# Patient Record
Sex: Male | Born: 1945
Health system: Southern US, Community
[De-identification: ages and names within clinical notes are randomized; demographics above are authoritative.]

## PROBLEM LIST (undated history)

## (undated) DIAGNOSIS — I10 Essential (primary) hypertension: Secondary | ICD-10-CM

## (undated) HISTORY — DX: Essential (primary) hypertension: I10

## (undated) HISTORY — PX: HERNIA REPAIR: SHX51

## (undated) HISTORY — PX: VASECTOMY: SHX75

---

## 2005-11-02 ENCOUNTER — Ambulatory Visit: Payer: Self-pay | Admitting: Ophthalmology

## 2006-07-04 ENCOUNTER — Emergency Department: Payer: Self-pay | Admitting: Emergency Medicine

## 2008-03-04 ENCOUNTER — Emergency Department: Payer: Self-pay | Admitting: Unknown Physician Specialty

## 2009-05-20 ENCOUNTER — Ambulatory Visit: Payer: Self-pay | Admitting: Ophthalmology

## 2009-06-01 ENCOUNTER — Ambulatory Visit: Payer: Self-pay | Admitting: Ophthalmology

## 2010-09-22 ENCOUNTER — Emergency Department: Payer: Self-pay | Admitting: Emergency Medicine

## 2013-07-12 DIAGNOSIS — I10 Essential (primary) hypertension: Secondary | ICD-10-CM | POA: Diagnosis not present

## 2013-08-30 DIAGNOSIS — N182 Chronic kidney disease, stage 2 (mild): Secondary | ICD-10-CM | POA: Diagnosis not present

## 2013-08-30 DIAGNOSIS — I129 Hypertensive chronic kidney disease with stage 1 through stage 4 chronic kidney disease, or unspecified chronic kidney disease: Secondary | ICD-10-CM | POA: Diagnosis not present

## 2013-11-13 DIAGNOSIS — I10 Essential (primary) hypertension: Secondary | ICD-10-CM | POA: Diagnosis not present

## 2014-04-28 DIAGNOSIS — K469 Unspecified abdominal hernia without obstruction or gangrene: Secondary | ICD-10-CM | POA: Diagnosis not present

## 2014-05-22 DIAGNOSIS — K4020 Bilateral inguinal hernia, without obstruction or gangrene, not specified as recurrent: Secondary | ICD-10-CM | POA: Diagnosis not present

## 2014-06-02 ENCOUNTER — Ambulatory Visit: Payer: Self-pay | Admitting: Unknown Physician Specialty

## 2014-06-02 DIAGNOSIS — K4090 Unilateral inguinal hernia, without obstruction or gangrene, not specified as recurrent: Secondary | ICD-10-CM | POA: Diagnosis not present

## 2014-06-02 DIAGNOSIS — Z01810 Encounter for preprocedural cardiovascular examination: Secondary | ICD-10-CM | POA: Diagnosis not present

## 2014-06-02 DIAGNOSIS — I10 Essential (primary) hypertension: Secondary | ICD-10-CM | POA: Diagnosis not present

## 2014-06-04 DIAGNOSIS — I517 Cardiomegaly: Secondary | ICD-10-CM | POA: Diagnosis not present

## 2014-06-04 DIAGNOSIS — M720 Palmar fascial fibromatosis [Dupuytren]: Secondary | ICD-10-CM | POA: Diagnosis not present

## 2014-06-04 DIAGNOSIS — R9431 Abnormal electrocardiogram [ECG] [EKG]: Secondary | ICD-10-CM | POA: Diagnosis not present

## 2014-06-04 DIAGNOSIS — R58 Hemorrhage, not elsewhere classified: Secondary | ICD-10-CM | POA: Diagnosis not present

## 2014-06-04 DIAGNOSIS — I10 Essential (primary) hypertension: Secondary | ICD-10-CM | POA: Diagnosis not present

## 2014-06-04 DIAGNOSIS — F17200 Nicotine dependence, unspecified, uncomplicated: Secondary | ICD-10-CM | POA: Diagnosis not present

## 2014-06-04 DIAGNOSIS — K4030 Unilateral inguinal hernia, with obstruction, without gangrene, not specified as recurrent: Secondary | ICD-10-CM | POA: Diagnosis not present

## 2014-06-04 DIAGNOSIS — L538 Other specified erythematous conditions: Secondary | ICD-10-CM | POA: Diagnosis not present

## 2014-06-05 DIAGNOSIS — I10 Essential (primary) hypertension: Secondary | ICD-10-CM | POA: Diagnosis not present

## 2014-06-05 DIAGNOSIS — R58 Hemorrhage, not elsewhere classified: Secondary | ICD-10-CM | POA: Diagnosis not present

## 2014-06-05 DIAGNOSIS — R0602 Shortness of breath: Secondary | ICD-10-CM | POA: Diagnosis not present

## 2014-06-05 DIAGNOSIS — M720 Palmar fascial fibromatosis [Dupuytren]: Secondary | ICD-10-CM | POA: Diagnosis not present

## 2014-06-05 DIAGNOSIS — I517 Cardiomegaly: Secondary | ICD-10-CM | POA: Diagnosis not present

## 2014-06-05 DIAGNOSIS — F17200 Nicotine dependence, unspecified, uncomplicated: Secondary | ICD-10-CM | POA: Diagnosis not present

## 2014-06-05 DIAGNOSIS — L538 Other specified erythematous conditions: Secondary | ICD-10-CM | POA: Diagnosis not present

## 2014-06-05 DIAGNOSIS — R9431 Abnormal electrocardiogram [ECG] [EKG]: Secondary | ICD-10-CM | POA: Diagnosis not present

## 2014-07-07 ENCOUNTER — Ambulatory Visit: Payer: Self-pay | Admitting: Surgery

## 2014-07-07 DIAGNOSIS — Z9852 Vasectomy status: Secondary | ICD-10-CM | POA: Diagnosis not present

## 2014-07-07 DIAGNOSIS — Z9841 Cataract extraction status, right eye: Secondary | ICD-10-CM | POA: Diagnosis not present

## 2014-07-07 DIAGNOSIS — F17200 Nicotine dependence, unspecified, uncomplicated: Secondary | ICD-10-CM | POA: Diagnosis not present

## 2014-07-07 DIAGNOSIS — I10 Essential (primary) hypertension: Secondary | ICD-10-CM | POA: Diagnosis not present

## 2014-07-07 DIAGNOSIS — Z9842 Cataract extraction status, left eye: Secondary | ICD-10-CM | POA: Diagnosis not present

## 2014-07-07 DIAGNOSIS — Z79899 Other long term (current) drug therapy: Secondary | ICD-10-CM | POA: Diagnosis not present

## 2014-07-07 DIAGNOSIS — Z7982 Long term (current) use of aspirin: Secondary | ICD-10-CM | POA: Diagnosis not present

## 2014-07-07 DIAGNOSIS — K4090 Unilateral inguinal hernia, without obstruction or gangrene, not specified as recurrent: Secondary | ICD-10-CM | POA: Diagnosis not present

## 2014-09-14 NOTE — Op Note (Signed)
PATIENT NAME:  Brandon Hart, Brandon Hart MR#:  [id number] DATE OF BIRTH:  14-Jun-1945  DATE OF PROCEDURE:  07/07/2014  PREOPERATIVE DIAGNOSIS: Left inguinal hernia.   POSTOPERATIVE DIAGNOSIS: Left inguinal hernia (indirect with very weak inguinal floor).   OPERATION PERFORMED: Left inguinal hernia repair Karl Pock) with mesh.   SURGEON: Consuela Mimes, M.D.   ANESTHESIA: General.   PROCEDURE IN DETAIL: The patient was placed supine on the Operating Room table and prepped and draped in the usual sterile fashion. An incision was made over the inguinal canal in the lines of the skin on the left side and carried down through Scarpa's fascia to the external oblique aponeurosis which was opened in the direction of its fibers to the external inguinal ring. The spermatic cord was elevated and incised proximally and a fairly long indirect hernia sac was located and dissected free of surrounding cord structures to a level very high in the inguinal canal. It was dunked back within the abdominal wall. A regular size Bard polypropylene precut keyhole mesh was then placed in the inguinal floor and secured there with a running suture line of 2-0 Prolene to the shelving edge of Poupart's ligament laterally and multiple interrupted sutures were placed medially to the edge of the conjoined tendon and an internal oblique musculature and fascia medially. This completely contained the very weak inguinal floor that was beginning to become a direct inguinal hernia. The tails of the mesh lay flat and crossed superolateral to the new deep ring (keyhole) underneath the external oblique aponeurosis and the toe of the mesh overlapped the pubic tubercle without sutures. The spermatic cord was replaced in its anatomic position and the external oblique aponeurosis was closed with a running 3-0 Monocryl suture. Interrupted 3-0 Monocryl was replaced in Scarpa's fascia and the skin was reapproximated with an Insorb stapler and 1 inch  suture strip. The patient tolerated the procedure well. There were no complications.    ____________________________ Consuela Mimes, MD wfm:mc D: 07/07/2014 08:48:48 ET T: 07/07/2014 09:40:47 ET JOB#: [id number]  cc: Consuela Mimes, MD, <Dictator> Consuela Mimes MD ELECTRONICALLY SIGNED 07/07/2014 16:38

## 2014-12-03 ENCOUNTER — Other Ambulatory Visit: Payer: Self-pay | Admitting: Unknown Physician Specialty

## 2014-12-03 NOTE — Telephone Encounter (Signed)
Needs seen further refills 

## 2015-02-02 ENCOUNTER — Other Ambulatory Visit: Payer: Self-pay

## 2015-02-02 MED ORDER — LISINOPRIL 20 MG PO TABS: 20.0000 mg | ORAL_TABLET | Freq: Every day | ORAL | Status: DC

## 2015-02-02 NOTE — Telephone Encounter (Signed)
Needs seen further refills 

## 2015-02-02 NOTE — Telephone Encounter (Signed)
Patient was last seen 06/04/14, practice partner number is [phone], and pharmacy is [place] [facility].

## 2015-04-01 ENCOUNTER — Other Ambulatory Visit: Payer: Self-pay | Admitting: Unknown Physician Specialty

## 2015-04-01 NOTE — Telephone Encounter (Signed)
Pt needs a medicare wellness exam

## 2015-04-27 ENCOUNTER — Other Ambulatory Visit: Payer: Self-pay

## 2015-04-27 DIAGNOSIS — I10 Essential (primary) hypertension: Secondary | ICD-10-CM | POA: Insufficient documentation

## 2015-04-27 DIAGNOSIS — E1159 Type 2 diabetes mellitus with other circulatory complications: Secondary | ICD-10-CM | POA: Insufficient documentation

## 2015-04-27 MED ORDER — LISINOPRIL 20 MG PO TABS: 20.0000 mg | ORAL_TABLET | Freq: Every day | ORAL | Status: DC

## 2015-04-27 NOTE — Telephone Encounter (Signed)
Pt needs check further refills 

## 2015-04-27 NOTE — Telephone Encounter (Signed)
Patient is due for Medicare Wellness visit per [place]. I called and left the patient a voicemail asking for him to please return my call to schedule a Medicare wellness check and refills. [name] please route this message back to me so I can call him again to try to schedule him an appointment.

## 2015-04-28 ENCOUNTER — Encounter: Payer: Self-pay | Admitting: Unknown Physician Specialty

## 2015-04-28 ENCOUNTER — Ambulatory Visit (INDEPENDENT_AMBULATORY_CARE_PROVIDER_SITE_OTHER): Payer: Medicare Other | Admitting: Unknown Physician Specialty

## 2015-04-28 VITALS — BP 172/108 | HR 74 | Temp 98.6°F | Ht 68.9 in | Wt 189.0 lb

## 2015-04-28 DIAGNOSIS — I10 Essential (primary) hypertension: Secondary | ICD-10-CM

## 2015-04-28 DIAGNOSIS — F17210 Nicotine dependence, cigarettes, uncomplicated: Secondary | ICD-10-CM | POA: Insufficient documentation

## 2015-04-28 DIAGNOSIS — Z0000 Encounter for general adult medical examination without abnormal findings: Secondary | ICD-10-CM

## 2015-04-28 DIAGNOSIS — F17200 Nicotine dependence, unspecified, uncomplicated: Secondary | ICD-10-CM

## 2015-04-28 LAB — MICROALBUMIN, URINE WAIVED
CREATININE, URINE WAIVED: 100 mg/dL (ref 10–300)
Microalb, Ur Waived: 30 mg/L — ABNORMAL HIGH (ref 0–19)

## 2015-04-28 MED ORDER — LISINOPRIL-HYDROCHLOROTHIAZIDE 20-25 MG PO TABS: 1.0000 | ORAL_TABLET | Freq: Every day | ORAL | Status: DC

## 2015-04-28 NOTE — Progress Notes (Signed)
BP 172/108 mmHg  Pulse 74  Temp(Src) 98.6 F (37 C)  Ht 5' 8.9" (1.75 m)  Wt 189 lb (85.73 kg)  BMI 27.99 kg/m2  SpO2 95%   Subjective:    Patient ID: Brandon Hart, male    DOB: [birth year], 69 y.o.   MRN: [id number]  HPI: Brandon Hart is a 69 y.o. male  Chief Complaint  Patient presents with  . Medicare Wellness    pt states he has been dreaming a lot lately, "stressful dreaming"   Hypertension Using medications without difficulty.  Not taking HCTZ and lost to f/u Average home BP not checking  No problems or lightheadedness No chest pain with exertion or shortness of breath No Edema  Functional Status Survey: Is the patient deaf or have difficulty hearing?: No Does the patient have difficulty seeing, even when wearing glasses/contacts?: No (states he wears reading glasses) Does the patient have difficulty concentrating, remembering, or making decisions?: No Does the patient have difficulty walking or climbing stairs?: No Does the patient have difficulty dressing or bathing?: No Does the patient have difficulty doing errands alone such as visiting a doctor's office or shopping?: No  Fall Risk  04/28/2015  Falls in the past year? No   Depression screen PHQ 2/9 04/28/2015  Decreased Interest 0  Down, Depressed, Hopeless 0  PHQ - 2 Score 0      Tobacco dependence Smokes 1 ppd.    Relevant past medical, surgical, family and social history reviewed and updated as indicated. Interim medical history since our last visit reviewed. Allergies and medications reviewed and updated.  Review of Systems  Constitutional: Negative.   HENT: Negative.   Eyes: Negative.   Respiratory: Negative.   Cardiovascular: Negative.   Gastrointestinal: Negative.   Endocrine: Negative.   Genitourinary: Negative.   Skin: Negative.   Allergic/Immunologic: Negative.   Neurological: Negative.   Hematological: Negative.   Psychiatric/Behavioral: Negative.     Per  HPI unless specifically indicated above     Objective:    BP 172/108 mmHg  Pulse 74  Temp(Src) 98.6 F (37 C)  Ht 5' 8.9" (1.75 m)  Wt 189 lb (85.73 kg)  BMI 27.99 kg/m2  SpO2 95%  Wt Readings from Last 3 Encounters:  04/28/15 189 lb (85.73 kg)  06/04/14 178 lb (80.74 kg)    Physical Exam  Constitutional: He is oriented to person, place, and time. He appears well-developed and well-nourished.  HENT:  Head: Normocephalic.  Right Ear: Tympanic membrane, external ear and ear canal normal.  Left Ear: Tympanic membrane, external ear and ear canal normal.  Mouth/Throat: Uvula is midline, oropharynx is clear and moist and mucous membranes are normal.  Eyes: Pupils are equal, round, and reactive to light.  Cardiovascular: Normal rate, regular rhythm and normal heart sounds.  Exam reveals no gallop and no friction rub.   No murmur heard. Pulmonary/Chest: Effort normal and breath sounds normal. No respiratory distress.  Abdominal: Soft. Bowel sounds are normal. He exhibits no distension. There is no tenderness.  Musculoskeletal: Normal range of motion.  Neurological: He is alert and oriented to person, place, and time. He has normal reflexes.  Skin: Skin is warm and dry.  Psychiatric: He has a normal mood and affect. His behavior is normal. Judgment and thought content normal.    No results found for this or any previous visit.    Assessment & Plan:   Problem List Items Addressed This Visit      Unprioritized  Hypertension    Poor control.  Add HCTZ back      Relevant Medications   lisinopril-hydrochlorothiazide (PRINZIDE,ZESTORETIC) 20-25 MG tablet   Tobacco dependence - Primary    Other Visit Diagnoses    Annual physical exam            Follow up plan: Return in about 4 weeks (around 05/26/2015) for BP and Spirometry.

## 2015-04-28 NOTE — Patient Instructions (Signed)
Tobacco Use Disorder Tobacco use disorder (TUD) is a mental disorder. It is the long-term use of tobacco in spite of related health problems or difficulty with normal life activities. Tobacco is most commonly smoked as cigarettes and less commonly as cigars or pipes. Smokeless chewing tobacco and snuff are also popular. People with TUD get a feeling of extreme pleasure (euphoria) from using tobacco and have a desire to use it again and again. Repeated use of tobacco can cause problems. The addictive effects of tobacco are due mainly tothe ingredient nicotine. Nicotine also causes a rush of adrenaline (epinephrine) in the body. This leads to increased blood pressure, heart rate, and breathing rate. These changes may cause problems for people with high blood pressure, weak hearts, or lung disease. High doses of nicotine in children and pets can lead to seizures and death.  Tobacco contains a number of other unsafe chemicals. These chemicals are especially harmful when inhaled as smoke and can damage almost every organ in the body. Smokers live shorter lives than nonsmokers and are at risk of dying from a number of diseases and cancers. Tobacco smoke can also cause health problems for nonsmokers (due to inhaling secondhand smoke). Smoking is also a fire hazard.  TUD usually starts in the late teenage years and is most common in young adults between the ages of 18 and 25 years. People who start smoking earlier in life are more likely to continue smoking as adults. TUD is somewhat more common in men than women. People with TUD are at higher risk for using alcohol and other drugs of abuse. RISK FACTORS Risk factors for TUD include:   Having family members with the disorder.  Being around people who use tobacco.  Having an existing mental health issue such as schizophrenia, depression, bipolar disorder, ADHD, or posttraumatic stress disorder (PTSD). SIGNS AND SYMPTOMS  People with tobacco use disorder have  two or more of the following signs and symptoms within 12 months:   Use of more tobacco over a longer period than intended.   Not able to cut down or control tobacco use.   A lot of time spent obtaining or using tobacco.   Strong desire or urge to use tobacco (craving). Cravings may last for 6 months or longer after quitting.  Use of tobacco even when use leads to major problems at work, school, or home.   Use of tobacco even when use leads to relationship problems.   Giving up or cutting down on important life activities because of tobacco use.   Repeatedly using tobacco in situations where it puts you or others in physical danger, like smoking in bed.   Use of tobacco even when it is known that a physical or mental problem is likely related to tobacco use.   Physical problems are numerous and may include chronic bronchitis, emphysema, lung and other cancers, gum disease, high blood pressure, heart disease, and stroke.   Mental problems caused by tobacco may include difficulty sleeping and anxiety.  Need to use greater amounts of tobacco to get the same effect. This means you have developed a tolerance.   Withdrawal symptoms as a result of stopping or rapidly cutting back use. These symptoms may last a month or more after quitting and include the following:   Depressed, anxious, or irritable mood.   Difficulty concentrating.   Increased appetite.  Restlessness or trouble sleeping.   Use of tobacco to avoid withdrawal symptoms. DIAGNOSIS  Tobacco use disorder is diagnosed by   your health care provider. A diagnosis may be made by:  Your health care provider asking questions about your tobacco use and any problems it may be causing.  A physical exam.  Lab tests.  You may be referred to a mental health professional or addiction specialist. The severity of tobacco use disorder depends on the number of signs and symptoms you have:   Mild--Two or three  symptoms.  Moderate--Four or five symptoms.   Severe--Six or more symptoms.  TREATMENT  Many people with tobacco use disorder are unable to quit on their own and need help. Treatment options include the following:  Nicotine replacement therapy (NRT). NRT provides nicotine without the other harmful chemicals in tobacco. NRT gradually lowers the dosage of nicotine in the body and reduces withdrawal symptoms. NRT is available in over-the-counter forms (gum, lozenges, and skin patches) as well as prescription forms (mouth inhaler and nasal spray).  Medicines.This may include:  Antidepressant medicine that may reduce nicotine cravings.  A medicine that acts on nicotine receptors in the brain to reduce cravings and withdrawal symptoms. It may also block the effects of tobacco in people with TUD who relapse.  Counseling or talk therapy. A form of talk therapy called behavioral therapy is commonly used to treat people with TUD. Behavioral therapy looks at triggers for tobacco use, how to avoid them, and how to cope with cravings. It is most effective in person or by phone but is also available in self-help forms (books and Internet websites).  Support groups. These provide emotional support, advice, and guidance for quitting tobacco. The most effective treatment for TUD is usually a combination of medicine, talk therapy, and support groups. HOME CARE INSTRUCTIONS  Keep all follow-up visits as directed by your health care provider. This is important.  Take medicines only as directed by your health care provider.  Check with your health care provider before starting new prescription or over-the-counter medicines. SEEK MEDICAL CARE IF:  You are not able to take your medicines as prescribed.  Treatment is not helping your TUD and your symptoms get worse. SEEK IMMEDIATE MEDICAL CARE IF:  You have serious thoughts about hurting yourself or others.  You have trouble breathing, chest pain,  sudden weakness, or sudden numbness in part of your body.   This information is not intended to replace advice given to you by your health care provider. Make sure you discuss any questions you have with your health care provider.   Document Released: 01/06/2004 Document Revised: 05/23/2014 Document Reviewed: 06/28/2013 Elsevier Interactive Patient Education 2016 Elsevier Inc.  

## 2015-04-28 NOTE — Assessment & Plan Note (Signed)
Poor control.  Add HCTZ back

## 2015-04-28 NOTE — Telephone Encounter (Signed)
Patient has appointment with Dr. Jeananne Hart today for med f/u. I asked for Brandon Hart to please schedule him a medicare wellness visit with Brandon Hart when he checks out today.

## 2015-04-29 ENCOUNTER — Telehealth: Payer: Self-pay | Admitting: Family Medicine

## 2015-04-29 ENCOUNTER — Other Ambulatory Visit: Payer: Self-pay | Admitting: Unknown Physician Specialty

## 2015-04-29 DIAGNOSIS — E785 Hyperlipidemia, unspecified: Secondary | ICD-10-CM

## 2015-04-29 LAB — COMPREHENSIVE METABOLIC PANEL
A/G RATIO: 1.8 (ref 1.1–2.5)
ALT: 17 IU/L (ref 0–44)
AST: 16 IU/L (ref 0–40)
Albumin: 4.2 g/dL (ref 3.6–4.8)
Alkaline Phosphatase: 99 IU/L (ref 39–117)
BUN/Creatinine Ratio: 18 (ref 10–22)
BUN: 15 mg/dL (ref 8–27)
CALCIUM: 9.3 mg/dL (ref 8.6–10.2)
CHLORIDE: 99 mmol/L (ref 96–106)
CO2: 27 mmol/L (ref 18–29)
Creatinine, Ser: 0.85 mg/dL (ref 0.76–1.27)
GFR, EST AFRICAN AMERICAN: 104 mL/min/{1.73_m2} (ref >59)
GFR, EST NON AFRICAN AMERICAN: 90 mL/min/{1.73_m2} (ref >59)
GLOBULIN, TOTAL: 2.4 g/dL (ref 1.5–4.5)
Glucose: 100 mg/dL — ABNORMAL HIGH (ref 65–99)
POTASSIUM: 4.3 mmol/L (ref 3.5–5.2)
SODIUM: 140 mmol/L (ref 134–144)
TOTAL PROTEIN: 6.6 g/dL (ref 6.0–8.5)

## 2015-04-29 LAB — LIPID PANEL W/O CHOL/HDL RATIO
Cholesterol, Total: 212 mg/dL — ABNORMAL HIGH (ref 100–199)
HDL: 27 mg/dL — ABNORMAL LOW (ref >39)
LDL CALC: 105 mg/dL — AB (ref 0–99)
Triglycerides: 400 mg/dL — ABNORMAL HIGH (ref 0–149)
VLDL Cholesterol Cal: 80 mg/dL — ABNORMAL HIGH (ref 5–40)

## 2015-04-29 NOTE — Telephone Encounter (Signed)
Pt was told to sched fasting labs and he will be coming in tomorrow morning at 9

## 2015-04-30 ENCOUNTER — Encounter: Payer: Self-pay | Admitting: Unknown Physician Specialty

## 2015-04-30 ENCOUNTER — Other Ambulatory Visit: Payer: Medicare Other

## 2015-04-30 ENCOUNTER — Other Ambulatory Visit: Payer: Self-pay | Admitting: Unknown Physician Specialty

## 2015-04-30 DIAGNOSIS — E785 Hyperlipidemia, unspecified: Secondary | ICD-10-CM | POA: Diagnosis not present

## 2015-04-30 LAB — LIPID PANEL PICCOLO, WAIVED
Chol/HDL Ratio Piccolo,Waive: 5 mg/dL — ABNORMAL HIGH
Cholesterol Piccolo, Waived: 194 mg/dL (ref <200)
HDL Chol Piccolo, Waived: 38 mg/dL — ABNORMAL LOW (ref >59)
LDL Chol Calc Piccolo Waived: 119 mg/dL — ABNORMAL HIGH (ref <100)
Triglycerides Piccolo,Waived: 182 mg/dL — ABNORMAL HIGH (ref <150)
VLDL Chol Calc Piccolo,Waive: 36 mg/dL — ABNORMAL HIGH (ref <30)

## 2015-04-30 NOTE — Progress Notes (Signed)
Face to face discussion with patient about cholesterol  ASCVD calculator reviewed in which a moderate to high intensity statin is recommended.  Pt refusing for now but will think about it.

## 2015-05-27 ENCOUNTER — Ambulatory Visit (INDEPENDENT_AMBULATORY_CARE_PROVIDER_SITE_OTHER): Payer: Medicare Other | Admitting: Unknown Physician Specialty

## 2015-05-27 ENCOUNTER — Encounter: Payer: Self-pay | Admitting: Unknown Physician Specialty

## 2015-05-27 VITALS — BP 151/87 | HR 98 | Temp 98.0°F | Ht 68.0 in | Wt 188.8 lb

## 2015-05-27 DIAGNOSIS — R059 Cough, unspecified: Secondary | ICD-10-CM

## 2015-05-27 DIAGNOSIS — J449 Chronic obstructive pulmonary disease, unspecified: Secondary | ICD-10-CM | POA: Diagnosis not present

## 2015-05-27 DIAGNOSIS — F17200 Nicotine dependence, unspecified, uncomplicated: Secondary | ICD-10-CM

## 2015-05-27 DIAGNOSIS — I10 Essential (primary) hypertension: Secondary | ICD-10-CM | POA: Diagnosis not present

## 2015-05-27 DIAGNOSIS — R05 Cough: Secondary | ICD-10-CM | POA: Diagnosis not present

## 2015-05-27 DIAGNOSIS — J432 Centrilobular emphysema: Secondary | ICD-10-CM | POA: Insufficient documentation

## 2015-05-27 NOTE — Assessment & Plan Note (Signed)
Refused low dose CT scan.  Information given.  Encouraged to quit smoking

## 2015-05-27 NOTE — Progress Notes (Signed)
   BP 151/87 mmHg  Pulse 98  Temp(Src) 98 F (36.7 C)  Ht 5\' 8"  (1.727 m)  Wt 188 lb 12.8 oz (85.639 kg)  BMI 28.71 kg/m2  SpO2 97%   Subjective:    Patient ID: Brandon Hart, male    DOB: 1945-09-27, 70 y.o.   MRN: [id number]  HPI: Brandon Hart is a 71 y.o. male  Chief Complaint  Patient presents with  . Hypertension    pt is here for 4 week f/u on BP   Hypertension Using medications without difficulty Average home BPs   No problems or lightheadedness No chest pain with exertion or shortness of breath No Edema  Cough 4 week history of cough.  Pt a smoker for many years.  Denies SOB   Relevant past medical, surgical, family and social history reviewed and updated as indicated. Interim medical history since our last visit reviewed. Allergies and medications reviewed and updated.  Review of Systems  Per HPI unless specifically indicated above     Objective:    BP 151/87 mmHg  Pulse 98  Temp(Src) 98 F (36.7 C)  Ht 5\' 8"  (1.727 m)  Wt 188 lb 12.8 oz (85.639 kg)  BMI 28.71 kg/m2  SpO2 97%  Wt Readings from Last 3 Encounters:  05/27/15 188 lb 12.8 oz (85.639 kg)  04/28/15 189 lb (85.73 kg)  06/04/14 178 lb (80.74 kg)    Physical Exam  Constitutional: He is oriented to person, place, and time. He appears well-developed and well-nourished. No distress.  HENT:  Head: Normocephalic and atraumatic.  Eyes: Conjunctivae and lids are normal. Right eye exhibits no discharge. Left eye exhibits no discharge. No scleral icterus.  Neck: Normal range of motion. Neck supple. No JVD present. Carotid bruit is not present.  Cardiovascular: Normal rate, regular rhythm and normal heart sounds.   Pulmonary/Chest: Effort normal and breath sounds normal. No respiratory distress.  Abdominal: Normal appearance. There is no splenomegaly or hepatomegaly.  Musculoskeletal: Normal range of motion.  Neurological: He is alert and oriented to person, place, and time.   Skin: Skin is warm, dry and intact. No rash noted. No pallor.  Psychiatric: He has a normal mood and affect. His behavior is normal. Judgment and thought content normal.   S[irometry shows non-reversible constriction  Results for orders placed or performed in visit on 04/30/15  Lipid Panel Piccolo, [employer]  Result Value Ref Range   Cholesterol Piccolo, Waived 194 <200 mg/dL   HDL Chol Piccolo, Waived 38 (L) >59 mg/dL   Triglycerides Piccolo,Waived 182 (H) <150 mg/dL   Chol/HDL Ratio Piccolo,Waive 5.0 (H) mg/dL   LDL Chol Calc Piccolo Waived 119 (H) <100 mg/dL   VLDL Chol Calc Piccolo,Waive 36 (H) <30 mg/dL      Assessment & Plan:   Problem List Items Addressed This Visit      Unprioritized   Hypertension    Much improved.  Not yet to goal.        Tobacco dependence - Primary    Refused low dose CT scan.  Information given.  Encouraged to quit smoking      COPD, mild ([place])    Encouraged to quit smoking       Other Visit Diagnoses    Cough           Offered low dose CT screening and refused.    Follow up plan: Return in about 6 months (around 11/24/2015).

## 2015-05-27 NOTE — Assessment & Plan Note (Signed)
Encouraged to quit smoking.  

## 2015-05-27 NOTE — Patient Instructions (Addendum)
Advance Directive Advance directives are the legal documents that allow you to make choices about your health care and medical treatment if you cannot speak for yourself. Advance directives are a way for you to communicate your wishes to family, friends, and health care providers. The specified people can then convey your decisions about end-of-life care to avoid confusion if you should become unable to communicate. Ideally, the process of discussing and writing advance directives should happen over time rather than making decisions all at once. Advance directives can be modified as your situation changes, and you can change your mind at any time, even after you have signed the advance directives. Each state has its own laws regarding advance directives. You may want to check with your health care provider, attorney, or state representative about the law in your state. Below are some examples of advance directives. LIVING WILL A living will is a set of instructions documenting your wishes about medical care when you cannot care for yourself. It is used if you become:  Terminally ill.  Incapacitated.  Unable to communicate.  Unable to make decisions. Items to consider in your living will include:  The use or non-use of life-sustaining equipment, such as dialysis machines and breathing machines (ventilators).  A do not resuscitate (DNR) order, which is the instruction not to use cardiopulmonary resuscitation (CPR) if breathing or heartbeat stops.  Tube feeding.  Withholding of food and fluids.  Comfort (palliative) care when the goal becomes comfort rather than a cure.  Organ and tissue donation. A living will does not give instructions about distribution of your money and property if you should pass away. It is advisable to seek the expert advice of a lawyer in drawing up a will regarding your possessions. Decisions about taxes, beneficiaries, and asset distribution will be legally binding.  This process can relieve your family and friends of any burdens surrounding disputes or questions that may come up about the allocation of your assets. DO NOT RESUSCITATE (DNR) A do not resuscitate (DNR) order is a request to not have CPR in the event that your heart stops beating or you stop breathing. Unless given other instructions, a health care provider will try to help any patient whose heart has stopped or who has stopped breathing.  HEALTH CARE PROXY AND DURABLE POWER OF ATTORNEY FOR HEALTH CARE A health care proxy is a person (agent) appointed to make medical decisions for you if you cannot. Generally, people choose someone they know well and trust to represent their preferences when they can no longer do so. You should be sure to ask this person for agreement to act as your agent. An agent may have to exercise judgment in the event of a medical decision for which your wishes are not known. The durable power of attorney for health care is the legal document that names your health care proxy. Once written, it should be:  Signed.  Notarized.  Dated.  Copied.  Witnessed.  Incorporated into your medical record. You may also want to appoint someone to manage your financial affairs if you cannot. This is called a durable power of attorney for finances. It is a separate legal document from the durable power of attorney for health care. You may choose the same person or someone different from your health care proxy to act as your agent in financial matters.   This information is not intended to replace advice given to you by your health care provider. Make sure you discuss any   questions you have with your health care provider.   Document Released: 08/09/2007 Document Revised: 05/07/2013 Document Reviewed: 09/19/2012 Elsevier Interactive Patient Education 2016 Elsevier Inc.  

## 2015-05-27 NOTE — Assessment & Plan Note (Signed)
Much improved.  Not yet to goal.

## 2015-07-23 ENCOUNTER — Encounter: Payer: Self-pay | Admitting: *Deleted

## 2015-07-24 ENCOUNTER — Other Ambulatory Visit: Payer: Self-pay | Admitting: Unknown Physician Specialty

## 2015-08-10 ENCOUNTER — Ambulatory Visit
Admission: RE | Admit: 2015-08-10 | Discharge: 2015-08-10 | Disposition: A | Discharge status: Home / Self Care | Payer: Medicare Other | Source: Ambulatory Visit | Attending: Unknown Physician Specialty | Admitting: Unknown Physician Specialty

## 2015-08-10 ENCOUNTER — Ambulatory Visit (INDEPENDENT_AMBULATORY_CARE_PROVIDER_SITE_OTHER): Payer: Medicare Other | Admitting: Unknown Physician Specialty

## 2015-08-10 ENCOUNTER — Other Ambulatory Visit: Payer: Self-pay | Admitting: Unknown Physician Specialty

## 2015-08-10 ENCOUNTER — Encounter: Payer: Self-pay | Admitting: Unknown Physician Specialty

## 2015-08-10 VITALS — BP 119/78 | HR 106 | Temp 98.2°F | Ht 68.3 in | Wt 184.4 lb

## 2015-08-10 DIAGNOSIS — R634 Abnormal weight loss: Secondary | ICD-10-CM

## 2015-08-10 DIAGNOSIS — R5382 Chronic fatigue, unspecified: Secondary | ICD-10-CM

## 2015-08-10 DIAGNOSIS — R5383 Other fatigue: Secondary | ICD-10-CM

## 2015-08-10 DIAGNOSIS — R059 Cough, unspecified: Secondary | ICD-10-CM

## 2015-08-10 DIAGNOSIS — G4700 Insomnia, unspecified: Secondary | ICD-10-CM | POA: Diagnosis not present

## 2015-08-10 DIAGNOSIS — Z0000 Encounter for general adult medical examination without abnormal findings: Secondary | ICD-10-CM | POA: Diagnosis not present

## 2015-08-10 DIAGNOSIS — R918 Other nonspecific abnormal finding of lung field: Secondary | ICD-10-CM | POA: Diagnosis not present

## 2015-08-10 DIAGNOSIS — F17200 Nicotine dependence, unspecified, uncomplicated: Secondary | ICD-10-CM

## 2015-08-10 DIAGNOSIS — R9389 Abnormal findings on diagnostic imaging of other specified body structures: Secondary | ICD-10-CM

## 2015-08-10 DIAGNOSIS — R05 Cough: Secondary | ICD-10-CM

## 2015-08-10 DIAGNOSIS — J449 Chronic obstructive pulmonary disease, unspecified: Secondary | ICD-10-CM | POA: Insufficient documentation

## 2015-08-10 MED ORDER — DOXYCYCLINE HYCLATE 100 MG PO TABS: 100.0000 mg | ORAL_TABLET | Freq: Two times a day (BID) | ORAL | Status: DC

## 2015-08-10 NOTE — Progress Notes (Signed)
Reviewed chest x-ray.  Called in Doxycycline for possible pneumonia.  However, pt is not ill.  I will order a CT scan of his chest

## 2015-08-10 NOTE — Progress Notes (Signed)
+++++++++++++++++++++++++++++++++++++++++++++++++++++++++++++++++++++++++++++++++++++++++++++++++++++++++++++++++++++++++++++++++++++++++++++++++++++++++++++++++++++++++++++++++++++++++++++++++++++++++++++++++++++++++++++++++++++++++++++++++++++++++++++++++++++++++++++++++++++++++++++++++++++++++++++++++++++++++++++++++++++++++++++++++++++  BP 119/78 mmHg  Pulse 106  Temp(Src) 98.2 F (36.8 C)  Ht 5' 8.3" (1.735 m)  Wt 184 lb 6.4 oz (83.643 kg)  BMI 27.79 kg/m2  SpO2 97%   Subjective:    Patient ID: Brandon Hart, male    DOB: 1945/08/06, 70 y.o.   MRN: [id number]  HPI: Brandon Hart is a 70 y.o. male  Chief Complaint  Patient presents with  . Fatigue    pt states he feels tired all the time  . Insomnia    pt states he dreams almost every night and wakes up feeling tired  . no appetite   Insomnia: States he dreams all nigh, wakes up 2-3 times/night but goes back to sleep.  Often wakes up tired.    Loss of appetite: States this comes and goes.  4 pound weight loss since last visit  Fatigue: feels lights headed.  Feels tired but "comes in spurts."  Feels better if he walks. Smokes 1 ppd since the age of 45.  Last spirometry showing no COPD.    Dry cough at night  Relevant past medical, surgical, family and social history reviewed and updated as indicated. Interim medical history since our last visit reviewed. Allergies and medications reviewed and updated.  Review of Systems  Per HPI unless specifically indicated above     Objective:    BP 119/78 mmHg  Pulse 106  Temp(Src) 98.2 F (36.8 C)  Ht 5' 8.3" (1.735 m)  Wt 184 lb 6.4 oz (83.643 kg)  BMI 27.79 kg/m2  SpO2 97%  Wt Readings from Last 3 Encounters:  08/10/15 184 lb 6.4 oz (83.643 kg)  05/27/15 188 lb 12.8 oz (85.639 kg)  04/28/15 189 lb (85.73 kg)    Physical Exam  Constitutional: He is oriented to person, place, and time. He appears well-developed and well-nourished. No distress.  HENT:   Head: Normocephalic and atraumatic.  Eyes: Conjunctivae and lids are normal. Right eye exhibits no discharge. Left eye exhibits no discharge. No scleral icterus.  Neck: Normal range of motion. Neck supple. No JVD present. Carotid bruit is not present.  Cardiovascular: Normal rate, regular rhythm and normal heart sounds.   Pulmonary/Chest: Effort normal and breath sounds normal. No respiratory distress.  Abdominal: Normal appearance. There is no splenomegaly or hepatomegaly.  Musculoskeletal: Normal range of motion.  Neurological: He is alert and oriented to person, place, and time.  Skin: Skin is warm, dry and intact. No rash noted. No pallor.  Psychiatric: He has a normal mood and affect. His behavior is normal. Judgment and thought content normal.    Results for orders placed or performed in visit on 04/30/15  Lipid Panel Piccolo, [employer]  Result Value Ref Range   Cholesterol Piccolo, Waived 194 <200 mg/dL   HDL Chol Piccolo, Waived 38 (L) >59 mg/dL   Triglycerides Piccolo,Waived 182 (H) <150 mg/dL   Chol/HDL Ratio Piccolo,Waive 5.0 (H) mg/dL   LDL Chol Calc Piccolo Waived 119 (H) <100 mg/dL   VLDL Chol Calc Piccolo,Waive 36 (H) <30 mg/dL      Assessment & Plan:   Problem List Items Addressed This Visit    None    Visit Diagnoses    Other fatigue    -  Primary    Relevant Orders    CBC with Differential/Platelet    DG Chest 2 View    TSH    Testosterone    Weight loss        Insomnia  Cough        Relevant Orders    DG Chest 2 View    Routine general medical examination at a health care facility        Relevant Orders    Hepatitis C antibody        Follow up plan: Return if symptoms worsen or fail to improve.

## 2015-08-11 LAB — CBC WITH DIFFERENTIAL/PLATELET
BASOS ABS: 0 10*3/uL (ref 0.0–0.2)
Basos: 0 %
EOS (ABSOLUTE): 0.2 10*3/uL (ref 0.0–0.4)
Eos: 2 %
Hematocrit: 40.6 % (ref 37.5–51.0)
Hemoglobin: 13.5 g/dL (ref 12.6–17.7)
IMMATURE GRANS (ABS): 0.1 10*3/uL (ref 0.0–0.1)
Immature Granulocytes: 1 %
LYMPHS ABS: 2.4 10*3/uL (ref 0.7–3.1)
LYMPHS: 26 %
MCH: 29.2 pg (ref 26.6–33.0)
MCHC: 33.3 g/dL (ref 31.5–35.7)
MCV: 88 fL (ref 79–97)
Monocytes Absolute: 0.7 10*3/uL (ref 0.1–0.9)
Monocytes: 8 %
NEUTROS ABS: 5.8 10*3/uL (ref 1.4–7.0)
Neutrophils: 63 %
PLATELETS: 259 10*3/uL (ref 150–379)
RBC: 4.62 x10E6/uL (ref 4.14–5.80)
RDW: 13.7 % (ref 12.3–15.4)
WBC: 9.1 10*3/uL (ref 3.4–10.8)

## 2015-08-11 LAB — TESTOSTERONE: Testosterone: 312 ng/dL — ABNORMAL LOW (ref 348–1197)

## 2015-08-11 LAB — TSH: TSH: 2.33 u[IU]/mL (ref 0.450–4.500)

## 2015-08-11 LAB — HCV AB W REFLEX TO QUANT PCR: HCV Ab: <0.1 s/co ratio (ref 0.0–0.9)

## 2015-08-12 ENCOUNTER — Encounter: Payer: Self-pay | Admitting: Unknown Physician Specialty

## 2015-08-12 LAB — COMPREHENSIVE METABOLIC PANEL
ALBUMIN: 4.3 g/dL (ref 3.6–4.8)
ALT: 18 IU/L (ref 0–44)
AST: 17 IU/L (ref 0–40)
Albumin/Globulin Ratio: 1.8 (ref 1.2–2.2)
Alkaline Phosphatase: 105 IU/L (ref 39–117)
BILIRUBIN TOTAL: 0.2 mg/dL (ref 0.0–1.2)
BUN/Creatinine Ratio: 16 (ref 10–22)
BUN: 15 mg/dL (ref 8–27)
CALCIUM: 9.3 mg/dL (ref 8.6–10.2)
CHLORIDE: 96 mmol/L (ref 96–106)
CO2: 24 mmol/L (ref 18–29)
CREATININE: 0.93 mg/dL (ref 0.76–1.27)
GFR, EST AFRICAN AMERICAN: 98 mL/min/{1.73_m2} (ref >59)
GFR, EST NON AFRICAN AMERICAN: 85 mL/min/{1.73_m2} (ref >59)
Globulin, Total: 2.4 g/dL (ref 1.5–4.5)
Glucose: 115 mg/dL — ABNORMAL HIGH (ref 65–99)
Potassium: 4.1 mmol/L (ref 3.5–5.2)
Sodium: 138 mmol/L (ref 134–144)
Total Protein: 6.7 g/dL (ref 6.0–8.5)

## 2015-08-12 LAB — SPECIMEN STATUS REPORT

## 2015-08-12 NOTE — Progress Notes (Signed)
Quick Note:  Normal labs. Patient notified by letter. ______ 

## 2015-08-17 ENCOUNTER — Ambulatory Visit
Admission: RE | Admit: 2015-08-17 | Discharge: 2015-08-17 | Disposition: A | Discharge status: Home / Self Care | Payer: Medicare Other | Source: Ambulatory Visit | Attending: Unknown Physician Specialty | Admitting: Unknown Physician Specialty

## 2015-08-17 ENCOUNTER — Telehealth: Payer: Self-pay | Admitting: Unknown Physician Specialty

## 2015-08-17 DIAGNOSIS — R938 Abnormal findings on diagnostic imaging of other specified body structures: Secondary | ICD-10-CM | POA: Insufficient documentation

## 2015-08-17 DIAGNOSIS — R059 Cough, unspecified: Secondary | ICD-10-CM

## 2015-08-17 DIAGNOSIS — R05 Cough: Secondary | ICD-10-CM

## 2015-08-17 DIAGNOSIS — R5383 Other fatigue: Secondary | ICD-10-CM | POA: Insufficient documentation

## 2015-08-17 DIAGNOSIS — F17200 Nicotine dependence, unspecified, uncomplicated: Secondary | ICD-10-CM

## 2015-08-17 DIAGNOSIS — R5382 Chronic fatigue, unspecified: Secondary | ICD-10-CM

## 2015-08-17 DIAGNOSIS — R9389 Abnormal findings on diagnostic imaging of other specified body structures: Secondary | ICD-10-CM

## 2015-08-17 DIAGNOSIS — R634 Abnormal weight loss: Secondary | ICD-10-CM | POA: Diagnosis not present

## 2015-08-17 MED ORDER — IOPAMIDOL (ISOVUE-300) INJECTION 61%: 75.0000 mL | Freq: Once | INTRAVENOUS | Status: DC | PRN

## 2015-08-17 NOTE — Telephone Encounter (Signed)
Unable to reach pt to discuss CT

## 2015-08-21 ENCOUNTER — Telehealth: Payer: Self-pay | Admitting: Unknown Physician Specialty

## 2015-08-21 NOTE — Telephone Encounter (Signed)
Called pt, no answer, left message on voicemail to call and schedule a follow up appointment. Thanks.

## 2015-08-21 NOTE — Telephone Encounter (Signed)
-----   Message from [name], NP sent at 08/21/2015 11:47 AM EDT ----- Regarding: appt Brandon Hart, I would like to see this pt and see how he is doing.  Can we make an appot for him?  Thanks.  Maybe next week?

## 2015-08-24 NOTE — Telephone Encounter (Signed)
Patient has appointment scheduled 09/01/15.

## 2015-09-01 ENCOUNTER — Ambulatory Visit (INDEPENDENT_AMBULATORY_CARE_PROVIDER_SITE_OTHER): Payer: Medicare Other | Admitting: Unknown Physician Specialty

## 2015-09-01 ENCOUNTER — Encounter: Payer: Self-pay | Admitting: Unknown Physician Specialty

## 2015-09-01 VITALS — BP 121/78 | HR 89 | Temp 97.9°F | Ht 67.7 in | Wt 183.6 lb

## 2015-09-01 DIAGNOSIS — J449 Chronic obstructive pulmonary disease, unspecified: Secondary | ICD-10-CM | POA: Diagnosis not present

## 2015-09-01 DIAGNOSIS — G4700 Insomnia, unspecified: Secondary | ICD-10-CM

## 2015-09-01 DIAGNOSIS — R5383 Other fatigue: Secondary | ICD-10-CM | POA: Diagnosis not present

## 2015-09-01 NOTE — Assessment & Plan Note (Signed)
Pt reports improvement in energy level and fatigue. Follow-up in 6 months.

## 2015-09-01 NOTE — Assessment & Plan Note (Signed)
Pt reported improvement in cough and denies SOB. Discussed CT chest results from prior visitation. Will follow-up in 6 months.

## 2015-09-01 NOTE — Assessment & Plan Note (Signed)
Pt able to sleep through the night. Denies complication in maintaining sleep regimen. Follow-up in 6 months.

## 2015-09-01 NOTE — Progress Notes (Signed)
BP 121/78 mmHg  Pulse 89  Temp(Src) 97.9 F (36.6 C)  Ht 5' 7.7" (1.72 m)  Wt 183 lb 9.6 oz (83.28 kg)  BMI 28.15 kg/m2  SpO2 97%   Subjective:    Patient ID: Brandon Hart, male    DOB: Dec 18, 1945, 70 y.o.   MRN: [id number]  HPI: Brandon Hart is a 70 y.o. male  Chief Complaint  Patient presents with  . other    pt states [name] wanted him to come in so she could just check in with him to see how he is doing   Fatigue: Pt reports he has been doing well. Pt states his fatigue "has been getting better". Pt denies generalized weakness,CP, or SOB.  Pt's appetite has increased.   Insomnia: Pt reports improvement in sleep. Pt states that he has been getting 10-12 hours of sleep per night. Pt reports he goes to bed at 8 pm and sleeps to about 6-7 am. Pt states that he wakes up a few times, but says he is able to fall right back to sleep. Pt reports that he has had improvement with night time coughing. Pt states he used to wake up in the middle of night coughing, but this has resolved.   Cough: Pt reports improvement in daily cough. Denies SOB or coughing with exertion. Denies productive sputum with cough.   Relevant past medical, surgical, family and social history reviewed and updated as indicated. Interim medical history since our last visit reviewed. Allergies and medications reviewed and updated.  Review of Systems  Constitutional: Negative for activity change, appetite change, fatigue and unexpected weight change.  HENT: Negative.   Respiratory: Negative for cough, chest tightness and shortness of breath.   Cardiovascular: Negative.  Negative for chest pain and palpitations.  Gastrointestinal: Negative.   Endocrine: Negative.   Genitourinary: Negative.   Musculoskeletal: Negative.   Skin: Negative.   Neurological: Negative for dizziness, weakness and light-headedness.  Psychiatric/Behavioral: Negative.     Per HPI unless specifically indicated  above     Objective:    BP 121/78 mmHg  Pulse 89  Temp(Src) 97.9 F (36.6 C)  Ht 5' 7.7" (1.72 m)  Wt 183 lb 9.6 oz (83.28 kg)  BMI 28.15 kg/m2  SpO2 97%  Wt Readings from Last 3 Encounters:  09/01/15 183 lb 9.6 oz (83.28 kg)  08/10/15 184 lb 6.4 oz (83.643 kg)  05/27/15 188 lb 12.8 oz (85.639 kg)    Physical Exam  Constitutional: He is oriented to person, place, and time. He appears well-developed and well-nourished. No distress.  HENT:  Head: Normocephalic and atraumatic.  Eyes: Conjunctivae and EOM are normal. Pupils are equal, round, and reactive to light.  Neck: Normal range of motion.  Cardiovascular: Normal rate, regular rhythm, normal heart sounds and intact distal pulses.   Pulmonary/Chest: Effort normal and breath sounds normal. No respiratory distress. He exhibits no tenderness.  Abdominal: Soft. Bowel sounds are normal. There is no tenderness.  Musculoskeletal: Normal range of motion.  Neurological: He is alert and oriented to person, place, and time.  Skin: Skin is warm and dry.  Psychiatric: He has a normal mood and affect. His behavior is normal. Judgment and thought content normal.    Results for orders placed or performed in visit on 08/10/15  CBC with Differential/Platelet  Result Value Ref Range   WBC 9.1 3.4 - 10.8 x10E3/uL   RBC 4.62 4.14 - 5.80 x10E6/uL   Hemoglobin 13.5 12.6 - 17.7 g/dL  Hematocrit 40.6 37.5 - 51.0 %   MCV 88 79 - 97 fL   MCH 29.2 26.6 - 33.0 pg   MCHC 33.3 31.5 - 35.7 g/dL   RDW 13.7 12.3 - 15.4 %   Platelets 259 150 - 379 x10E3/uL   Neutrophils 63 %   Lymphs 26 %   Monocytes 8 %   Eos 2 %   Basos 0 %   Neutrophils Absolute 5.8 1.4 - 7.0 x10E3/uL   Lymphocytes Absolute 2.4 0.7 - 3.1 x10E3/uL   Monocytes Absolute 0.7 0.1 - 0.9 x10E3/uL   EOS (ABSOLUTE) 0.2 0.0 - 0.4 x10E3/uL   Basophils Absolute 0.0 0.0 - 0.2 x10E3/uL   Immature Granulocytes 1 %   Immature Grans (Abs) 0.1 0.0 - 0.1 x10E3/uL  TSH  Result Value Ref Range    TSH 2.330 0.450 - 4.500 uIU/mL  Testosterone  Result Value Ref Range   Testosterone 312 (L) 348 - 1197 ng/dL   Comment, Testosterone Comment   HCV Antibody RFX to Quant PCR  Result Value Ref Range   HCV Ab <0.1 0.0 - 0.9 s/co ratio  Comprehensive metabolic panel  Result Value Ref Range   Glucose 115 (H) 65 - 99 mg/dL   BUN 15 8 - 27 mg/dL   Creatinine, Ser 0.93 0.76 - 1.27 mg/dL   GFR calc non Af Amer 85 >59 mL/min/1.73   GFR calc Af Amer 98 >59 mL/min/1.73   BUN/Creatinine Ratio 16 10 - 22   Sodium 138 134 - 144 mmol/L   Potassium 4.1 3.5 - 5.2 mmol/L   Chloride 96 96 - 106 mmol/L   CO2 24 18 - 29 mmol/L   Calcium 9.3 8.6 - 10.2 mg/dL   Total Protein 6.7 6.0 - 8.5 g/dL   Albumin 4.3 3.6 - 4.8 g/dL   Globulin, Total 2.4 1.5 - 4.5 g/dL   Albumin/Globulin Ratio 1.8 1.2 - 2.2   Bilirubin Total 0.2 0.0 - 1.2 mg/dL   Alkaline Phosphatase 105 39 - 117 IU/L   AST 17 0 - 40 IU/L   ALT 18 0 - 44 IU/L  Specimen status report  Result Value Ref Range   specimen status report Comment       Assessment & Plan:   Problem List Items Addressed This Visit      Unprioritized   COPD, mild (HCC)    Pt reported improvement in cough and denies SOB. Discussed CT chest results from prior visitation. Will follow-up in 6 months.      Insomnia    Pt able to sleep through the night. Denies complication in maintaining sleep regimen. Follow-up in 6 months.      Other fatigue - Primary    Pt reports improvement in energy level and fatigue. Follow-up in 6 months.          Follow up plan: Return in about 6 months (around 03/02/2016) for for cpe .

## 2015-10-20 ENCOUNTER — Other Ambulatory Visit: Payer: Self-pay | Admitting: Unknown Physician Specialty

## 2016-01-22 ENCOUNTER — Other Ambulatory Visit: Payer: Self-pay | Admitting: Unknown Physician Specialty

## 2016-03-23 ENCOUNTER — Telehealth: Payer: Self-pay | Admitting: Unknown Physician Specialty

## 2016-03-23 DIAGNOSIS — Z0000 Encounter for general adult medical examination without abnormal findings: Secondary | ICD-10-CM

## 2016-03-23 NOTE — Telephone Encounter (Signed)
Routing to provider  

## 2016-03-23 NOTE — Telephone Encounter (Signed)
Pt would like to get an order to have a colonoscopy.

## 2016-03-25 ENCOUNTER — Telehealth: Payer: Self-pay

## 2016-03-25 ENCOUNTER — Other Ambulatory Visit: Payer: Self-pay

## 2016-03-25 NOTE — Telephone Encounter (Signed)
Gastroenterology Pre-Procedure Review  Request Date: 04/11/2016 Requesting Physician: Dr. Julian Hy  PATIENT REVIEW QUESTIONS: The patient responded to the following health history questions as indicated:    1. Are you having any GI issues? no 2. Do you have a personal history of Polyps? no 3. Do you have a family history of Colon Cancer or Polyps? yes (father colon cancer) 4. Diabetes Mellitus? no 5. Joint replacements in the past 12 months?no 6. Major health problems in the past 3 months?no 7. Any artificial heart valves, MVP, or defibrillator?no    MEDICATIONS & ALLERGIES:    Patient reports the following regarding taking any anticoagulation/antiplatelet therapy:   Plavix, Coumadin, Eliquis, Xarelto, Lovenox, Pradaxa, Brilinta, or Effient? no Aspirin? no  Patient confirms/reports the following medications:  Current Outpatient Prescriptions  Medication Sig Dispense Refill  . lisinopril-hydrochlorothiazide (PRINZIDE,ZESTORETIC) 20-25 MG tablet Take 1 tablet by mouth daily. DC Lisinopril 90 tablet 0   No current facility-administered medications for this visit.     Patient confirms/reports the following allergies:  No Known Allergies  No orders of the defined types were placed in this encounter.   AUTHORIZATION INFORMATION Primary Insurance: 1D#: Group #:  Secondary Insurance: 1D#: Group #:  SCHEDULE INFORMATION: Date: 04/11/2016 Time: Location: [facility]

## 2016-03-25 NOTE — Telephone Encounter (Signed)
Screening Colonoscopy Z12.11 [facility] 04/11/2016 Dr. Vicente Males Medicare  Pre cert is not required

## 2016-04-11 ENCOUNTER — Ambulatory Visit
Admission: RE | Admit: 2016-04-11 | Discharge: 2016-04-11 | Disposition: A | Discharge status: Home / Self Care | Payer: Medicare Other | Source: Ambulatory Visit | Attending: Gastroenterology | Admitting: Gastroenterology

## 2016-04-11 ENCOUNTER — Encounter
Admission: RE | Disposition: A | Discharge status: Home / Self Care | Payer: Self-pay | Source: Ambulatory Visit | Attending: Gastroenterology

## 2016-04-11 ENCOUNTER — Ambulatory Visit: Payer: Medicare Other | Admitting: Anesthesiology

## 2016-04-11 DIAGNOSIS — I10 Essential (primary) hypertension: Secondary | ICD-10-CM | POA: Insufficient documentation

## 2016-04-11 DIAGNOSIS — Z8371 Family history of colonic polyps: Secondary | ICD-10-CM | POA: Diagnosis not present

## 2016-04-11 DIAGNOSIS — K5730 Diverticulosis of large intestine without perforation or abscess without bleeding: Secondary | ICD-10-CM

## 2016-04-11 DIAGNOSIS — J449 Chronic obstructive pulmonary disease, unspecified: Secondary | ICD-10-CM | POA: Diagnosis not present

## 2016-04-11 DIAGNOSIS — D120 Benign neoplasm of cecum: Secondary | ICD-10-CM | POA: Diagnosis not present

## 2016-04-11 DIAGNOSIS — K635 Polyp of colon: Secondary | ICD-10-CM | POA: Diagnosis not present

## 2016-04-11 DIAGNOSIS — D124 Benign neoplasm of descending colon: Secondary | ICD-10-CM

## 2016-04-11 DIAGNOSIS — D122 Benign neoplasm of ascending colon: Secondary | ICD-10-CM

## 2016-04-11 DIAGNOSIS — F17210 Nicotine dependence, cigarettes, uncomplicated: Secondary | ICD-10-CM | POA: Diagnosis not present

## 2016-04-11 DIAGNOSIS — D123 Benign neoplasm of transverse colon: Secondary | ICD-10-CM

## 2016-04-11 DIAGNOSIS — Z1211 Encounter for screening for malignant neoplasm of colon: Secondary | ICD-10-CM

## 2016-04-11 DIAGNOSIS — K649 Unspecified hemorrhoids: Secondary | ICD-10-CM | POA: Diagnosis not present

## 2016-04-11 DIAGNOSIS — K640 First degree hemorrhoids: Secondary | ICD-10-CM | POA: Diagnosis not present

## 2016-04-11 DIAGNOSIS — K5790 Diverticulosis of intestine, part unspecified, without perforation or abscess without bleeding: Secondary | ICD-10-CM | POA: Diagnosis not present

## 2016-04-11 HISTORY — PX: COLONOSCOPY WITH PROPOFOL: SHX5780

## 2016-04-11 SURGERY — COLONOSCOPY WITH PROPOFOL
Anesthesia: General

## 2016-04-11 MED ORDER — PROPOFOL 10 MG/ML IV BOLUS
INTRAVENOUS | Status: DC | PRN
  Administered 2016-04-11: 60 mg via INTRAVENOUS

## 2016-04-11 MED ORDER — PROPOFOL 500 MG/50ML IV EMUL
INTRAVENOUS | Status: DC | PRN
  Administered 2016-04-11: 150 ug/kg/min via INTRAVENOUS

## 2016-04-11 MED ORDER — SODIUM CHLORIDE 0.9 % IV SOLN
INTRAVENOUS | Status: DC
  Administered 2016-04-11 (×3): via INTRAVENOUS

## 2016-04-11 MED ORDER — GLYCOPYRROLATE 0.2 MG/ML IJ SOLN
INTRAMUSCULAR | Status: DC | PRN
  Administered 2016-04-11: 0.2 mg via INTRAVENOUS

## 2016-04-11 MED ORDER — PHENYLEPHRINE HCL 10 MG/ML IJ SOLN
INTRAMUSCULAR | Status: DC | PRN
  Administered 2016-04-11: 200 ug via INTRAVENOUS
  Administered 2016-04-11 (×4): 100 ug via INTRAVENOUS
  Administered 2016-04-11: 200 ug via INTRAVENOUS

## 2016-04-11 MED ORDER — LIDOCAINE HCL (CARDIAC) 20 MG/ML IV SOLN
INTRAVENOUS | Status: DC | PRN
  Administered 2016-04-11: 60 mg via INTRAVENOUS

## 2016-04-11 NOTE — Anesthesia Postprocedure Evaluation (Signed)
Anesthesia Post Note  Patient: Brandon Hart  Procedure(s) Performed: Procedure(s) (LRB): COLONOSCOPY WITH PROPOFOL (N/A)  Patient location during evaluation: Endoscopy Anesthesia Type: General Level of consciousness: awake and alert Pain management: pain level controlled Vital Signs Assessment: post-procedure vital signs reviewed and stable Respiratory status: spontaneous breathing and respiratory function stable Cardiovascular status: stable Anesthetic complications: no    Last Vitals:  Vitals:   04/11/16 0956  BP: 113/78  Pulse: 96  Resp: 16  Temp: 36.3 C    Last Pain:  Vitals:   04/11/16 0956  TempSrc: Tympanic                 KEPHART,WILLIAM K

## 2016-04-11 NOTE — Op Note (Signed)
[facility] Gastroenterology Patient Name: Brandon Hart Procedure Date: 04/11/2016 10:27 AM MRN: [id number] Account #: [id number] Date of Birth: 11-29-1945 Admit Type: Ambulatory Age: 70 Room: [facility] ENDO ROOM 1 Gender: Male Note Status: Finalized Procedure:            Colonoscopy Indications:          Screening for colon cancer: Family history of colon                        polyps in distant relative(s) Providers:            Jonathon Bellows MD, MD Medicines:            Monitored Anesthesia Care Complications:        No immediate complications. Procedure:            Pre-Anesthesia Assessment:                       - ASA Grade Assessment: II - A patient with mild                        systemic disease.                       After obtaining informed consent, the colonoscope was                        passed under direct vision. Throughout the procedure,                        the patient's blood pressure, pulse, and oxygen                        saturations were monitored continuously. The                        Colonoscope was introduced through the anus and                        advanced to the the cecum, identified by the                        appendiceal orifice, IC valve and transillumination.                        The colonoscopy was technically difficult and complex                        due to multiple diverticula in the colon, poor bowel                        prep, poor endoscopic visualization, significant                        looping and a tortuous colon. Successful completion of                        the procedure was aided by using manual pressure,                        withdrawing and reinserting the scope, withdrawing the  scope and replacing with the pediatric colonoscope,                        straightening and shortening the scope to obtain bowel                        loop reduction and applying abdominal pressure.  The                        patient tolerated the procedure. The quality of the                        bowel preparation was poor. Findings:      The perianal and digital rectal examinations were normal.      Multiple medium-mouthed diverticula were found in the sigmoid colon.       There was no evidence of diverticular bleeding.      Non-bleeding internal hemorrhoids were found during retroflexion. The       hemorrhoids were medium-sized and Grade I (internal hemorrhoids that do       not prolapse).      A 5 mm polyp was found in the cecum. The polyp was sessile. The polyp       was removed with a cold biopsy forceps. Resection and retrieval were       complete.      A 5 mm polyp was found in the ascending colon. The polyp was sessile.       The polyp was removed with a cold biopsy forceps. Resection and       retrieval were complete.      Three sessile, non-bleeding polyps were found in the transverse colon.       The polyps were 10 mm in size. These polyps were removed with a hot       snare. Resection and retrieval were complete.      An 8 mm polyp was found in the descending colon. The polyp was sessile.       The polyp was removed with a hot snare. Resection and retrieval were       complete. Impression:           - Preparation of the colon was poor.                       - Moderate diverticulosis in the sigmoid colon. There                        was no evidence of diverticular bleeding.                       - Non-bleeding internal hemorrhoids.                       - One 5 mm polyp in the cecum, removed with a cold                        biopsy forceps. Resected and retrieved.                       - One 5 mm polyp in the ascending colon, removed with a  cold biopsy forceps. Resected and retrieved.                       - Three 10 mm, non-bleeding polyps in the transverse                        colon, removed with a hot snare. Resected and retrieved.                        - One 8 mm polyp in the descending colon, removed with                        a hot snare. Resected and retrieved. Recommendation:       - Patient has a contact number available for                        emergencies. The signs and symptoms of potential                        delayed complications were discussed with the patient.                        Return to normal activities tomorrow. Written discharge                        instructions were provided to the patient.                       - Resume previous diet.                       - Continue present medications.                       - Await pathology results.                       - Repeat colonoscopy in 6 months for surveillance.                       - For future colonoscopy the patient will require an                        extended preparation. If there are any questions,                        please contact the gastroenterologist. Procedure Code(s):    --- Professional ---                       [phone], Colonoscopy, flexible; with removal of tumor(s),                        polyp(s), or other lesion(s) by snare technique                       [id number], 59, Colonoscopy, flexible; with biopsy, single                        or multiple Diagnosis Code(s):    --- Professional ---  Z12.11, Encounter for screening for malignant neoplasm                        of colon                       Z83.71, Family history of colonic polyps                       K64.0, First degree hemorrhoids                       D12.0, Benign neoplasm of cecum                       D12.2, Benign neoplasm of ascending colon                       D12.4, Benign neoplasm of descending colon                       D12.3, Benign neoplasm of transverse colon (hepatic                        flexure or splenic flexure)                       K57.30, Diverticulosis of large intestine without                        perforation or  abscess without bleeding CPT copyright 2016 American Medical Association. All rights reserved. The codes documented in this report are preliminary and upon coder review may  be revised to meet current compliance requirements. Jonathon Bellows, MD Jonathon Bellows MD, MD 04/11/2016 11:36:13 AM This report has been signed electronically. Number of Addenda: 0 Note Initiated On: 04/11/2016 10:27 AM Scope Withdrawal Time: 0 hours 19 minutes 0 seconds  Total Procedure Duration: 0 hours 54 minutes 34 seconds       [facility]

## 2016-04-11 NOTE — Transfer of Care (Signed)
Immediate Anesthesia Transfer of Care Note  Patient: Garrik Franzese  Procedure(s) Performed: Procedure(s): COLONOSCOPY WITH PROPOFOL (N/A)  Patient Location: Endoscopy Unit  Anesthesia Type:General  Level of Consciousness: awake, alert , oriented and patient cooperative  Airway & Oxygen Therapy: Patient Spontanous Breathing and Patient connected to nasal cannula oxygen  Post-op Assessment: Report given to RN, Post -op Vital signs reviewed and stable and Patient moving all extremities X 4  Post vital signs: Reviewed and stable  Last Vitals:  Vitals:   04/11/16 0956  BP: 113/78  Pulse: 96  Resp: 16  Temp: 36.3 C    Last Pain:  Vitals:   04/11/16 0956  TempSrc: Tympanic         Complications: No apparent anesthesia complications

## 2016-04-11 NOTE — Anesthesia Preprocedure Evaluation (Signed)
Anesthesia Evaluation  Patient identified by MRN, date of birth, ID band Patient awake    Reviewed: Allergy & Precautions, NPO status , Patient's Chart, lab work & pertinent test results  Airway Mallampati: II       Dental   Pulmonary COPD, Current Smoker,           Cardiovascular hypertension, Pt. on medications      Neuro/Psych    GI/Hepatic negative GI ROS, Neg liver ROS,   Endo/Other  negative endocrine ROS  Renal/GU negative Renal ROS     Musculoskeletal   Abdominal   Peds  Hematology negative hematology ROS (+)   Anesthesia Other Findings   Reproductive/Obstetrics                            Anesthesia Physical Anesthesia Plan  ASA: II  Anesthesia Plan: General   Post-op Pain Management:    Induction: Intravenous  Airway Management Planned: Nasal Cannula  Additional Equipment:   Intra-op Plan:   Post-operative Plan:   Informed Consent: I have reviewed the patients History and Physical, chart, labs and discussed the procedure including the risks, benefits and alternatives for the proposed anesthesia with the patient or authorized representative who has indicated his/her understanding and acceptance.     Plan Discussed with:   Anesthesia Plan Comments:         Anesthesia Quick Evaluation

## 2016-04-11 NOTE — H&P (Signed)
  Jonathon Bellows MD [address], [place], [place] [address] Phone: [phone] Fax : [phone]  Primary Care Physician:  [name], NP Primary Gastroenterologist:  Dr. Jonathon Bellows   Pre-Procedure History & Physical: HPI:  Brandon Hart is a 70 y.o. male is here for an colonoscopy.   Past Medical History:  Diagnosis Date  . Hypertension     Past Surgical History:  Procedure Laterality Date  . HERNIA REPAIR    . VASECTOMY      Prior to Admission medications   Medication Sig Start Date End Date Taking? Authorizing Provider  lisinopril-hydrochlorothiazide (PRINZIDE,ZESTORETIC) 20-25 MG tablet Take 1 tablet by mouth daily. DC Lisinopril 01/22/16  Yes Megan P Johnson, DO    Allergies as of 03/25/2016  . (No Known Allergies)    Family History  Problem Relation Age of Onset  . Cancer Mother   . Hypertension Mother   . Cancer Father   . Hypertension Father   . Hypertension Brother   . Hypertension Brother     Social History   Social History  . Marital status: Single    Spouse name: N/A  . Number of children: N/A  . Years of education: N/A   Occupational History  . Not on file.   Social History Main Topics  . Smoking status: Current Every Day Smoker    Packs/day: 1.00    Types: Cigarettes  . Smokeless tobacco: Never Used  . Alcohol use 3.0 - 3.6 oz/week    5 - 6 Cans of beer per week     Comment:  8 months  . Drug use: No  . Sexual activity: Yes   Other Topics Concern  . Not on file   Social History Narrative  . No narrative on file    Review of Systems: See HPI, otherwise negative ROS  Physical Exam: BP 113/78   Pulse 96   Temp 97.4 F (36.3 C) (Tympanic)   Resp 16   Ht 5\' 10"  (1.778 m)   Wt 180 lb (81.6 kg)   SpO2 98%   BMI 25.83 kg/m  General:   Alert,  pleasant and cooperative in NAD Head:  Normocephalic and atraumatic. Neck:  Supple; no masses or thyromegaly. Lungs:  Clear throughout to auscultation.    Heart:   Regular rate and rhythm. Abdomen:  Soft, nontender and nondistended. Normal bowel sounds, without guarding, and without rebound.   Neurologic:  Alert and  oriented x4;  grossly normal neurologically.  Impression/Plan: Brandon Hart is here for an colonoscopy to be performed for high risk colorectal cancer screening   Risks, benefits, limitations, and alternatives regarding  colonoscopy have been reviewed with the patient.  Questions have been answered.  All parties agreeable.   Jonathon Bellows, MD  04/11/2016, 10:19 AM

## 2016-04-12 ENCOUNTER — Encounter: Payer: Self-pay | Admitting: Gastroenterology

## 2016-04-12 LAB — SURGICAL PATHOLOGY

## 2016-04-20 ENCOUNTER — Telehealth: Payer: Self-pay

## 2016-04-20 NOTE — Telephone Encounter (Signed)
-----   Message from Jonathon Bellows, MD sent at 04/13/2016 10:06 AM EST ----- Brandon Hart,   Multiple adenomas, lets repeat colonoscopy in 6 months since his prep was inadequate . He will need a 2 day prep   FYI [name]  Dr Jonathon Bellows  Gastroenterology/Hepatology Pager: [phone]

## 2016-04-20 NOTE — Telephone Encounter (Signed)
-----   Message from Jonathon Bellows, MD sent at 04/13/2016 10:06 AM EST ----- Leelah Hanna,   Multiple adenomas, lets repeat colonoscopy in 6 months since his prep was inadequate . He will need a 2 day prep   FYI [name]  Dr Jonathon Bellows  Gastroenterology/Hepatology Pager: [phone]

## 2016-04-20 NOTE — Telephone Encounter (Signed)
Pt notified of colonoscopy results. Postdated message has been done to contact pt in June to schedule repeat colonoscopy.

## 2016-04-22 ENCOUNTER — Other Ambulatory Visit: Payer: Self-pay

## 2016-04-22 MED ORDER — LISINOPRIL-HYDROCHLOROTHIAZIDE 20-25 MG PO TABS: 1.0000 | ORAL_TABLET | Freq: Every day | ORAL | 0 refills | Status: DC

## 2016-04-25 ENCOUNTER — Encounter: Payer: Self-pay | Admitting: Family Medicine

## 2016-04-25 ENCOUNTER — Ambulatory Visit (INDEPENDENT_AMBULATORY_CARE_PROVIDER_SITE_OTHER): Payer: Medicare Other | Admitting: Family Medicine

## 2016-04-25 VITALS — BP 150/92 | HR 67 | Temp 97.4°F | Ht 69.5 in | Wt 192.6 lb

## 2016-04-25 DIAGNOSIS — I10 Essential (primary) hypertension: Secondary | ICD-10-CM | POA: Diagnosis not present

## 2016-04-25 DIAGNOSIS — B356 Tinea cruris: Secondary | ICD-10-CM

## 2016-04-25 MED ORDER — AMLODIPINE BESYLATE 5 MG PO TABS: 5.0000 mg | ORAL_TABLET | Freq: Every day | ORAL | 3 refills | Status: DC

## 2016-04-25 NOTE — Progress Notes (Signed)
   BP (!) 150/92 (BP Location: Left Arm, Patient Position: Sitting, Cuff Size: Normal)   Pulse 67   Temp 97.4 F (36.3 C)   Ht 5' 9.5" (1.765 m)   Wt 192 lb 9.6 oz (87.4 kg)   SpO2 99%   BMI 28.03 kg/m    Subjective:    Patient ID: Brandon Hart, male    DOB: 1946-02-05, 70 y.o.   MRN: [id number]  HPI: Brandon Hart is a 70 y.o. male  Chief Complaint  Patient presents with  . Rash    On penis. Patient states it itches.   Patient with [place] slight rash and foreskin area no ulcerations or unusual exposures. Has tried some over-the-counter medication but without success. Patient's been taking his blood pressure medicines faithfully one a day without problems. On review last glucose slightly elevated now patient with fungal infection history.  Relevant past medical, surgical, family and social history reviewed and updated as indicated. Interim medical history since our last visit reviewed. Allergies and medications reviewed and updated.  Review of Systems  Constitutional: Negative.   Respiratory: Negative.   Cardiovascular: Negative.     Per HPI unless specifically indicated above     Objective:    BP (!) 150/92 (BP Location: Left Arm, Patient Position: Sitting, Cuff Size: Normal)   Pulse 67   Temp 97.4 F (36.3 C)   Ht 5' 9.5" (1.765 m)   Wt 192 lb 9.6 oz (87.4 kg)   SpO2 99%   BMI 28.03 kg/m   Wt Readings from Last 3 Encounters:  04/25/16 192 lb 9.6 oz (87.4 kg)  04/11/16 180 lb (81.6 kg)  09/01/15 183 lb 9.6 oz (83.3 kg)    Physical Exam  Constitutional: He is oriented to person, place, and time. He appears well-developed and well-nourished. No distress.  HENT:  Head: Normocephalic and atraumatic.  Right Ear: Hearing normal.  Left Ear: Hearing normal.  Nose: Nose normal.  Eyes: Conjunctivae and lids are normal. Right eye exhibits no discharge. Left eye exhibits no discharge. No scleral icterus.  Cardiovascular: Normal rate, regular rhythm  and normal heart sounds.   Pulmonary/Chest: Effort normal. No respiratory distress.  Musculoskeletal: Normal range of motion.  Neurological: He is alert and oriented to person, place, and time.  Skin: Skin is intact. No rash noted.  Changes of yeast infection and foreskin area  Psychiatric: He has a normal mood and affect. His speech is normal and behavior is normal. Judgment and thought content normal. Cognition and memory are normal.        Assessment & Plan:   Problem List Items Addressed This Visit      Cardiovascular and Mediastinum   Hypertension    With poor control of hypertension we'll add amlodipine 5 mg to patient's current medications will recheck in a month or so for physical exam and blood pressure recheck.      Relevant Medications   amLODipine (NORVASC) 5 MG tablet   Other Relevant Orders   Basic metabolic panel    Other Visit Diagnoses    Tinea cruris    -  Primary   Because of slight elevation of nonfasting glucose Will check BMP again with attention to glucose.       Follow up plan: Return in about 4 weeks (around 05/23/2016) for Physical Exam.

## 2016-04-25 NOTE — Assessment & Plan Note (Signed)
With poor control of hypertension we'll add amlodipine 5 mg to patient's current medications will recheck in a month or so for physical exam and blood pressure recheck.

## 2016-04-26 ENCOUNTER — Encounter: Payer: Self-pay | Admitting: Family Medicine

## 2016-04-26 LAB — BASIC METABOLIC PANEL
BUN / CREAT RATIO: 15 (ref 10–24)
BUN: 14 mg/dL (ref 8–27)
CO2: 25 mmol/L (ref 18–29)
CREATININE: 0.93 mg/dL (ref 0.76–1.27)
Calcium: 9.3 mg/dL (ref 8.6–10.2)
Chloride: 99 mmol/L (ref 96–106)
GFR calc Af Amer: 97 mL/min/{1.73_m2} (ref >59)
GFR calc non Af Amer: 84 mL/min/{1.73_m2} (ref >59)
GLUCOSE: 81 mg/dL (ref 65–99)
Potassium: 4.2 mmol/L (ref 3.5–5.2)
SODIUM: 140 mmol/L (ref 134–144)

## 2016-05-24 ENCOUNTER — Ambulatory Visit: Payer: Medicare Other | Admitting: Family Medicine

## 2016-06-28 ENCOUNTER — Encounter: Payer: Medicare Other | Admitting: Unknown Physician Specialty

## 2016-07-13 ENCOUNTER — Encounter: Payer: Self-pay | Admitting: Unknown Physician Specialty

## 2016-07-13 ENCOUNTER — Ambulatory Visit (INDEPENDENT_AMBULATORY_CARE_PROVIDER_SITE_OTHER): Payer: Medicare Other | Admitting: Unknown Physician Specialty

## 2016-07-13 VITALS — BP 108/71 | HR 90 | Temp 97.9°F | Ht 67.6 in | Wt 189.6 lb

## 2016-07-13 DIAGNOSIS — Z7189 Other specified counseling: Secondary | ICD-10-CM | POA: Diagnosis not present

## 2016-07-13 DIAGNOSIS — I10 Essential (primary) hypertension: Secondary | ICD-10-CM

## 2016-07-13 DIAGNOSIS — F17200 Nicotine dependence, unspecified, uncomplicated: Secondary | ICD-10-CM

## 2016-07-13 DIAGNOSIS — Z0000 Encounter for general adult medical examination without abnormal findings: Secondary | ICD-10-CM

## 2016-07-13 NOTE — Assessment & Plan Note (Signed)
Stable, continue present medications.   

## 2016-07-13 NOTE — Assessment & Plan Note (Addendum)
Discussed quitting.  Refusing low dose CT

## 2016-07-13 NOTE — Progress Notes (Signed)
BP 108/71 (BP Location: Left Arm, Patient Position: Sitting, Cuff Size: Large)   Pulse 90   Temp 97.9 F (36.6 C)   Ht 5' 7.6" (1.717 m)   Wt 189 lb 9.6 oz (86 kg)   SpO2 97%   BMI 29.17 kg/m    Subjective:    Patient ID: Brandon Hart, male    DOB: [birth year], 71 y.o.   MRN: [id number]  HPI: Brandon Hart is a 71 y.o. male  Chief Complaint  Patient presents with  . Medicare Wellness   Hypertension Using medications without difficulty Average home BPs Not checking  No problems or lightheadedness No chest pain with exertion or shortness of breath No Edema  Functional Status Survey: Is the patient deaf or have difficulty hearing?: No Does the patient have difficulty seeing, even when wearing glasses/contacts?: No Does the patient have difficulty concentrating, remembering, or making decisions?: No Does the patient have difficulty walking or climbing stairs?: No Does the patient have difficulty dressing or bathing?: No Does the patient have difficulty doing errands alone such as visiting a doctor's office or shopping?: No Fall Risk  07/13/2016 05/08/2015 04/28/2015  Falls in the past year? No No No   Depression screen [facility] 2/9 07/13/2016 04/28/2015  Decreased Interest 0 0  Down, Depressed, Hopeless 0 0  PHQ - 2 Score 0 0  Altered sleeping 0 -  Tired, decreased energy 1 -  Change in appetite 0 -  Feeling bad or failure about yourself  0 -  Trouble concentrating 0 -  Moving slowly or fidgety/restless 0 -  Suicidal thoughts 0 -  PHQ-9 Score 1 -   Social History   Social History  . Marital status: Single    Spouse name: N/A  . Number of children: N/A  . Years of education: N/A   Occupational History  . Not on file.   Social History Main Topics  . Smoking status: Current Every Day Smoker    Packs/day: 1.00    Types: Cigarettes  . Smokeless tobacco: Never Used  . Alcohol use No     Comment: pt states he has not drink anything in about a  year  . Drug use: No  . Sexual activity: Yes   Other Topics Concern  . Not on file   Social History Narrative  . No narrative on file   Past Medical History:  Diagnosis Date  . Hypertension    Past Surgical History:  Procedure Laterality Date  . COLONOSCOPY WITH PROPOFOL N/A 04/11/2016   Procedure: COLONOSCOPY WITH PROPOFOL;  Surgeon: Jonathon Bellows, MD;  Location: [facility] ENDOSCOPY;  Service: Endoscopy;  Laterality: N/A;  . HERNIA REPAIR    . VASECTOMY      Relevant past medical, surgical, family and social history reviewed and updated as indicated. Interim medical history since our last visit reviewed. Allergies and medications reviewed and updated.  Review of Systems  Per HPI unless specifically indicated above     Objective:    BP 108/71 (BP Location: Left Arm, Patient Position: Sitting, Cuff Size: Large)   Pulse 90   Temp 97.9 F (36.6 C)   Ht 5' 7.6" (1.717 m)   Wt 189 lb 9.6 oz (86 kg)   SpO2 97%   BMI 29.17 kg/m   Wt Readings from Last 3 Encounters:  07/13/16 189 lb 9.6 oz (86 kg)  04/25/16 192 lb 9.6 oz (87.4 kg)  04/11/16 180 lb (81.6 kg)    Physical Exam  Constitutional:  He is oriented to person, place, and time. He appears well-developed and well-nourished.  HENT:  Head: Normocephalic.  Right Ear: Tympanic membrane, external ear and ear canal normal.  Left Ear: Tympanic membrane, external ear and ear canal normal.  Mouth/Throat: Uvula is midline, oropharynx is clear and moist and mucous membranes are normal.  Eyes: Pupils are equal, round, and reactive to light.  Cardiovascular: Normal rate, regular rhythm and normal heart sounds.  Exam reveals no gallop and no friction rub.   No murmur heard. Pulmonary/Chest: Effort normal and breath sounds normal. No respiratory distress.  Abdominal: Soft. Bowel sounds are normal. He exhibits no distension. There is no tenderness.  Musculoskeletal: Normal range of motion.  Neurological: He is alert and oriented to  person, place, and time. He has normal reflexes.  Skin: Skin is warm and dry.  Psychiatric: He has a normal mood and affect. His behavior is normal. Judgment and thought content normal.       Assessment & Plan:   Problem List Items Addressed This Visit      Unprioritized   Advanced care planning/counseling discussion    A voluntary discussion about advance care planning including the explanation and discussion of advance directives was extensively discussed  with the patient.  Explanation about the health care proxy and Living will was reviewed and packet with forms with explanation of how to fill them out was given.  During this discussion, the patient was able to identify a health care proxy as his daughters and plans to fill out the paperwork required.  Patient was offered a separate [place] visit for further assistance with forms.         Hypertension    Stable, continue present medications.        Relevant Orders   Comprehensive metabolic panel   Lipid Panel w/o Chol/HDL Ratio   Tobacco dependence    Discussed quitting.  Refusing low dose CT       Other Visit Diagnoses    Annual physical exam    -  Primary       Follow up plan: Return in about 6 months (around 01/10/2017).

## 2016-07-13 NOTE — Assessment & Plan Note (Signed)
A voluntary discussion about advance care planning including the explanation and discussion of advance directives was extensively discussed  with the patient.  Explanation about the health care proxy and Living will was reviewed and packet with forms with explanation of how to fill them out was given.  During this discussion, the patient was able to identify a health care proxy as his daughters and plans to fill out the paperwork required.  Patient was offered a separate [place] visit for further assistance with forms.

## 2016-07-14 ENCOUNTER — Other Ambulatory Visit: Payer: Self-pay | Admitting: Unknown Physician Specialty

## 2016-07-14 DIAGNOSIS — E1169 Type 2 diabetes mellitus with other specified complication: Secondary | ICD-10-CM | POA: Insufficient documentation

## 2016-07-14 DIAGNOSIS — E785 Hyperlipidemia, unspecified: Secondary | ICD-10-CM | POA: Insufficient documentation

## 2016-07-14 DIAGNOSIS — E781 Pure hyperglyceridemia: Secondary | ICD-10-CM

## 2016-07-14 LAB — COMPREHENSIVE METABOLIC PANEL
A/G RATIO: 1.5 (ref 1.2–2.2)
ALBUMIN: 4.1 g/dL (ref 3.6–4.8)
ALK PHOS: 100 IU/L (ref 39–117)
ALT: 14 IU/L (ref 0–44)
AST: 15 IU/L (ref 0–40)
BUN / CREAT RATIO: 23 (ref 10–24)
BUN: 18 mg/dL (ref 8–27)
CHLORIDE: 95 mmol/L — AB (ref 96–106)
CO2: 26 mmol/L (ref 18–29)
Calcium: 9.3 mg/dL (ref 8.6–10.2)
Creatinine, Ser: 0.79 mg/dL (ref 0.76–1.27)
GFR calc Af Amer: 107 mL/min/{1.73_m2} (ref >59)
GFR calc non Af Amer: 92 mL/min/{1.73_m2} (ref >59)
GLUCOSE: 92 mg/dL (ref 65–99)
Globulin, Total: 2.8 g/dL (ref 1.5–4.5)
POTASSIUM: 4.3 mmol/L (ref 3.5–5.2)
Sodium: 140 mmol/L (ref 134–144)
Total Protein: 6.9 g/dL (ref 6.0–8.5)

## 2016-07-14 LAB — LIPID PANEL W/O CHOL/HDL RATIO
CHOLESTEROL TOTAL: 201 mg/dL — AB (ref 100–199)
HDL: 28 mg/dL — ABNORMAL LOW (ref >39)
Triglycerides: 638 mg/dL (ref 0–149)

## 2016-07-18 ENCOUNTER — Other Ambulatory Visit: Payer: Medicare Other

## 2016-07-18 DIAGNOSIS — E781 Pure hyperglyceridemia: Secondary | ICD-10-CM

## 2016-07-19 ENCOUNTER — Encounter: Payer: Self-pay | Admitting: Unknown Physician Specialty

## 2016-07-19 LAB — LIPID PANEL W/O CHOL/HDL RATIO
Cholesterol, Total: 214 mg/dL — ABNORMAL HIGH (ref 100–199)
HDL: 33 mg/dL — AB (ref >39)
LDL Calculated: 128 mg/dL — ABNORMAL HIGH (ref 0–99)
TRIGLYCERIDES: 264 mg/dL — AB (ref 0–149)
VLDL CHOLESTEROL CAL: 53 mg/dL — AB (ref 5–40)

## 2016-07-20 ENCOUNTER — Other Ambulatory Visit: Payer: Self-pay | Admitting: Unknown Physician Specialty

## 2016-11-02 ENCOUNTER — Other Ambulatory Visit: Payer: Self-pay | Admitting: Family Medicine

## 2016-11-02 NOTE — Telephone Encounter (Signed)
Last OV: 07/13/16 Next OV: None of file.   BMP Latest Ref Rng & Units 07/13/2016 04/25/2016 08/10/2015  Glucose 65 - 99 mg/dL 92 81 115(H)  BUN 8 - 27 mg/dL 18 14 15   Creatinine 0.76 - 1.27 mg/dL 0.79 0.93 0.93  BUN/Creat Ratio 10 - 24 23 15 16   Sodium 134 - 144 mmol/L 140 140 138  Potassium 3.5 - 5.2 mmol/L 4.3 4.2 4.1  Chloride 96 - 106 mmol/L 95(L) 99 96  CO2 18 - 29 mmol/L 26 25 24   Calcium 8.6 - 10.2 mg/dL 9.3 9.3 9.3

## 2016-12-05 ENCOUNTER — Other Ambulatory Visit: Payer: Self-pay

## 2016-12-05 MED ORDER — AMLODIPINE BESYLATE 5 MG PO TABS: 5.0000 mg | ORAL_TABLET | Freq: Every day | ORAL | 1 refills | Status: DC

## 2016-12-05 NOTE — Telephone Encounter (Signed)
Patient last seen 07/13/16, 6 month f/up requested, nothing scheduled yet. Pharmacy is [facility].

## 2017-01-20 ENCOUNTER — Other Ambulatory Visit: Payer: Self-pay | Admitting: Unknown Physician Specialty

## 2017-04-03 ENCOUNTER — Other Ambulatory Visit: Payer: Self-pay | Admitting: Unknown Physician Specialty

## 2017-04-21 ENCOUNTER — Other Ambulatory Visit: Payer: Self-pay | Admitting: Unknown Physician Specialty

## 2017-04-21 NOTE — Telephone Encounter (Signed)
[place] sent refill request on patients lisinopril hctz 20-25 mg 90 tab  thanks

## 2017-07-05 ENCOUNTER — Other Ambulatory Visit: Payer: Self-pay

## 2017-07-05 MED ORDER — AMLODIPINE BESYLATE 5 MG PO TABS: 5.0000 mg | ORAL_TABLET | Freq: Every day | ORAL | 0 refills | Status: DC

## 2017-07-05 NOTE — Telephone Encounter (Signed)
Patient last seen 07/13/16 and has appt with [facility] 07/14/17.

## 2017-07-14 ENCOUNTER — Ambulatory Visit (INDEPENDENT_AMBULATORY_CARE_PROVIDER_SITE_OTHER): Payer: Medicare Other

## 2017-07-14 VITALS — BP 131/80 | HR 92 | Temp 98.5°F | Resp 17 | Ht 68.0 in | Wt 192.5 lb

## 2017-07-14 DIAGNOSIS — Z0000 Encounter for general adult medical examination without abnormal findings: Secondary | ICD-10-CM

## 2017-07-14 NOTE — Progress Notes (Signed)
Subjective:   Brandon Hart is a 72 y.o. male who presents for Medicare Annual/Subsequent preventive examination.  Review of Systems:   Cardiac Risk Factors include: smoking/ tobacco exposure;hypertension;advanced age (>60men, >5 women);male gender     Objective:    Vitals: BP 131/80 (BP Location: Left Arm)   Pulse 92   Temp 98.5 F (36.9 C) (Temporal)   Resp 17   Ht 5\' 8"  (1.727 m)   Wt 192 lb 8 oz (87.3 kg)   BMI 29.27 kg/m   Body mass index is 29.27 kg/m.  Advanced Directives 07/14/2017 07/13/2016 04/11/2016 04/28/2015  Does Patient Have a Medical Advance Directive? No No No No  Would patient like information on creating a medical advance directive? No - Patient declined - No - Patient declined -    Tobacco Social History   Tobacco Use  Smoking Status Current Every Day Smoker  . Packs/day: 1.00  . Types: Cigarettes  Smokeless Tobacco Never Used     Ready to quit: Yes Counseling given: Yes   Clinical Intake:  Pre-visit preparation completed: Yes  Pain : No/denies pain     Nutritional Risks: None Diabetes: No  How often do you need to have someone help you when you read instructions, pamphlets, or other written materials from your doctor or pharmacy?: 1 - Never What is the last grade level you completed in school?: 10th grade, GED  Interpreter Needed?: No  Information entered by :: Tiffany Hill,LPN   Past Medical History:  Diagnosis Date  . Hypertension    Past Surgical History:  Procedure Laterality Date  . COLONOSCOPY WITH PROPOFOL N/A 04/11/2016   Procedure: COLONOSCOPY WITH PROPOFOL;  Surgeon: Jonathon Bellows, MD;  Location: [facility] ENDOSCOPY;  Service: Endoscopy;  Laterality: N/A;  . HERNIA REPAIR    . VASECTOMY     Family History  Problem Relation Age of Onset  . Hypertension Mother   . Breast cancer Mother   . Hypertension Father   . Colon cancer Father   . Hypertension Brother   . Hypertension Brother    Social History    Socioeconomic History  . Marital status: Single    Spouse name: None  . Number of children: None  . Years of education: None  . Highest education level: None  Social Needs  . Financial resource strain: Not hard at all  . Food insecurity - worry: Never true  . Food insecurity - inability: Never true  . Transportation needs - medical: No  . Transportation needs - non-medical: No  Occupational History  . None  Tobacco Use  . Smoking status: Current Every Day Smoker    Packs/day: 1.00    Types: Cigarettes  . Smokeless tobacco: Never Used  Substance and Sexual Activity  . Alcohol use: No    Comment: pt states he has not drink anything in about a year  . Drug use: No  . Sexual activity: Yes  Other Topics Concern  . None  Social History Narrative  . None    Outpatient Encounter Medications as of 07/14/2017  Medication Sig  . amLODipine (NORVASC) 5 MG tablet Take 1 tablet (5 mg total) by mouth daily.  Mar[facility] Kitchen lisinopril-hydrochlorothiazide (PRINZIDE,ZESTORETIC) 20-25 MG tablet Take 1 tablet by mouth daily.   No facility-administered encounter medications on file as of 07/14/2017.     Activities of Daily Living In your present state of health, do you have any difficulty performing the following activities: 07/14/2017  Hearing? N  Vision? N  Difficulty concentrating or making decisions? N  Walking or climbing stairs? N  Dressing or bathing? N  Doing errands, shopping? N  Preparing Food and eating ? N  Using the Toilet? N  In the past six months, have you accidently leaked urine? N  Do you have problems with loss of bowel control? N  Managing your Medications? N  Managing your Finances? N  Housekeeping or managing your Housekeeping? N  Some recent data might be hidden    Patient Care Team: [name], NP as PCP - General (Nurse Practitioner)   Assessment:   This is a routine wellness examination for [name].  Exercise Activities and Dietary recommendations Current  Exercise Habits: The patient does not participate in regular exercise at present, Exercise limited by: None identified  Goals    . Quit Smoking     Smoking cessation discussed       Fall Risk Fall Risk  07/14/2017 07/13/2016 05/08/2015 04/28/2015  Falls in the past year? No No No No   Is the patient's home free of loose throw rugs in walkways, pet beds, electrical cords, etc?   no      Grab bars in the bathroom? no      Handrails on the stairs?   yes      Adequate lighting?   yes  Timed Get Up and Go Performed: Completed in 8 seconds with no use of assistive devices, steady gait. No intervention needed at this time.   Depression Screen PHQ 2/9 Scores 07/14/2017 07/13/2016 04/28/2015  PHQ - 2 Score 2 0 0  PHQ- 9 Score 4 1 -    Cognitive Function     6CIT Screen 07/14/2017  What Year? 0 points  What month? 0 points  What time? 0 points  Count back from 20 0 points  Months in reverse 0 points  Repeat phrase 2 points  Total Score 2     There is no immunization history on file for this patient.  Qualifies for Shingles Vaccine? Yes, discussed shingrix vaccine    Screening Tests Health Maintenance  Topic Date Due  . INFLUENZA VACCINE  03/16/2018 (Originally 12/14/2016)  . TETANUS/TDAP  07/15/2018 (Originally 01/03/1967)  . PNA vac Low Risk Adult (1 of 2 - PCV13) 07/15/2018 (Originally 01/02/2013)  . COLONOSCOPY  04/11/2026  . Hepatitis C Screening  Completed   Declined al vaccines today.   Cancer Screenings: Lung: Low Dose CT Chest recommended if Age 3-80 years, 30 pack-year currently smoking OR have quit w/in 15years. Patient does qualify.- declined Colorectal: completed 04/11/2016  Additional Screenings: Hepatitis B/HIV/Syphillis: not indicated  Hepatitis C Screening: completed 08/10/2015    Plan:    I have personally reviewed and addressed the Medicare Annual Wellness questionnaire and have noted the following in the patient's chart:  A. Medical and social  history B. Use of alcohol, tobacco or illicit drugs  C. Current medications and supplements D. Functional ability and status E.  Nutritional status F.  Physical activity G. Advance directives H. List of other physicians I.  Hospitalizations, surgeries, and ER visits in previous 12 months J.  [place] such as hearing and vision if needed, cognitive and depression L. Referrals and appointments   In addition, I have reviewed and discussed with patient certain preventive protocols, quality metrics, and best practice recommendations. A written personalized care plan for preventive services as well as general preventive health recommendations were provided to patient.   Signed,  Tyler Aas, LPN Nurse Health Advisor  Nurse Notes: none

## 2017-07-14 NOTE — Patient Instructions (Addendum)
Brandon Hart , Thank you for taking time to come for your Medicare Wellness Visit. I appreciate your ongoing commitment to your health goals. Please review the following plan we discussed and let me know if I can assist you in the future.   Screening recommendations/referrals: Colonoscopy: completed 04/11/2016 Recommended yearly ophthalmology/optometry visit for glaucoma screening and checkup Recommended yearly dental visit for hygiene and checkup  Vaccinations: Influenza vaccine: due now- declined Pneumococcal vaccine: due now- declined  Tdap vaccine: due now, check with your insurance company for coverage  Shingles vaccine: due now, check with your insurance company for coverage     Advanced directives: Advance directive discussed with you today. I have provided a copy for you to complete at home and have notarized. Once this is complete please bring a copy in to our office so we can scan it into your chart.  Conditions/risks identified: If you wish to quit smoking, help is available. For free tobacco cessation program offerings call the [facility] at [phone] or Live Well Line at (207) [phone]. You may also visit [email or web address] or email [email or web address] for more information on other programs.   Discussed lung cancer screening - you declined at this time. Please let us know if you change your mind.   Next appointment: Follow up in one year for your annual wellness exam.   Preventive Care 65 Years and Older, Male Preventive care refers to lifestyle choices and visits with your health care provider that can promote health and wellness. What does preventive care include?  A yearly physical exam. This is also called an annual well check.  Dental exams once or twice a year.  Routine eye exams. Ask your health care provider how often you should have your eyes checked.  Personal lifestyle choices, including:  Daily care of your teeth and  gums.  Regular physical activity.  Eating a healthy diet.  Avoiding tobacco and drug use.  Limiting alcohol use.  Practicing safe sex.  Taking low doses of aspirin every day.  Taking vitamin and mineral supplements as recommended by your health care provider. What happens during an annual well check? The services and screenings done by your health care provider during your annual well check will depend on your age, overall health, lifestyle risk factors, and family history of disease. Counseling  Your health care provider may ask you questions about your:  Alcohol use.  Tobacco use.  Drug use.  Emotional well-being.  Home and relationship well-being.  Sexual activity.  Eating habits.  History of falls.  Memory and ability to understand (cognition).  Work and work Statistician. Screening  You may have the following tests or measurements:  Height, weight, and BMI.  Blood pressure.  Lipid and cholesterol levels. These may be checked every 5 years, or more frequently if you are over 70 years old.  Skin check.  Lung cancer screening. You may have this screening every year starting at age 70 if you have a 30-pack-year history of smoking and currently smoke or have quit within the past 15 years.  Fecal occult blood test (FOBT) of the stool. You may have this test every year starting at age 70.  Flexible sigmoidoscopy or colonoscopy. You may have a sigmoidoscopy every 5 years or a colonoscopy every 10 years starting at age 70.  Prostate cancer screening. Recommendations will vary depending on your family history and other risks.  Hepatitis C blood test.  Hepatitis B blood test.  Sexually transmitted disease (STD) testing.  Diabetes screening. This is done by checking your blood sugar (glucose) after you have not eaten for a while (fasting). You may have this done every 1-3 years.  Abdominal aortic aneurysm (AAA) screening. You may need this if you are a  current or former smoker.  Osteoporosis. You may be screened starting at age 70 if you are at high risk. Talk with your health care provider about your test results, treatment options, and if necessary, the need for more tests. Vaccines  Your health care provider may recommend certain vaccines, such as:  Influenza vaccine. This is recommended every year.  Tetanus, diphtheria, and acellular pertussis (Tdap, Td) vaccine. You may need a Td booster every 10 years.  Zoster vaccine. You may need this after age 70.  Pneumococcal 13-valent conjugate (PCV13) vaccine. One dose is recommended after age 65.  Pneumococcal polysaccharide (PPSV23) vaccine. One dose is recommended after age 67. Talk to your health care provider about which screenings and vaccines you need and how often you need them. This information is not intended to replace advice given to you by your health care provider. Make sure you discuss any questions you have with your health care provider. Document Released: 05/29/2015 Document Revised: 01/20/2016 Document Reviewed: 03/03/2015 Elsevier Interactive Patient Education  2017 [place] Prevention in the Home Falls can cause injuries. They can happen to people of all ages. There are many things you can do to make your home safe and to help prevent falls. What can I do on the outside of my home?  Regularly fix the edges of walkways and driveways and fix any cracks.  Remove anything that might make you trip as you walk through a door, such as a raised step or threshold.  Trim any bushes or trees on the path to your home.  Use bright outdoor lighting.  Clear any walking paths of anything that might make someone trip, such as rocks or tools.  Regularly check to see if handrails are loose or broken. Make sure that both sides of any steps have handrails.  Any raised decks and porches should have guardrails on the edges.  Have any leaves, snow, or ice cleared  regularly.  Use sand or salt on walking paths during winter.  Clean up any spills in your garage right away. This includes oil or grease spills. What can I do in the bathroom?  Use night lights.  Install grab bars by the toilet and in the tub and shower. Do not use towel bars as grab bars.  Use non-skid mats or decals in the tub or shower.  If you need to sit down in the shower, use a plastic, non-slip stool.  Keep the floor dry. Clean up any water that spills on the floor as soon as it happens.  Remove soap buildup in the tub or shower regularly.  Attach bath mats securely with double-sided non-slip rug tape.  Do not have throw rugs and other things on the floor that can make you trip. What can I do in the bedroom?  Use night lights.  Make sure that you have a light by your bed that is easy to reach.  Do not use any sheets or blankets that are too big for your bed. They should not hang down onto the floor.  Have a firm chair that has side arms. You can use this for support while you get dressed.  Do not have throw rugs and other things on the floor that can  make you trip. What can I do in the kitchen?  Clean up any spills right away.  Avoid walking on wet floors.  Keep items that you use a lot in easy-to-reach places.  If you need to reach something above you, use a strong step stool that has a grab bar.  Keep electrical cords out of the way.  Do not use floor polish or wax that makes floors slippery. If you must use wax, use non-skid floor wax.  Do not have throw rugs and other things on the floor that can make you trip. What can I do with my stairs?  Do not leave any items on the stairs.  Make sure that there are handrails on both sides of the stairs and use them. Fix handrails that are broken or loose. Make sure that handrails are as long as the stairways.  Check any carpeting to make sure that it is firmly attached to the stairs. Fix any carpet that is loose  or worn.  Avoid having throw rugs at the top or bottom of the stairs. If you do have throw rugs, attach them to the floor with carpet tape.  Make sure that you have a light switch at the top of the stairs and the bottom of the stairs. If you do not have them, ask someone to add them for you. What else can I do to help prevent falls?  Wear shoes that:  Do not have high heels.  Have rubber bottoms.  Are comfortable and fit you well.  Are closed at the toe. Do not wear sandals.  If you use a stepladder:  Make sure that it is fully opened. Do not climb a closed stepladder.  Make sure that both sides of the stepladder are locked into place.  Ask someone to hold it for you, if possible.  Clearly mark and make sure that you can see:  Any grab bars or handrails.  First and last steps.  Where the edge of each step is.  Use tools that help you move around (mobility aids) if they are needed. These include:  Canes.  Walkers.  Scooters.  Crutches.  Turn on the lights when you go into a dark area. Replace any light bulbs as soon as they burn out.  Set up your furniture so you have a clear path. Avoid moving your furniture around.  If any of your floors are uneven, fix them.  If there are any pets around you, be aware of where they are.  Review your medicines with your doctor. Some medicines can make you feel dizzy. This can increase your chance of falling. Ask your doctor what other things that you can do to help prevent falls. This information is not intended to replace advice given to you by your health care provider. Make sure you discuss any questions you have with your health care provider. Document Released: 02/26/2009 Document Revised: 10/08/2015 Document Reviewed: 06/06/2014 Elsevier Interactive Patient Education  2017 [employer].  Steps to Quit Smoking Smoking tobacco can be bad for your health. It can also affect almost every organ in your body. Smoking puts  you and people around you at risk for many serious long-lasting (chronic) diseases. Quitting smoking is hard, but it is one of the best things that you can do for your health. It is never too late to quit. What are the benefits of quitting smoking? When you quit smoking, you lower your risk for getting serious diseases and conditions. They can  include:  Lung cancer or lung disease.  Heart disease.  Stroke.  Heart attack.  Not being able to have children (infertility).  Weak bones (osteoporosis) and broken bones (fractures).  If you have coughing, wheezing, and shortness of breath, those symptoms may get better when you quit. You may also get sick less often. If you are pregnant, quitting smoking can help to lower your chances of having a baby of low birth weight. What can I do to help me quit smoking? Talk with your doctor about what can help you quit smoking. Some things you can do (strategies) include:  Quitting smoking totally, instead of slowly cutting back how much you smoke over a period of time.  Going to in-person counseling. You are more likely to quit if you go to many counseling sessions.  Using resources and support systems, such as: ? Database administrator with a Social worker. ? Phone quitlines. ? Careers information officer. ? Support groups or group counseling. ? Text messaging programs. ? Mobile phone apps or applications.  Taking medicines. Some of these medicines may have nicotine in them. If you are pregnant or breastfeeding, do not take any medicines to quit smoking unless your doctor says it is okay. Talk with your doctor about counseling or other things that can help you.  Talk with your doctor about using more than one strategy at the same time, such as taking medicines while you are also going to in-person counseling. This can help make quitting easier. What things can I do to make it easier to quit? Quitting smoking might feel very hard at first, but there is a lot  that you can do to make it easier. Take these steps:  Talk to your family and friends. Ask them to support and encourage you.  Call phone quitlines, reach out to support groups, or work with a Social worker.  Ask people who smoke to not smoke around you.  Avoid places that make you want (trigger) to smoke, such as: ? Bars. ? Parties. ? Smoke-break areas at work.  Spend time with people who do not smoke.  Lower the stress in your life. Stress can make you want to smoke. Try these things to help your stress: ? Getting regular exercise. ? Deep-breathing exercises. ? Yoga. ? Meditating. ? Doing a body scan. To do this, close your eyes, focus on one area of your body at a time from head to toe, and notice which parts of your body are tense. Try to relax the muscles in those areas.  Download or buy apps on your mobile phone or tablet that can help you stick to your quit plan. There are many free apps, such as QuitGuide from the [employer] Office manager for Disease Control and Prevention). You can find more support from smokefree.gov and other websites.  This information is not intended to replace advice given to you by your health care provider. Make sure you discuss any questions you have with your health care provider. Document Released: 02/26/2009 Document Revised: 12/29/2015 Document Reviewed: 09/16/2014 Elsevier Interactive Patient Education  2018 [employer].

## 2017-07-31 ENCOUNTER — Ambulatory Visit: Payer: Medicare Other | Admitting: Unknown Physician Specialty

## 2017-09-05 ENCOUNTER — Encounter: Payer: Self-pay | Admitting: Unknown Physician Specialty

## 2017-09-05 ENCOUNTER — Ambulatory Visit (INDEPENDENT_AMBULATORY_CARE_PROVIDER_SITE_OTHER): Payer: Medicare Other | Admitting: Unknown Physician Specialty

## 2017-09-05 VITALS — BP 126/82 | HR 79 | Temp 98.4°F | Ht 68.0 in | Wt 191.6 lb

## 2017-09-05 DIAGNOSIS — Z7189 Other specified counseling: Secondary | ICD-10-CM | POA: Diagnosis not present

## 2017-09-05 DIAGNOSIS — M65331 Trigger finger, right middle finger: Secondary | ICD-10-CM | POA: Diagnosis not present

## 2017-09-05 DIAGNOSIS — E781 Pure hyperglyceridemia: Secondary | ICD-10-CM

## 2017-09-05 DIAGNOSIS — F17200 Nicotine dependence, unspecified, uncomplicated: Secondary | ICD-10-CM | POA: Diagnosis not present

## 2017-09-05 DIAGNOSIS — I10 Essential (primary) hypertension: Secondary | ICD-10-CM

## 2017-09-05 MED ORDER — ATORVASTATIN CALCIUM 20 MG PO TABS: 20.0000 mg | ORAL_TABLET | Freq: Every day | ORAL | 3 refills | Status: DC

## 2017-09-05 NOTE — Assessment & Plan Note (Signed)
A voluntary discussion about advance care planning including the explanation and discussion of advance directives was extensively discussed  with the patient.  Explanation about the health care proxy and Living will was reviewed and packet with forms with explanation of how to fill them out was given.  During this discussion, the patient was able to identify a health care proxy as his daughters and plans to fill out the paperwork required.  Patient was offered a separate [place] visit for further assistance with forms.  Time spent:  20      Individuals present: patient

## 2017-09-05 NOTE — Progress Notes (Addendum)
BP 126/82   Pulse 79   Temp 98.4 F (36.9 C) (Oral)   Ht 5\' 8"  (1.727 m)   Wt 191 lb 9.6 oz (86.9 kg)   SpO2 97%   BMI 29.13 kg/m    Subjective:    Patient ID: Brandon Hart, male    DOB: 1946/04/28, 72 y.o.   MRN: [id number]  HPI: Mourad Cwikla is a 72 y.o. male  Chief Complaint  Patient presents with  . Annual Exam    pt had wellness exam with [facility] 07/14/17   Hypertension Using medications without difficulty Average home BPs   No problems or lightheadedness No chest pain with exertion or shortness of breath No Edema  The 10-year ASCVD risk score Mikey Bussing DC Jr., et al., 2013) is: 29%   Values used to calculate the score:     Age: 57 years     Sex: Male     Is Non-Hispanic African American: No     Diabetic: No     Tobacco smoker: Yes     Systolic Blood Pressure: 527 mmHg     Is BP treated: Yes     HDL Cholesterol: 33 mg/dL     Total Cholesterol: 214 mg/dL  Depression screen [facility] 2/9 07/14/2017 07/13/2016 04/28/2015  Decreased Interest 1 0 0  Down, Depressed, Hopeless 1 0 0  PHQ - 2 Score 2 0 0  Altered sleeping 1 0 -  Tired, decreased energy 1 1 -  Change in appetite 0 0 -  Feeling bad or failure about yourself  0 0 -  Trouble concentrating 0 0 -  Moving slowly or fidgety/restless 0 0 -  Suicidal thoughts 0 0 -  PHQ-9 Score 4 1 -  Difficult doing work/chores Not difficult at all - -   COPD Pt states breathing is "about the same."  Has used his inhaler "a few times."  Smoking 1 ppd. Needs low dose CT   Relevant past medical, surgical, family and social history reviewed and updated as indicated. Interim medical history since our last visit reviewed. Allergies and medications reviewed and updated.  Review of Systems  Constitutional: Negative.   HENT: Negative.   Respiratory: Negative.   Gastrointestinal: Negative.   Musculoskeletal:       Right middle trigger finger with contracture    Per HPI unless specifically indicated above       Objective:    BP 126/82   Pulse 79   Temp 98.4 F (36.9 C) (Oral)   Ht 5\' 8"  (1.727 m)   Wt 191 lb 9.6 oz (86.9 kg)   SpO2 97%   BMI 29.13 kg/m   Wt Readings from Last 3 Encounters:  09/05/17 191 lb 9.6 oz (86.9 kg)  07/14/17 192 lb 8 oz (87.3 kg)  07/13/16 189 lb 9.6 oz (86 kg)    Physical Exam  Constitutional: He is oriented to person, place, and time. He appears well-developed and well-nourished.  HENT:  Head: Normocephalic.  Right Ear: Tympanic membrane, external ear and ear canal normal.  Left Ear: Tympanic membrane, external ear and ear canal normal.  Mouth/Throat: Uvula is midline, oropharynx is clear and moist and mucous membranes are normal.  Eyes: Pupils are equal, round, and reactive to light.  Cardiovascular: Normal rate, regular rhythm and normal heart sounds. Exam reveals no gallop and no friction rub.  No murmur heard. Pulmonary/Chest: Effort normal and breath sounds normal. No respiratory distress.  Abdominal: Soft. Bowel sounds are normal. He exhibits  no distension. There is no tenderness.  Genitourinary:  Genitourinary Comments: Refused prostate exam  Musculoskeletal: Normal range of motion.  Neurological: He is alert and oriented to person, place, and time. He has normal reflexes.  Skin: Skin is warm and dry.  Psychiatric: He has a normal mood and affect. His behavior is normal. Judgment and thought content normal.    Results for orders placed or performed in visit on 07/18/16  Lipid Panel w/o Chol/HDL Ratio  Result Value Ref Range   Cholesterol, Total 214 (H) 100 - 199 mg/dL   Triglycerides 264 (H) 0 - 149 mg/dL   HDL 33 (L) >39 mg/dL   VLDL Cholesterol Cal 53 (H) 5 - 40 mg/dL   LDL Calculated 128 (H) 0 - 99 mg/dL      Assessment & Plan:   Problem List Items Addressed This Visit      Unprioritized   Advanced care planning/counseling discussion - Primary    A voluntary discussion about advance care planning including the explanation and  discussion of advance directives was extensively discussed  with the patient.  Explanation about the health care proxy and Living will was reviewed and packet with forms with explanation of how to fill them out was given.  During this discussion, the patient was able to identify a health care proxy as his daughters and plans to fill out the paperwork required.  Patient was offered a separate [place] visit for further assistance with forms.  Time spent:  20      Individuals present: patient       Hyperlipidemia    Reviewed ASCVD risk.  Pt agrees to start Atorvastatin.  Recheck in 3 months      Relevant Medications   atorvastatin (LIPITOR) 20 MG tablet   Other Relevant Orders   Lipid Panel w/o Chol/HDL Ratio   Hypertension    Stable, continue present medications.        Relevant Medications   atorvastatin (LIPITOR) 20 MG tablet   Other Relevant Orders   Comprehensive metabolic panel   Tobacco dependence     I have recommended absolute tobacco cessation. I have discussed various options available for assistance with tobacco cessation including over the counter methods (Nicotine gum, patch and lozenges). We also discussed prescription options (Chantix, Nicotine Inhaler / Nasal Spray). The patient is not interested in pursuing any prescription tobacco cessation options at this time.  Low dose CT       Trigger finger, right middle finger    Refer to Orthopedics for further management      Relevant Orders   Ambulatory referral to Orthopedic Surgery      HM Low dose CT Refused PNA Ordered Shinrx Colonoscopy due 2027  Follow up plan: Return in about 3 months (around 12/05/2017).

## 2017-09-05 NOTE — Assessment & Plan Note (Signed)
Refer to Orthopedics for further management

## 2017-09-05 NOTE — Assessment & Plan Note (Addendum)
I have recommended absolute tobacco cessation. I have discussed various options available for assistance with tobacco cessation including over the counter methods (Nicotine gum, patch and lozenges). We also discussed prescription options (Chantix, Nicotine Inhaler / Nasal Spray). The patient is not interested in pursuing any prescription tobacco cessation options at this time.  Low dose CT

## 2017-09-05 NOTE — Addendum Note (Signed)
Addended by: [name] on: 09/05/2017 11:09 AM   Modules accepted: Orders

## 2017-09-05 NOTE — Assessment & Plan Note (Signed)
Stable, continue present medications.   

## 2017-09-05 NOTE — Assessment & Plan Note (Signed)
Reviewed ASCVD risk.  Pt agrees to start Atorvastatin.  Recheck in 3 months

## 2017-09-06 ENCOUNTER — Telehealth: Payer: Self-pay | Admitting: *Deleted

## 2017-09-06 DIAGNOSIS — Z122 Encounter for screening for malignant neoplasm of respiratory organs: Secondary | ICD-10-CM

## 2017-09-06 DIAGNOSIS — Z87891 Personal history of nicotine dependence: Secondary | ICD-10-CM

## 2017-09-06 LAB — COMPREHENSIVE METABOLIC PANEL
A/G RATIO: 1.8 (ref 1.2–2.2)
ALT: 18 IU/L (ref 0–44)
AST: 19 IU/L (ref 0–40)
Albumin: 4.6 g/dL (ref 3.6–4.8)
Alkaline Phosphatase: 87 IU/L (ref 39–117)
BILIRUBIN TOTAL: 0.4 mg/dL (ref 0.0–1.2)
BUN/Creatinine Ratio: 17 (ref 10–24)
BUN: 16 mg/dL (ref 8–27)
CHLORIDE: 100 mmol/L (ref 96–106)
CO2: 27 mmol/L (ref 20–29)
Calcium: 9.4 mg/dL (ref 8.6–10.2)
Creatinine, Ser: 0.94 mg/dL (ref 0.76–1.27)
GFR calc Af Amer: 95 mL/min/{1.73_m2} (ref >59)
GFR calc non Af Amer: 82 mL/min/{1.73_m2} (ref >59)
GLUCOSE: 86 mg/dL (ref 65–99)
Globulin, Total: 2.5 g/dL (ref 1.5–4.5)
POTASSIUM: 4.3 mmol/L (ref 3.5–5.2)
Sodium: 141 mmol/L (ref 134–144)
Total Protein: 7.1 g/dL (ref 6.0–8.5)

## 2017-09-06 LAB — LIPID PANEL W/O CHOL/HDL RATIO
Cholesterol, Total: 199 mg/dL (ref 100–199)
HDL: 33 mg/dL — AB (ref >39)
LDL Calculated: 116 mg/dL — ABNORMAL HIGH (ref 0–99)
TRIGLYCERIDES: 252 mg/dL — AB (ref 0–149)
VLDL CHOLESTEROL CAL: 50 mg/dL — AB (ref 5–40)

## 2017-09-06 NOTE — Telephone Encounter (Signed)
Received referral for initial lung cancer screening scan. Contacted patient and obtained smoking history,(current, 50 pack year) as well as answering questions related to screening process. Patient denies signs of lung cancer such as weight loss or hemoptysis. Patient denies comorbidity that would prevent curative treatment if lung cancer were found. Patient is scheduled for shared decision making visit and CT scan on 09/19/17.

## 2017-09-08 ENCOUNTER — Encounter: Payer: Self-pay | Admitting: Unknown Physician Specialty

## 2017-09-19 ENCOUNTER — Ambulatory Visit
Admission: RE | Admit: 2017-09-19 | Discharge: 2017-09-19 | Disposition: A | Discharge status: Home / Self Care | Payer: Medicare Other | Source: Ambulatory Visit | Attending: Oncology | Admitting: Oncology

## 2017-09-19 ENCOUNTER — Inpatient Hospital Stay: Payer: Medicare Other | Attending: Oncology | Admitting: Oncology

## 2017-09-19 ENCOUNTER — Encounter: Payer: Self-pay | Admitting: Oncology

## 2017-09-19 DIAGNOSIS — Z87891 Personal history of nicotine dependence: Secondary | ICD-10-CM

## 2017-09-19 DIAGNOSIS — F17210 Nicotine dependence, cigarettes, uncomplicated: Secondary | ICD-10-CM | POA: Diagnosis not present

## 2017-09-19 DIAGNOSIS — J439 Emphysema, unspecified: Secondary | ICD-10-CM | POA: Insufficient documentation

## 2017-09-19 DIAGNOSIS — I700 Atherosclerosis of aorta: Secondary | ICD-10-CM | POA: Insufficient documentation

## 2017-09-19 DIAGNOSIS — N62 Hypertrophy of breast: Secondary | ICD-10-CM | POA: Insufficient documentation

## 2017-09-19 DIAGNOSIS — Z122 Encounter for screening for malignant neoplasm of respiratory organs: Secondary | ICD-10-CM

## 2017-09-19 NOTE — Progress Notes (Signed)
In accordance with CMS guidelines, patient has met eligibility criteria including age, absence of signs or symptoms of lung cancer.  Social History   Tobacco Use  . Smoking status: Current Every Day Smoker    Packs/day: 1.00    Years: 50.00    Pack years: 50.00    Types: Cigarettes  . Smokeless tobacco: Never Used  Substance Use Topics  . Alcohol use: No    Comment: pt states he has not drink anything in about a year  . Drug use: No     A shared decision-making session was conducted prior to the performance of CT scan. This includes one or more decision aids, includes benefits and harms of screening, follow-up diagnostic testing, over-diagnosis, false positive rate, and total radiation exposure.  Counseling on the importance of adherence to annual lung cancer LDCT screening, impact of co-morbidities, and ability or willingness to undergo diagnosis and treatment is imperative for compliance of the program.  Counseling on the importance of continued smoking cessation for former smokers; the importance of smoking cessation for current smokers, and information about tobacco cessation interventions have been given to patient including [place] and 1800 quit [place] programs.  Written order for lung cancer screening with LDCT has been given to the patient and any and all questions have been answered to the best of my abilities.   Yearly follow up will be coordinated by Burgess Estelle, Thoracic Navigator.  Faythe Casa, NP 09/19/2017 3:08 PM

## 2017-09-22 ENCOUNTER — Encounter: Payer: Self-pay | Admitting: *Deleted

## 2017-09-30 ENCOUNTER — Other Ambulatory Visit: Payer: Self-pay | Admitting: Unknown Physician Specialty

## 2017-10-05 DIAGNOSIS — M79644 Pain in right finger(s): Secondary | ICD-10-CM | POA: Diagnosis not present

## 2017-10-24 DIAGNOSIS — M720 Palmar fascial fibromatosis [Dupuytren]: Secondary | ICD-10-CM | POA: Diagnosis not present

## 2017-12-05 ENCOUNTER — Ambulatory Visit: Payer: Medicare Other | Admitting: Unknown Physician Specialty

## 2017-12-12 ENCOUNTER — Ambulatory Visit (INDEPENDENT_AMBULATORY_CARE_PROVIDER_SITE_OTHER): Payer: Medicare Other | Admitting: Unknown Physician Specialty

## 2017-12-12 ENCOUNTER — Encounter: Payer: Self-pay | Admitting: Unknown Physician Specialty

## 2017-12-12 DIAGNOSIS — I10 Essential (primary) hypertension: Secondary | ICD-10-CM

## 2017-12-12 DIAGNOSIS — E781 Pure hyperglyceridemia: Secondary | ICD-10-CM | POA: Diagnosis not present

## 2017-12-12 MED ORDER — LISINOPRIL-HYDROCHLOROTHIAZIDE 20-25 MG PO TABS: 1.0000 | ORAL_TABLET | Freq: Every day | ORAL | 1 refills | Status: DC

## 2017-12-12 MED ORDER — AMLODIPINE BESYLATE 5 MG PO TABS: 5.0000 mg | ORAL_TABLET | Freq: Every day | ORAL | 1 refills | Status: DC

## 2017-12-12 NOTE — Assessment & Plan Note (Addendum)
Not fasting today.  Will check lipids.  Adjust statin as needed

## 2017-12-12 NOTE — Assessment & Plan Note (Signed)
Stable, continue present medications.   

## 2017-12-12 NOTE — Progress Notes (Signed)
BP 109/74   Pulse 99   Temp 98.2 F (36.8 C) (Oral)   Ht 5\' 8"  (1.727 m)   Wt 190 lb 9.6 oz (86.5 kg)   SpO2 97%   BMI 28.98 kg/m    Subjective:    Patient ID: Brandon Hart, male    DOB: Mar 07, 1946, 72 y.o.   MRN: [id number]  HPI: Brandon Hart is a 72 y.o. male  Chief Complaint  Patient presents with  . Hyperlipidemia  . Hypertension   Hypertension Using medications without difficulty Average home BPs Not checking   No problems or lightheadedness No chest pain with exertion or shortness of breath No Edema  Hyperlipidemia High last visit so started taking cholesterol meds.   Using medications without problems: No Muscle aches  Diet compliance:Exercise:eats well.  Does some walking Not fasting today   Relevant past medical, surgical, family and social history reviewed and updated as indicated. Interim medical history since our last visit reviewed. Allergies and medications reviewed and updated.  Review of Systems  Constitutional: Negative.   HENT: Negative.   Eyes: Negative.   Respiratory: Negative.   Cardiovascular: Negative.   Gastrointestinal: Negative.   Endocrine: Negative.   Genitourinary: Negative.   Skin: Negative.   Allergic/Immunologic: Negative.   Neurological: Negative.   Hematological: Negative.   Psychiatric/Behavioral: Negative.     Per HPI unless specifically indicated above     Objective:    BP 109/74   Pulse 99   Temp 98.2 F (36.8 C) (Oral)   Ht 5\' 8"  (1.727 m)   Wt 190 lb 9.6 oz (86.5 kg)   SpO2 97%   BMI 28.98 kg/m   Wt Readings from Last 3 Encounters:  12/12/17 190 lb 9.6 oz (86.5 kg)  09/19/17 191 lb (86.6 kg)  09/05/17 191 lb 9.6 oz (86.9 kg)    Physical Exam  Constitutional: He is oriented to person, place, and time. He appears well-developed and well-nourished. No distress.  HENT:  Head: Normocephalic and atraumatic.  Eyes: Conjunctivae and lids are normal. Right eye exhibits no discharge.  Left eye exhibits no discharge. No scleral icterus.  Neck: Normal range of motion. Neck supple. No JVD present. Carotid bruit is not present.  Cardiovascular: Normal rate, regular rhythm and normal heart sounds.  Pulmonary/Chest: Effort normal and breath sounds normal. No respiratory distress.  Abdominal: Normal appearance. There is no splenomegaly or hepatomegaly.  Musculoskeletal: Normal range of motion.  Neurological: He is alert and oriented to person, place, and time.  Skin: Skin is warm, dry and intact. No rash noted. No pallor.  Psychiatric: He has a normal mood and affect. His behavior is normal. Judgment and thought content normal.    Results for orders placed or performed in visit on 09/05/17  Comprehensive metabolic panel  Result Value Ref Range   Glucose 86 65 - 99 mg/dL   BUN 16 8 - 27 mg/dL   Creatinine, Ser 0.94 0.76 - 1.27 mg/dL   GFR calc non Af Amer 82 >59 mL/min/1.73   GFR calc Af Amer 95 >59 mL/min/1.73   BUN/Creatinine Ratio 17 10 - 24   Sodium 141 134 - 144 mmol/L   Potassium 4.3 3.5 - 5.2 mmol/L   Chloride 100 96 - 106 mmol/L   CO2 27 20 - 29 mmol/L   Calcium 9.4 8.6 - 10.2 mg/dL   Total Protein 7.1 6.0 - 8.5 g/dL   Albumin 4.6 3.6 - 4.8 g/dL   Globulin, Total 2.5 1.5 -  4.5 g/dL   Albumin/Globulin Ratio 1.8 1.2 - 2.2   Bilirubin Total 0.4 0.0 - 1.2 mg/dL   Alkaline Phosphatase 87 39 - 117 IU/L   AST 19 0 - 40 IU/L   ALT 18 0 - 44 IU/L  Lipid Panel w/o Chol/HDL Ratio  Result Value Ref Range   Cholesterol, Total 199 100 - 199 mg/dL   Triglycerides 252 (H) 0 - 149 mg/dL   HDL 33 (L) >39 mg/dL   VLDL Cholesterol Cal 50 (H) 5 - 40 mg/dL   LDL Calculated 116 (H) 0 - 99 mg/dL      Assessment & Plan:   Problem List Items Addressed This Visit      Unprioritized   Hyperlipidemia    Not fasting today.  Will check lipids.  Adjust statin as needed      Relevant Medications   lisinopril-hydrochlorothiazide (PRINZIDE,ZESTORETIC) 20-25 MG tablet    amLODipine (NORVASC) 5 MG tablet   Other Relevant Orders   Lipid Panel w/o Chol/HDL Ratio   Hypertension    Stable, continue present medications.        Relevant Medications   lisinopril-hydrochlorothiazide (PRINZIDE,ZESTORETIC) 20-25 MG tablet   amLODipine (NORVASC) 5 MG tablet   Other Relevant Orders   Comprehensive metabolic panel       Follow up plan: Return in about 6 months (around 06/14/2018) for physical.

## 2017-12-13 ENCOUNTER — Encounter: Payer: Self-pay | Admitting: Unknown Physician Specialty

## 2017-12-13 LAB — COMPREHENSIVE METABOLIC PANEL
A/G RATIO: 1.9 (ref 1.2–2.2)
ALT: 17 IU/L (ref 0–44)
AST: 15 IU/L (ref 0–40)
Albumin: 4.3 g/dL (ref 3.6–4.8)
Alkaline Phosphatase: 93 IU/L (ref 39–117)
BILIRUBIN TOTAL: 0.3 mg/dL (ref 0.0–1.2)
BUN/Creatinine Ratio: 13 (ref 10–24)
BUN: 15 mg/dL (ref 8–27)
CALCIUM: 9.4 mg/dL (ref 8.6–10.2)
CHLORIDE: 97 mmol/L (ref 96–106)
CO2: 25 mmol/L (ref 20–29)
Creatinine, Ser: 1.12 mg/dL (ref 0.76–1.27)
GFR calc non Af Amer: 67 mL/min/{1.73_m2} (ref >59)
GFR, EST AFRICAN AMERICAN: 77 mL/min/{1.73_m2} (ref >59)
GLUCOSE: 128 mg/dL — AB (ref 65–99)
Globulin, Total: 2.3 g/dL (ref 1.5–4.5)
POTASSIUM: 3.7 mmol/L (ref 3.5–5.2)
Sodium: 139 mmol/L (ref 134–144)
TOTAL PROTEIN: 6.6 g/dL (ref 6.0–8.5)

## 2017-12-13 LAB — LIPID PANEL W/O CHOL/HDL RATIO
Cholesterol, Total: 132 mg/dL (ref 100–199)
HDL: 30 mg/dL — ABNORMAL LOW (ref >39)
LDL CALC: 50 mg/dL (ref 0–99)
TRIGLYCERIDES: 261 mg/dL — AB (ref 0–149)
VLDL Cholesterol Cal: 52 mg/dL — ABNORMAL HIGH (ref 5–40)

## 2018-06-15 ENCOUNTER — Encounter: Payer: Self-pay | Admitting: Nurse Practitioner

## 2018-06-15 ENCOUNTER — Ambulatory Visit (INDEPENDENT_AMBULATORY_CARE_PROVIDER_SITE_OTHER): Payer: Medicare Other | Admitting: Nurse Practitioner

## 2018-06-15 VITALS — BP 113/76 | HR 82 | Temp 97.9°F | Ht 68.5 in | Wt 194.4 lb

## 2018-06-15 DIAGNOSIS — J449 Chronic obstructive pulmonary disease, unspecified: Secondary | ICD-10-CM

## 2018-06-15 DIAGNOSIS — I10 Essential (primary) hypertension: Secondary | ICD-10-CM

## 2018-06-15 DIAGNOSIS — E781 Pure hyperglyceridemia: Secondary | ICD-10-CM

## 2018-06-15 DIAGNOSIS — F17210 Nicotine dependence, cigarettes, uncomplicated: Secondary | ICD-10-CM | POA: Diagnosis not present

## 2018-06-15 NOTE — Patient Instructions (Signed)
Coping with Quitting Smoking  Quitting smoking is a physical and mental challenge. You will face cravings, withdrawal symptoms, and temptation. Before quitting, work with your health care provider to make a plan that can help you cope. Preparation can help you quit and keep you from giving in. How can I cope with cravings? Cravings usually last for 5-10 minutes. If you get through it, the craving will pass. Consider taking the following actions to help you cope with cravings:  Keep your mouth busy: ? Chew sugar-free gum. ? Suck on hard candies or a straw. ? Brush your teeth.  Keep your hands and body busy: ? Immediately change to a different activity when you feel a craving. ? Squeeze or play with a ball. ? Do an activity or a hobby, like making bead jewelry, practicing needlepoint, or working with wood. ? Mix up your normal routine. ? Take a short exercise break. Go for a quick walk or run up and down stairs. ? Spend time in public places where smoking is not allowed.  Focus on doing something kind or helpful for someone else.  Call a friend or family member to talk during a craving.  Join a support group.  Call a quit line, such as 1-800-QUIT-NOW.  Talk with your health care provider about medicines that might help you cope with cravings and make quitting easier for you. How can I deal with withdrawal symptoms? Your body may experience negative effects as it tries to get used to not having nicotine in the system. These effects are called withdrawal symptoms. They may include:  Feeling hungrier than normal.  Trouble concentrating.  Irritability.  Trouble sleeping.  Feeling depressed.  Restlessness and agitation.  Craving a cigarette. To manage withdrawal symptoms:  Avoid places, people, and activities that trigger your cravings.  Remember why you want to quit.  Get plenty of sleep.  Avoid coffee and other caffeinated drinks. These may worsen some of your symptoms.  How can I handle social situations? Social situations can be difficult when you are quitting smoking, especially in the first few weeks. To manage this, you can:  Avoid parties, bars, and other social situations where people might be smoking.  Avoid alcohol.  Leave right away if you have the urge to smoke.  Explain to your family and friends that you are quitting smoking. Ask for understanding and support.  Plan activities with friends or family where smoking is not an option. What are some ways I can cope with stress? Wanting to smoke may cause stress, and stress can make you want to smoke. Find ways to manage your stress. Relaxation techniques can help. For example:  Breathe slowly and deeply, in through your nose and out through your mouth.  Listen to soothing, relaxing music.  Talk with a family member or friend about your stress.  Light a candle.  Soak in a bath or take a shower.  Think about a peaceful place. What are some ways I can prevent weight gain? Be aware that many people gain weight after they quit smoking. However, not everyone does. To keep from gaining weight, have a plan in place before you quit and stick to the plan after you quit. Your plan should include:  Having healthy snacks. When you have a craving, it may help to: ? Eat plain popcorn, crunchy carrots, celery, or other cut vegetables. ? Chew sugar-free gum.  Changing how you eat: ? Eat small portion sizes at meals. ? Eat 4-6 small meals   throughout the day instead of 1-2 large meals a day. ? Be mindful when you eat. Do not watch television or do other things that might distract you as you eat.  Exercising regularly: ? Make time to exercise each day. If you do not have time for a long workout, do short bouts of exercise for 5-10 minutes several times a day. ? Do some form of strengthening exercise, like weight lifting, and some form of aerobic exercise, like running or swimming.  Drinking plenty of  water or other low-calorie or no-calorie drinks. Drink 6-8 glasses of water daily, or as much as instructed by your health care provider. Summary  Quitting smoking is a physical and mental challenge. You will face cravings, withdrawal symptoms, and temptation to smoke again. Preparation can help you as you go through these challenges.  You can cope with cravings by keeping your mouth busy (such as by chewing gum), keeping your body and hands busy, and making calls to family, friends, or a helpline for people who want to quit smoking.  You can cope with withdrawal symptoms by avoiding places where people smoke, avoiding drinks with caffeine, and getting plenty of rest.  Ask your health care provider about the different ways to prevent weight gain, avoid stress, and handle social situations. This information is not intended to replace advice given to you by your health care provider. Make sure you discuss any questions you have with your health care provider. Document Released: 04/29/2016 Document Revised: 04/29/2016 Document Reviewed: 04/29/2016 Elsevier Interactive Patient Education  2019 [employer]  

## 2018-06-15 NOTE — Progress Notes (Signed)
BP 113/76 (BP Location: Left Arm, Patient Position: Sitting, Cuff Size: Normal)   Pulse 82   Temp 97.9 F (36.6 C) (Oral)   Ht 5' 8.5" (1.74 m)   Wt 194 lb 6.4 oz (88.2 kg)   SpO2 98%   BMI 29.13 kg/m    Subjective:    Patient ID: Brandon Hart, male    DOB: 05-18-1945, 73 y.o.   MRN: [id number]  HPI: Brandon Hart is a 73 y.o. male  Chief Complaint  Patient presents with  . Follow-up    Insurance will not cover Annual Exam   HYPERTENSION / HYPERLIPIDEMIA Continues on Lipitor and Norvasc + Lisinopril-HCTZ. Satisfied with current treatment? yes Duration of hypertension: chronic BP monitoring frequency: not checking BP range:  BP medication side effects: no Duration of hyperlipidemia: chronic Cholesterol medication side effects: no Cholesterol supplements: none Medication compliance: good compliance Aspirin: no Recent stressors: no Recurrent headaches: no Visual changes: no Palpitations: no Dyspnea: no Chest pain: no Lower extremity edema: no Dizzy/lightheaded: no   COPD Continues to smoke 1 pack per day.  Reports "I know I need to" about quitting, but reports he has "never really tried".  Had lung CT screening in May 2019, due May 2020. COPD status: controlled Satisfied with current treatment?: no current medications, denies need Oxygen use: no Dyspnea frequency: none Cough frequency: "once and awhile", mainly in the morning Rescue inhaler frequency:  none Limitation of activity: no Productive cough:  Last Spirometry:  Pneumovax: unknown Influenza: unknown   CYST RIGHT JAW LINE: Reports history for having lipoma to top of scalp that was removed.  States this area to jaw line has been present for 2 years and has not changed in size.  Reports no pain or color changes.  States sometime is "opens and drains a little".  Recommended referral to general surgery for further evaluation and removal.  Discussed d/t smoking history this would be  beneficial.  He refuses referral at this time and was able to verbalize understanding of recommendations.  Depression screen [facility] 2/9 06/15/2018 07/14/2017 07/13/2016 04/28/2015  Decreased Interest 0 1 0 0  Down, Depressed, Hopeless 0 1 0 0  PHQ - 2 Score 0 2 0 0  Altered sleeping 0 1 0 -  Tired, decreased energy 0 1 1 -  Change in appetite 0 0 0 -  Feeling bad or failure about yourself  0 0 0 -  Trouble concentrating 0 0 0 -  Moving slowly or fidgety/restless 0 0 0 -  Suicidal thoughts 0 0 0 -  PHQ-9 Score 0 4 1 -  Difficult doing work/chores Not difficult at all Not difficult at all - -    Relevant past medical, surgical, family and social history reviewed and updated as indicated. Interim medical history since our last visit reviewed. Allergies and medications reviewed and updated.  Review of Systems  Constitutional: Negative for activity change, diaphoresis, fatigue and fever.  Respiratory: Negative for cough, chest tightness, shortness of breath and wheezing.   Cardiovascular: Negative for chest pain, palpitations and leg swelling.  Gastrointestinal: Negative for abdominal distention, abdominal pain, constipation, diarrhea, nausea and vomiting.  Endocrine: Negative for cold intolerance, heat intolerance, polydipsia, polyphagia and polyuria.  Musculoskeletal: Negative.   Skin: Negative.   Neurological: Negative for dizziness, syncope, weakness, light-headedness, numbness and headaches.  Psychiatric/Behavioral: Negative.     Per HPI unless specifically indicated above     Objective:    BP 113/76 (BP Location: Left Arm, Patient  Position: Sitting, Cuff Size: Normal)   Pulse 82   Temp 97.9 F (36.6 C) (Oral)   Ht 5' 8.5" (1.74 m)   Wt 194 lb 6.4 oz (88.2 kg)   SpO2 98%   BMI 29.13 kg/m   Wt Readings from Last 3 Encounters:  06/15/18 194 lb 6.4 oz (88.2 kg)  12/12/17 190 lb 9.6 oz (86.5 kg)  09/19/17 191 lb (86.6 kg)    Physical Exam Vitals signs and nursing note  reviewed.  Constitutional:      General: He is awake.     Appearance: He is well-developed.  HENT:     Head: Normocephalic and atraumatic.     Right Ear: Hearing, tympanic membrane, ear canal and external ear normal. No drainage.     Left Ear: Hearing, tympanic membrane, ear canal and external ear normal. No drainage.     Nose: Nose normal.     Right Sinus: No maxillary sinus tenderness or frontal sinus tenderness.     Left Sinus: No maxillary sinus tenderness or frontal sinus tenderness.     Mouth/Throat:     Lips: Pink.     Mouth: Mucous membranes are moist.     Pharynx: Oropharynx is clear. Uvula midline. No posterior oropharyngeal erythema.  Eyes:     General: Lids are normal.        Right eye: No discharge.        Left eye: No discharge.     Extraocular Movements: Extraocular movements intact.     Conjunctiva/sclera: Conjunctivae normal.     Pupils: Pupils are equal, round, and reactive to light.  Neck:     Musculoskeletal: Normal range of motion and neck supple.     Thyroid: No thyromegaly.     Vascular: No carotid bruit or JVD.     Trachea: Trachea normal.  Cardiovascular:     Rate and Rhythm: Normal rate and regular rhythm.     Heart sounds: Normal heart sounds, S1 normal and S2 normal. No murmur. No gallop.   Pulmonary:     Effort: Pulmonary effort is normal.     Breath sounds: Normal breath sounds.  Abdominal:     General: Bowel sounds are normal.     Palpations: Abdomen is soft. There is no hepatomegaly or splenomegaly.  Musculoskeletal: Normal range of motion.     Right lower leg: No edema.     Left lower leg: No edema.  Lymphadenopathy:     Head:     Right side of head: No submental, submandibular, tonsillar, preauricular or posterior auricular adenopathy.     Left side of head: No submental, submandibular, tonsillar, preauricular or posterior auricular adenopathy.     Cervical: No cervical adenopathy.  Skin:    General: Skin is warm and dry.     Capillary  Refill: Capillary refill takes less than 2 seconds.     Findings: No rash.       Neurological:     Mental Status: He is alert and oriented to person, place, and time.     Cranial Nerves: Cranial nerves are intact.     Motor: Motor function is intact.     Coordination: Coordination is intact.     Gait: Gait is intact.     Deep Tendon Reflexes: Reflexes are normal and symmetric.  Psychiatric:        Attention and Perception: Attention normal.        Mood and Affect: Mood normal.  Speech: Speech normal.        Behavior: Behavior normal. Behavior is cooperative.        Thought Content: Thought content normal.        Judgment: Judgment normal.     Results for orders placed or performed in visit on 12/12/17  Comprehensive metabolic panel  Result Value Ref Range   Glucose 128 (H) 65 - 99 mg/dL   BUN 15 8 - 27 mg/dL   Creatinine, Ser 1.12 0.76 - 1.27 mg/dL   GFR calc non Af Amer 67 >59 mL/min/1.73   GFR calc Af Amer 77 >59 mL/min/1.73   BUN/Creatinine Ratio 13 10 - 24   Sodium 139 134 - 144 mmol/L   Potassium 3.7 3.5 - 5.2 mmol/L   Chloride 97 96 - 106 mmol/L   CO2 25 20 - 29 mmol/L   Calcium 9.4 8.6 - 10.2 mg/dL   Total Protein 6.6 6.0 - 8.5 g/dL   Albumin 4.3 3.6 - 4.8 g/dL   Globulin, Total 2.3 1.5 - 4.5 g/dL   Albumin/Globulin Ratio 1.9 1.2 - 2.2   Bilirubin Total 0.3 0.0 - 1.2 mg/dL   Alkaline Phosphatase 93 39 - 117 IU/L   AST 15 0 - 40 IU/L   ALT 17 0 - 44 IU/L  Lipid Panel w/o Chol/HDL Ratio  Result Value Ref Range   Cholesterol, Total 132 100 - 199 mg/dL   Triglycerides 261 (H) 0 - 149 mg/dL   HDL 30 (L) >39 mg/dL   VLDL Cholesterol Cal 52 (H) 5 - 40 mg/dL   LDL Calculated 50 0 - 99 mg/dL      Assessment & Plan:   Problem List Items Addressed This Visit      Cardiovascular and Mediastinum   Hypertension - Primary    Chronic, ongoing.  Continue current medication regimen.        Relevant Orders   CBC w/Diff   Comprehensive metabolic panel      Respiratory   COPD, mild (HCC)    Chronic, ongoing.   No current nebulizer.  CT scan due in May 2020.  Continue to encourage smoking cessation.        Other   Nicotine dependence, cigarettes, uncomplicated    I have recommended complete cessation of tobacco use. I have discussed various options available for assistance with tobacco cessation including over the counter methods (Nicotine gum, patch and lozenges). We also discussed prescription options (Chantix, Nicotine Inhaler / Nasal Spray). The patient is not interested in pursuing any prescription tobacco cessation options at this time.      Hyperlipidemia    Not fasting today.  Lipid panel obtained.  Chronic, ongoing.  Continue current medication regimen.        Relevant Orders   Lipid Panel w/o Chol/HDL Ratio      Time: 3 minutes spent discussing smoking cessation  Follow up plan: Return in about 6 months (around 12/14/2018) for HTN, HLD, COPD.

## 2018-06-15 NOTE — Assessment & Plan Note (Signed)
Chronic, ongoing.  Continue current medication regimen.   

## 2018-06-15 NOTE — Assessment & Plan Note (Signed)
I have recommended complete cessation of tobacco use. I have discussed various options available for assistance with tobacco cessation including over the counter methods (Nicotine gum, patch and lozenges). We also discussed prescription options (Chantix, Nicotine Inhaler / Nasal Spray). The patient is not interested in pursuing any prescription tobacco cessation options at this time.  

## 2018-06-15 NOTE — Assessment & Plan Note (Signed)
Chronic, ongoing.   No current nebulizer.  CT scan due in May 2020.  Continue to encourage smoking cessation.

## 2018-06-15 NOTE — Assessment & Plan Note (Signed)
Not fasting today.  Lipid panel obtained.  Chronic, ongoing.  Continue current medication regimen.

## 2018-06-16 LAB — COMPREHENSIVE METABOLIC PANEL
ALK PHOS: 93 IU/L (ref 39–117)
ALT: 13 IU/L (ref 0–44)
AST: 11 IU/L (ref 0–40)
Albumin/Globulin Ratio: 1.9 (ref 1.2–2.2)
Albumin: 4.4 g/dL (ref 3.8–4.8)
BILIRUBIN TOTAL: 0.3 mg/dL (ref 0.0–1.2)
BUN / CREAT RATIO: 17 (ref 10–24)
BUN: 17 mg/dL (ref 8–27)
CHLORIDE: 99 mmol/L (ref 96–106)
CO2: 26 mmol/L (ref 20–29)
CREATININE: 1 mg/dL (ref 0.76–1.27)
Calcium: 9.2 mg/dL (ref 8.6–10.2)
GFR calc Af Amer: 88 mL/min/{1.73_m2} (ref >59)
GFR calc non Af Amer: 76 mL/min/{1.73_m2} (ref >59)
GLUCOSE: 83 mg/dL (ref 65–99)
Globulin, Total: 2.3 g/dL (ref 1.5–4.5)
Potassium: 4.2 mmol/L (ref 3.5–5.2)
Sodium: 140 mmol/L (ref 134–144)
Total Protein: 6.7 g/dL (ref 6.0–8.5)

## 2018-06-16 LAB — LIPID PANEL W/O CHOL/HDL RATIO
CHOLESTEROL TOTAL: 182 mg/dL (ref 100–199)
HDL: 27 mg/dL — ABNORMAL LOW (ref >39)
TRIGLYCERIDES: 441 mg/dL — AB (ref 0–149)

## 2018-06-16 LAB — CBC WITH DIFFERENTIAL/PLATELET
Basophils Absolute: 0.1 10*3/uL (ref 0.0–0.2)
Basos: 1 %
EOS (ABSOLUTE): 0.1 10*3/uL (ref 0.0–0.4)
Eos: 2 %
Hematocrit: 38.3 % (ref 37.5–51.0)
Hemoglobin: 13.4 g/dL (ref 13.0–17.7)
Immature Grans (Abs): 0.1 10*3/uL (ref 0.0–0.1)
Immature Granulocytes: 1 %
LYMPHS ABS: 2.9 10*3/uL (ref 0.7–3.1)
Lymphs: 35 %
MCH: 30.5 pg (ref 26.6–33.0)
MCHC: 35 g/dL (ref 31.5–35.7)
MCV: 87 fL (ref 79–97)
MONOCYTES: 8 %
Monocytes Absolute: 0.7 10*3/uL (ref 0.1–0.9)
NEUTROS ABS: 4.5 10*3/uL (ref 1.4–7.0)
Neutrophils: 53 %
Platelets: 216 10*3/uL (ref 150–450)
RBC: 4.39 x10E6/uL (ref 4.14–5.80)
RDW: 12.9 % (ref 11.6–15.4)
WBC: 8.3 10*3/uL (ref 3.4–10.8)

## 2018-07-16 ENCOUNTER — Ambulatory Visit: Payer: Self-pay

## 2018-07-17 ENCOUNTER — Other Ambulatory Visit: Payer: Self-pay | Admitting: Unknown Physician Specialty

## 2018-07-17 NOTE — Telephone Encounter (Signed)
Requested Prescriptions  Pending Prescriptions Disp Refills  . lisinopril-hydrochlorothiazide (PRINZIDE,ZESTORETIC) 20-25 MG tablet [Pharmacy Med Name: LISINOPRIL-HCTZ 20-25 MG TAB] 90 tablet 1    Sig: Take 1 tablet by mouth daily.     Cardiovascular:  ACEI + Diuretic Combos Passed - 07/17/2018  9:16 AM      Passed - Na in normal range and within 180 days    Sodium  Date Value Ref Range Status  06/15/2018 140 134 - 144 mmol/L Final         Passed - K in normal range and within 180 days    Potassium  Date Value Ref Range Status  06/15/2018 4.2 3.5 - 5.2 mmol/L Final         Passed - Cr in normal range and within 180 days    Creatinine, Ser  Date Value Ref Range Status  06/15/2018 1.00 0.76 - 1.27 mg/dL Final         Passed - Ca in normal range and within 180 days    Calcium  Date Value Ref Range Status  06/15/2018 9.2 8.6 - 10.2 mg/dL Final         Passed - Patient is not pregnant      Passed - Last BP in normal range    BP Readings from Last 1 Encounters:  06/15/18 113/76         Passed - Valid encounter within last 6 months    Recent Outpatient Visits          1 month ago Essential hypertension   [place], Henrine Screws T, NP   7 months ago Essential hypertension   [place], Cheryl, NP   10 months ago Advanced care planning/counseling discussion   [facility] [name], NP   2 years ago Annual physical exam   [facility] [name], NP   2 years ago Tinea cruris   [place] Crissman, Jeannette How, MD      Future Appointments            In 5 months [name], [name], NP [employer], [facility]

## 2018-08-17 ENCOUNTER — Encounter: Payer: Self-pay | Admitting: *Deleted

## 2018-09-06 ENCOUNTER — Encounter: Payer: Self-pay | Admitting: *Deleted

## 2018-10-05 ENCOUNTER — Other Ambulatory Visit: Payer: Self-pay | Admitting: Unknown Physician Specialty

## 2018-10-05 NOTE — Telephone Encounter (Signed)
Requested medication (s) are due for refill today -yes   Requested medication (s) are on the active medication list -yes  Future visit scheduled -yes  Last refill: 3 months ago  Notes to clinic: Patient meets criteria for RF- can we update PCP on Rx so it is correct for future RF.  Requested Prescriptions  Pending Prescriptions Disp Refills   amLODipine (NORVASC) 5 MG tablet [Pharmacy Med Name: AMLODIPINE BESYLATE 5 MG TAB] 90 tablet 0    Sig: Take 1 tablet (5 mg total) by mouth daily.     Cardiovascular:  Calcium Channel Blockers Passed - 10/05/2018  9:31 AM      Passed - Last BP in normal range    BP Readings from Last 1 Encounters:  06/15/18 113/76         Passed - Valid encounter within last 6 months    Recent Outpatient Visits          3 months ago Essential hypertension   [place], [place] T, NP   9 months ago Essential hypertension   [place] [name], NP   1 year ago Advanced care planning/counseling discussion   [facility] [name], NP   2 years ago Annual physical exam   [facility] [name], NP   2 years ago Tinea cruris   [place], MD      Future Appointments            In 2 months [name], [name], NP [place]            Requested Prescriptions  Pending Prescriptions Disp Refills   amLODipine (NORVASC) 5 MG tablet [Pharmacy Med Name: AMLODIPINE BESYLATE 5 MG TAB] 90 tablet 0    Sig: Take 1 tablet (5 mg total) by mouth daily.     Cardiovascular:  Calcium Channel Blockers Passed - 10/05/2018  9:31 AM      Passed - Last BP in normal range    BP Readings from Last 1 Encounters:  06/15/18 113/76         Passed - Valid encounter within last 6 months    Recent Outpatient Visits          3 months ago Essential hypertension   [place], [name], NP   9 months ago Essential hypertension   [place] [name], NP   1 year ago Advanced care planning/counseling discussion   [facility] [name], NP   2 years ago Annual physical exam   [facility] [name], NP   2 years ago Tinea cruris   [place] Crissman, Jeannette How, MD      Future Appointments            In 2 months [name], [name], NP [employer], [facility]

## 2018-10-26 ENCOUNTER — Telehealth: Payer: Self-pay

## 2018-10-26 ENCOUNTER — Telehealth: Payer: Self-pay | Admitting: *Deleted

## 2018-10-26 DIAGNOSIS — Z87891 Personal history of nicotine dependence: Secondary | ICD-10-CM

## 2018-10-26 DIAGNOSIS — Z122 Encounter for screening for malignant neoplasm of respiratory organs: Secondary | ICD-10-CM

## 2018-10-26 NOTE — Telephone Encounter (Signed)
Patient has been notified that annual lung cancer screening low dose CT scan is due currently or will be in near future. Confirmed that patient is within the age range of 55-77, and asymptomatic, (no signs or symptoms of lung cancer). Patient denies illness that would prevent curative treatment for lung cancer if found. Verified smoking history, (current, 51 pack year). The shared decision making visit was done 09/19/17. Patient is agreeable for CT scan being scheduled.

## 2018-10-26 NOTE — Telephone Encounter (Signed)
Call pt  Regarding lung screening. Pt is a current smoker , smoking about 1 pack per day. Pt denies any new health issues. Pt would like scan in two weeks in the afternoon.

## 2018-11-08 ENCOUNTER — Ambulatory Visit: Admission: RE | Admit: 2018-11-08 | Payer: Medicare Other | Source: Ambulatory Visit

## 2018-12-14 ENCOUNTER — Other Ambulatory Visit: Payer: Self-pay

## 2018-12-14 ENCOUNTER — Ambulatory Visit (INDEPENDENT_AMBULATORY_CARE_PROVIDER_SITE_OTHER): Payer: Medicare Other | Admitting: Nurse Practitioner

## 2018-12-14 ENCOUNTER — Encounter: Payer: Self-pay | Admitting: Nurse Practitioner

## 2018-12-14 DIAGNOSIS — E781 Pure hyperglyceridemia: Secondary | ICD-10-CM

## 2018-12-14 DIAGNOSIS — F17210 Nicotine dependence, cigarettes, uncomplicated: Secondary | ICD-10-CM

## 2018-12-14 DIAGNOSIS — J449 Chronic obstructive pulmonary disease, unspecified: Secondary | ICD-10-CM

## 2018-12-14 DIAGNOSIS — I10 Essential (primary) hypertension: Secondary | ICD-10-CM

## 2018-12-14 NOTE — Assessment & Plan Note (Signed)
Chronic, stable without current maintenance medications.  Continue to recommend complete smoking cessation.  He does not wish to start inhaler regimen at this time.  Plan on spirometry next visit.

## 2018-12-14 NOTE — Progress Notes (Signed)
There were no vitals taken for this visit.   Subjective:    Patient ID: Brandon Hart, male    DOB: 18-Oct-1945, 73 y.o.   MRN: [id number]  HPI: Brandon Hart is a 73 y.o. male  Chief Complaint  Patient presents with  . Hyperlipidemia  . Hypertension    . This visit was completed via telephone due to the restrictions of the COVID-19 pandemic. All issues as above were discussed and addressed but no physical exam was performed. If it was felt that the patient should be evaluated in the office, they were directed there. The patient verbally consented to this visit. Patient was unable to complete an audio/visual visit due to Lack of equipment. Due to the catastrophic nature of the COVID-19 pandemic, this visit was done through audio contact only. . Location of the patient: home . Location of the provider: home . Those involved with this call:  . Provider: Marnee Guarneri, DNP . CMA: Yvonna Alanis, CMA . Front Desk/Registration: Jill Side  . Time spent on call: 15 minutes on the phone discussing health concerns. 10 minutes total spent in review of patient's record and preparation of their chart.  . I verified patient identity using two factors (patient name and date of birth). Patient consents verbally to being seen via telemedicine visit today.    HYPERTENSION / HYPERLIPIDEMIA Continues on Lipitor and Norvasc + Lisinopril-HCTZ.  Refuses labs at this time due to Covid 19 and wishing to minimize time outside of home, agrees to have performed at next visit. Satisfied with current treatment? yes Duration of hypertension: chronic BP monitoring frequency: not checking BP range: not checking BP medication side effects: no Duration of hyperlipidemia: chronic Cholesterol medication side effects: no Cholesterol supplements: none Medication compliance: good compliance Aspirin: no Recent stressors: no Recurrent headaches: no Visual changes: no Palpitations: no  Dyspnea: no Chest pain: no Lower extremity edema: no Dizzy/lightheaded: no   COPD Continues to smoke 1/2 pack per day, this is down from previous 1 PPD and encouraged him to continue reduction.  He is not interested in medication to assist with quitting.  Had lung CT screening in May 2019, due May 2020.  Currently no maintenance medication and reports he does not want any. COPD status: stable Satisfied with current treatment?: yes Oxygen use: no Dyspnea frequency: none Cough frequency: occasional per patient Rescue inhaler frequency:  none Limitation of activity: no Productive cough: none Last Spirometry: 2017 Pneumovax: Not up to Date Influenza: Up to Date  Relevant past medical, surgical, family and social history reviewed and updated as indicated. Interim medical history since our last visit reviewed. Allergies and medications reviewed and updated.  Review of Systems  Constitutional: Negative for activity change, diaphoresis, fatigue and fever.  Respiratory: Negative for cough, chest tightness, shortness of breath and wheezing.   Cardiovascular: Negative for chest pain, palpitations and leg swelling.  Gastrointestinal: Negative for abdominal distention, abdominal pain, constipation, diarrhea, nausea and vomiting.  Skin: Negative.   Neurological: Negative for dizziness, syncope, weakness, light-headedness, numbness and headaches.  Psychiatric/Behavioral: Negative.     Per HPI unless specifically indicated above     Objective:    There were no vitals taken for this visit.  Wt Readings from Last 3 Encounters:  06/15/18 194 lb 6.4 oz (88.2 kg)  12/12/17 190 lb 9.6 oz (86.5 kg)  09/19/17 191 lb (86.6 kg)    Physical Exam   Unable to perform due to telephone visit only.  Results for orders  placed or performed in visit on 06/15/18  CBC w/Diff  Result Value Ref Range   WBC 8.3 3.4 - 10.8 x10E3/uL   RBC 4.39 4.14 - 5.80 x10E6/uL   Hemoglobin 13.4 13.0 - 17.7 g/dL    Hematocrit 38.3 37.5 - 51.0 %   MCV 87 79 - 97 fL   MCH 30.5 26.6 - 33.0 pg   MCHC 35.0 31.5 - 35.7 g/dL   RDW 12.9 11.6 - 15.4 %   Platelets 216 150 - 450 x10E3/uL   Neutrophils 53 Not Estab. %   Lymphs 35 Not Estab. %   Monocytes 8 Not Estab. %   Eos 2 Not Estab. %   Basos 1 Not Estab. %   Neutrophils Absolute 4.5 1.4 - 7.0 x10E3/uL   Lymphocytes Absolute 2.9 0.7 - 3.1 x10E3/uL   Monocytes Absolute 0.7 0.1 - 0.9 x10E3/uL   EOS (ABSOLUTE) 0.1 0.0 - 0.4 x10E3/uL   Basophils Absolute 0.1 0.0 - 0.2 x10E3/uL   Immature Granulocytes 1 Not Estab. %   Immature Grans (Abs) 0.1 0.0 - 0.1 x10E3/uL  Comprehensive metabolic panel  Result Value Ref Range   Glucose 83 65 - 99 mg/dL   BUN 17 8 - 27 mg/dL   Creatinine, Ser 1.00 0.76 - 1.27 mg/dL   GFR calc non Af Amer 76 >59 mL/min/1.73   GFR calc Af Amer 88 >59 mL/min/1.73   BUN/Creatinine Ratio 17 10 - 24   Sodium 140 134 - 144 mmol/L   Potassium 4.2 3.5 - 5.2 mmol/L   Chloride 99 96 - 106 mmol/L   CO2 26 20 - 29 mmol/L   Calcium 9.2 8.6 - 10.2 mg/dL   Total Protein 6.7 6.0 - 8.5 g/dL   Albumin 4.4 3.8 - 4.8 g/dL   Globulin, Total 2.3 1.5 - 4.5 g/dL   Albumin/Globulin Ratio 1.9 1.2 - 2.2   Bilirubin Total 0.3 0.0 - 1.2 mg/dL   Alkaline Phosphatase 93 39 - 117 IU/L   AST 11 0 - 40 IU/L   ALT 13 0 - 44 IU/L  Lipid Panel w/o Chol/HDL Ratio  Result Value Ref Range   Cholesterol, Total 182 100 - 199 mg/dL   Triglycerides 441 (H) 0 - 149 mg/dL   HDL 27 (L) >39 mg/dL   VLDL Cholesterol Cal Comment 5 - 40 mg/dL   LDL Calculated Comment 0 - 99 mg/dL      Assessment & Plan:   Problem List Items Addressed This Visit      Cardiovascular and Mediastinum   Hypertension    Chronic, ongoing.  Recommend he start checking BP at home three mornings a week and documenting for provider.  Discussed goal BP with him.  Continue current medication regimen.  Return in 6 months for physical and labs.        Respiratory   COPD, mild (HCC) - Primary     Chronic, stable without current maintenance medications.  Continue to recommend complete smoking cessation.  He does not wish to start inhaler regimen at this time.  Plan on spirometry next visit.        Other   Nicotine dependence, cigarettes, uncomplicated    I have recommended complete cessation of tobacco use. I have discussed various options available for assistance with tobacco cessation including over the counter methods (Nicotine gum, patch and lozenges). We also discussed prescription options (Chantix, Nicotine Inhaler / Nasal Spray). The patient is not interested in pursuing any prescription tobacco cessation options at this time.  Hyperlipidemia    Chronic, ongoing.  Continue current medication regimen and adjust as needed.  Plan on lipid panel next visit as last panel he was not fasting.           I discussed the assessment and treatment plan with the patient. The patient was provided an opportunity to ask questions and all were answered. The patient agreed with the plan and demonstrated an understanding of the instructions.   The patient was advised to call back or seek an in-person evaluation if the symptoms worsen or if the condition fails to improve as anticipated.   I provided 15 minutes of time during this encounter.  Follow up plan: Return in about 6 months (around 06/16/2019) for COPD, HTN/HLD (annual physical) + spirometry.

## 2018-12-14 NOTE — Assessment & Plan Note (Signed)
I have recommended complete cessation of tobacco use. I have discussed various options available for assistance with tobacco cessation including over the counter methods (Nicotine gum, patch and lozenges). We also discussed prescription options (Chantix, Nicotine Inhaler / Nasal Spray). The patient is not interested in pursuing any prescription tobacco cessation options at this time.  

## 2018-12-14 NOTE — Patient Instructions (Signed)
Coping with Quitting Smoking  Quitting smoking is a physical and mental challenge. You will face cravings, withdrawal symptoms, and temptation. Before quitting, work with your health care provider to make a plan that can help you cope. Preparation can help you quit and keep you from giving in. How can I cope with cravings? Cravings usually last for 5-10 minutes. If you get through it, the craving will pass. Consider taking the following actions to help you cope with cravings:  Keep your mouth busy: ? Chew sugar-free gum. ? Suck on hard candies or a straw. ? Brush your teeth.  Keep your hands and body busy: ? Immediately change to a different activity when you feel a craving. ? Squeeze or play with a ball. ? Do an activity or a hobby, like making bead jewelry, practicing needlepoint, or working with wood. ? Mix up your normal routine. ? Take a short exercise break. Go for a quick walk or run up and down stairs. ? Spend time in public places where smoking is not allowed.  Focus on doing something kind or helpful for someone else.  Call a friend or family member to talk during a craving.  Join a support group.  Call a quit line, such as 1-800-QUIT-NOW.  Talk with your health care provider about medicines that might help you cope with cravings and make quitting easier for you. How can I deal with withdrawal symptoms? Your body may experience negative effects as it tries to get used to not having nicotine in the system. These effects are called withdrawal symptoms. They may include:  Feeling hungrier than normal.  Trouble concentrating.  Irritability.  Trouble sleeping.  Feeling depressed.  Restlessness and agitation.  Craving a cigarette. To manage withdrawal symptoms:  Avoid places, people, and activities that trigger your cravings.  Remember why you want to quit.  Get plenty of sleep.  Avoid coffee and other caffeinated drinks. These may worsen some of your symptoms.  How can I handle social situations? Social situations can be difficult when you are quitting smoking, especially in the first few weeks. To manage this, you can:  Avoid parties, bars, and other social situations where people might be smoking.  Avoid alcohol.  Leave right away if you have the urge to smoke.  Explain to your family and friends that you are quitting smoking. Ask for understanding and support.  Plan activities with friends or family where smoking is not an option. What are some ways I can cope with stress? Wanting to smoke may cause stress, and stress can make you want to smoke. Find ways to manage your stress. Relaxation techniques can help. For example:  Breathe slowly and deeply, in through your nose and out through your mouth.  Listen to soothing, relaxing music.  Talk with a family member or friend about your stress.  Light a candle.  Soak in a bath or take a shower.  Think about a peaceful place. What are some ways I can prevent weight gain? Be aware that many people gain weight after they quit smoking. However, not everyone does. To keep from gaining weight, have a plan in place before you quit and stick to the plan after you quit. Your plan should include:  Having healthy snacks. When you have a craving, it may help to: ? Eat plain popcorn, crunchy carrots, celery, or other cut vegetables. ? Chew sugar-free gum.  Changing how you eat: ? Eat small portion sizes at meals. ? Eat 4-6 small meals   throughout the day instead of 1-2 large meals a day. ? Be mindful when you eat. Do not watch television or do other things that might distract you as you eat.  Exercising regularly: ? Make time to exercise each day. If you do not have time for a long workout, do short bouts of exercise for 5-10 minutes several times a day. ? Do some form of strengthening exercise, like weight lifting, and some form of aerobic exercise, like running or swimming.  Drinking plenty of  water or other low-calorie or no-calorie drinks. Drink 6-8 glasses of water daily, or as much as instructed by your health care provider. Summary  Quitting smoking is a physical and mental challenge. You will face cravings, withdrawal symptoms, and temptation to smoke again. Preparation can help you as you go through these challenges.  You can cope with cravings by keeping your mouth busy (such as by chewing gum), keeping your body and hands busy, and making calls to family, friends, or a helpline for people who want to quit smoking.  You can cope with withdrawal symptoms by avoiding places where people smoke, avoiding drinks with caffeine, and getting plenty of rest.  Ask your health care provider about the different ways to prevent weight gain, avoid stress, and handle social situations. This information is not intended to replace advice given to you by your health care provider. Make sure you discuss any questions you have with your health care provider. Document Released: 04/29/2016 Document Revised: 04/14/2017 Document Reviewed: 04/29/2016 Elsevier Patient Education  2020 [employer]  

## 2018-12-14 NOTE — Assessment & Plan Note (Signed)
Chronic, ongoing.  Continue current medication regimen and adjust as needed.  Plan on lipid panel next visit as last panel he was not fasting.

## 2018-12-14 NOTE — Assessment & Plan Note (Signed)
Chronic, ongoing.  Recommend he start checking BP at home three mornings a week and documenting for provider.  Discussed goal BP with him.  Continue current medication regimen.  Return in 6 months for physical and labs.

## 2018-12-17 ENCOUNTER — Telehealth: Payer: Self-pay | Admitting: *Deleted

## 2018-12-17 NOTE — Telephone Encounter (Signed)
Patient is contacted and rescheduled for lung screening scan 12/27/18 at 1pm.

## 2018-12-27 ENCOUNTER — Ambulatory Visit: Payer: Medicare Other

## 2019-01-03 ENCOUNTER — Other Ambulatory Visit: Payer: Self-pay | Admitting: Nurse Practitioner

## 2019-01-03 ENCOUNTER — Other Ambulatory Visit: Payer: Self-pay | Admitting: Unknown Physician Specialty

## 2019-04-04 ENCOUNTER — Other Ambulatory Visit: Payer: Self-pay | Admitting: Unknown Physician Specialty

## 2019-04-23 ENCOUNTER — Encounter: Payer: Self-pay | Admitting: *Deleted

## 2019-06-19 ENCOUNTER — Other Ambulatory Visit: Payer: Self-pay

## 2019-06-19 ENCOUNTER — Encounter: Payer: Self-pay | Admitting: Nurse Practitioner

## 2019-06-19 ENCOUNTER — Ambulatory Visit (INDEPENDENT_AMBULATORY_CARE_PROVIDER_SITE_OTHER): Payer: PPO | Admitting: Nurse Practitioner

## 2019-06-19 VITALS — BP 113/73 | HR 93 | Temp 98.3°F | Ht 68.2 in | Wt 192.6 lb

## 2019-06-19 DIAGNOSIS — Z125 Encounter for screening for malignant neoplasm of prostate: Secondary | ICD-10-CM | POA: Diagnosis not present

## 2019-06-19 DIAGNOSIS — I10 Essential (primary) hypertension: Secondary | ICD-10-CM

## 2019-06-19 DIAGNOSIS — F17210 Nicotine dependence, cigarettes, uncomplicated: Secondary | ICD-10-CM

## 2019-06-19 DIAGNOSIS — Z7189 Other specified counseling: Secondary | ICD-10-CM | POA: Diagnosis not present

## 2019-06-19 DIAGNOSIS — Z0000 Encounter for general adult medical examination without abnormal findings: Secondary | ICD-10-CM | POA: Diagnosis not present

## 2019-06-19 DIAGNOSIS — J449 Chronic obstructive pulmonary disease, unspecified: Secondary | ICD-10-CM

## 2019-06-19 DIAGNOSIS — E781 Pure hyperglyceridemia: Secondary | ICD-10-CM

## 2019-06-19 MED ORDER — ALBUTEROL SULFATE (2.5 MG/3ML) 0.083% IN NEBU
2.5000 mg | INHALATION_SOLUTION | Freq: Once | RESPIRATORY_TRACT | Status: AC
  Administered 2019-06-19: 2.5 mg via RESPIRATORY_TRACT

## 2019-06-19 MED ORDER — AMLODIPINE BESYLATE 5 MG PO TABS: 5.0000 mg | ORAL_TABLET | Freq: Every day | ORAL | 3 refills | Status: DC

## 2019-06-19 MED ORDER — ATORVASTATIN CALCIUM 20 MG PO TABS: 20.0000 mg | ORAL_TABLET | Freq: Every day | ORAL | 3 refills | Status: DC

## 2019-06-19 MED ORDER — ALBUTEROL SULFATE HFA 108 (90 BASE) MCG/ACT IN AERS: 2.0000 | INHALATION_SPRAY | Freq: Four times a day (QID) | RESPIRATORY_TRACT | 4 refills | Status: DC | PRN

## 2019-06-19 MED ORDER — LISINOPRIL-HYDROCHLOROTHIAZIDE 20-25 MG PO TABS: 1.0000 | ORAL_TABLET | Freq: Every day | ORAL | 3 refills | Status: DC

## 2019-06-19 NOTE — Assessment & Plan Note (Signed)
Chronic, ongoing.  Continue current medication regimen and adjust as needed.  Lipid panel today.  Refills sent.  Return in 6 months.

## 2019-06-19 NOTE — Patient Instructions (Signed)
Chronic Obstructive Pulmonary Disease Chronic obstructive pulmonary disease (COPD) is a long-term (chronic) lung problem. When you have COPD, it is hard for air to get in and out of your lungs. Usually the condition gets worse over time, and your lungs will never return to normal. There are things you can do to keep yourself as healthy as possible.  Your doctor may treat your condition with: ? Medicines. ? Oxygen. ? Lung surgery.  Your doctor may also recommend: ? Rehabilitation. This includes steps to make your body work better. It may involve a team of specialists. ? Quitting smoking, if you smoke. ? Exercise and changes to your diet. ? Comfort measures (palliative care). Follow these instructions at home: Medicines  Take over-the-counter and prescription medicines only as told by your doctor.  Talk to your doctor before taking any cough or allergy medicines. You may need to avoid medicines that cause your lungs to be dry. Lifestyle  If you smoke, stop. Smoking makes the problem worse. If you need help quitting, ask your doctor.  Avoid being around things that make your breathing worse. This may include smoke, chemicals, and fumes.  Stay active, but remember to rest as well.  Learn and use tips on how to relax.  Make sure you get enough sleep. Most adults need at least 7 hours of sleep every night.  Eat healthy foods. Eat smaller meals more often. Rest before meals. Controlled breathing Learn and use tips on how to control your breathing as told by your doctor. Try:  Breathing in (inhaling) through your nose for 1 second. Then, pucker your lips and breath out (exhale) through your lips for 2 seconds.  Putting one hand on your belly (abdomen). Breathe in slowly through your nose for 1 second. Your hand on your belly should move out. Pucker your lips and breathe out slowly through your lips. Your hand on your belly should move in as you breathe out.  Controlled coughing Learn  and use controlled coughing to clear mucus from your lungs. Follow these steps: 1. Lean your head a little forward. 2. Breathe in deeply. 3. Try to hold your breath for 3 seconds. 4. Keep your mouth slightly open while coughing 2 times. 5. Spit any mucus out into a tissue. 6. Rest and do the steps again 1 or 2 times as needed. General instructions  Make sure you get all the shots (vaccines) that your doctor recommends. Ask your doctor about a flu shot and a pneumonia shot.  Use oxygen therapy and pulmonary rehabilitation if told by your doctor. If you need home oxygen therapy, ask your doctor if you should buy a tool to measure your oxygen level (oximeter).  Make a COPD action plan with your doctor. This helps you to know what to do if you feel worse than usual.  Manage any other conditions you have as told by your doctor.  Avoid going outside when it is very hot, cold, or humid.  Avoid people who have a sickness you can catch (contagious).  Keep all follow-up visits as told by your doctor. This is important. Contact a doctor if:  You cough up more mucus than usual.  There is a change in the color or thickness of the mucus.  It is harder to breathe than usual.  Your breathing is faster than usual.  You have trouble sleeping.  You need to use your medicines more often than usual.  You have trouble doing your normal activities such as getting dressed   or walking around the house. Get help right away if:  You have shortness of breath while resting.  You have shortness of breath that stops you from: ? Being able to talk. ? Doing normal activities.  Your chest hurts for longer than 5 minutes.  Your skin color is more blue than usual.  Your pulse oximeter shows that you have low oxygen for longer than 5 minutes.  You have a fever.  You feel too tired to breathe normally. Summary  Chronic obstructive pulmonary disease (COPD) is a long-term lung problem.  The way your  lungs work will never return to normal. Usually the condition gets worse over time. There are things you can do to keep yourself as healthy as possible.  Take over-the-counter and prescription medicines only as told by your doctor.  If you smoke, stop. Smoking makes the problem worse. This information is not intended to replace advice given to you by your health care provider. Make sure you discuss any questions you have with your health care provider. Document Revised: 04/14/2017 Document Reviewed: 06/06/2016 Elsevier Patient Education  2020 [employer]  

## 2019-06-19 NOTE — Progress Notes (Signed)
BP 113/73   Pulse 93   Temp 98.3 F (36.8 C) (Oral)   Ht 5' 8.2" (1.732 m)   Wt 192 lb 9.6 oz (87.4 kg)   SpO2 97%   BMI 29.11 kg/m    Subjective:    Patient ID: Brandon Hart, male    DOB: 27-Apr-1946, 74 y.o.   MRN: [id number]  HPI: Brandon Hart is a 74 y.o. male presenting on 06/19/2019 for comprehensive medical examination. Current medical complaints include:none  He currently lives with: self Interim Problems from his last visit: no   HYPERTENSION / HYPERLIPIDEMIA Continues on Lipitor and Norvasc + Lisinopril-HCTZ.  Refuses labs at this time due to Covid 19 and wishing to minimize time outside of home, agrees to have performed at next visit. Satisfied with current treatment? yes Duration of hypertension: chronic BP monitoring frequency: not checking BP range: not checking BP medication side effects: no Duration of hyperlipidemia: chronic Cholesterol medication side effects: no Cholesterol supplements: none Medication compliance: good compliance Aspirin: no Recent stressors: no Recurrent headaches: no Visual changes: no Palpitations: no Dyspnea: no Chest pain: no Lower extremity edema: no Dizzy/lightheaded: no   COPD Continues to smoke 1/2 pack per day, this is down from previous 1 PPD and encouraged him to continue reduction.  He is not interested in medication to assist with quitting. Had lung CT screening in May 2019.  Currently no maintenance medication.  Does endorse occasional wheezing at night before bed and occasional cough in morning. COPD status: stable Satisfied with current treatment?: yes Oxygen use: no Dyspnea frequency: none Cough frequency: occasional per patient Rescue inhaler frequency:  none Limitation of activity: no Productive cough: none Last Spirometry: today 06/19/2019 Pneumovax: Not up to Date Influenza: Not Up to Date   Functional Status Survey: Is the patient deaf or have difficulty hearing?: No Does the  patient have difficulty seeing, even when wearing glasses/contacts?: No Does the patient have difficulty concentrating, remembering, or making decisions?: No Does the patient have difficulty walking or climbing stairs?: No Does the patient have difficulty dressing or bathing?: No Does the patient have difficulty doing errands alone such as visiting a doctor's office or shopping?: No  FALL RISK: Fall Risk  06/19/2019 06/15/2018 07/14/2017 07/13/2016 05/08/2015  Falls in the past year? 0 - No No No  Number falls in past yr: 0 0 - - -  Injury with Fall? 0 0 - - -  Follow up Falls evaluation completed Falls evaluation completed - - -    Depression Screen Depression screen [facility] 2/9 06/19/2019 06/15/2018 07/14/2017 07/13/2016 04/28/2015  Decreased Interest 0 0 1 0 0  Down, Depressed, Hopeless 0 0 1 0 0  PHQ - 2 Score 0 0 2 0 0  Altered sleeping - 0 1 0 -  Tired, decreased energy - 0 1 1 -  Change in appetite - 0 0 0 -  Feeling bad or failure about yourself  - 0 0 0 -  Trouble concentrating - 0 0 0 -  Moving slowly or fidgety/restless - 0 0 0 -  Suicidal thoughts - 0 0 0 -  PHQ-9 Score - 0 4 1 -  Difficult doing work/chores - Not difficult at all Not difficult at all - -    Advanced Directives A voluntary discussion about advance care planning including the explanation and discussion of advance directives was extensively discussed  with the patient for 15 minutes with patient.  Explanation about the health care proxy and Living  will was reviewed and packet with forms with explanation of how to fill them out was given.  During this discussion, the patient was able to identify a health care proxy as his daughter and plans to fill out the paperwork required.  Patient was offered a separate [place] visit for further assistance with forms.     Past Medical History:  Past Medical History:  Diagnosis Date  . Hypertension     Surgical History:  Past Surgical History:  Procedure Laterality  Date  . COLONOSCOPY WITH PROPOFOL N/A 04/11/2016   Procedure: COLONOSCOPY WITH PROPOFOL;  Surgeon: Jonathon Bellows, MD;  Location: [facility] ENDOSCOPY;  Service: Endoscopy;  Laterality: N/A;  . HERNIA REPAIR    . VASECTOMY      Medications:  No current outpatient medications on file prior to visit.   No current facility-administered medications on file prior to visit.    Allergies:  No Known Allergies  Social History:  Social History   Socioeconomic History  . Marital status: Single    Spouse name: Not on file  . Number of children: Not on file  . Years of education: Not on file  . Highest education level: Not on file  Occupational History  . Not on file  Tobacco Use  . Smoking status: Current Every Day Smoker    Packs/day: 1.00    Years: 50.00    Pack years: 50.00    Types: Cigarettes  . Smokeless tobacco: Never Used  Substance and Sexual Activity  . Alcohol use: No    Comment: pt states he has not drink anything in about a year  . Drug use: No  . Sexual activity: Yes  Other Topics Concern  . Not on file  Social History Narrative  . Not on file   Social Determinants of Health   Financial Resource Strain:   . Difficulty of Paying Living Expenses: Not on file  Food Insecurity:   . Worried About Charity fundraiser in the Last Year: Not on file  . Ran Out of Food in the Last Year: Not on file  Transportation Needs:   . Lack of Transportation (Medical): Not on file  . Lack of Transportation (Non-Medical): Not on file  Physical Activity:   . Days of Exercise per Week: Not on file  . Minutes of Exercise per Session: Not on file  Stress:   . Feeling of Stress : Not on file  Social Connections:   . Frequency of Communication with Friends and Family: Not on file  . Frequency of Social Gatherings with Friends and Family: Not on file  . Attends Religious Services: Not on file  . Active Member of Clubs or Organizations: Not on file  . Attends Archivist Meetings:  Not on file  . Marital Status: Not on file  Intimate Partner Violence:   . Fear of Current or Ex-Partner: Not on file  . Emotionally Abused: Not on file  . Physically Abused: Not on file  . Sexually Abused: Not on file   Social History   Tobacco Use  Smoking Status Current Every Day Smoker  . Packs/day: 1.00  . Years: 50.00  . Pack years: 50.00  . Types: Cigarettes  Smokeless Tobacco Never Used   Social History   Substance and Sexual Activity  Alcohol Use No   Comment: pt states he has not drink anything in about a year    Family History:  Family History  Problem Relation Age of Onset  .  Hypertension Mother   . Breast cancer Mother   . Hypertension Father   . Colon cancer Father   . Hypertension Brother   . Hypertension Brother     Past medical history, surgical history, medications, allergies, family history and social history reviewed with patient today and changes made to appropriate areas of the chart.   Review of Systems - negative All other ROS negative except what is listed above and in the HPI.      Objective:    BP 113/73   Pulse 93   Temp 98.3 F (36.8 C) (Oral)   Ht 5' 8.2" (1.732 m)   Wt 192 lb 9.6 oz (87.4 kg)   SpO2 97%   BMI 29.11 kg/m   Wt Readings from Last 3 Encounters:  06/19/19 192 lb 9.6 oz (87.4 kg)  06/15/18 194 lb 6.4 oz (88.2 kg)  12/12/17 190 lb 9.6 oz (86.5 kg)    Physical Exam Vitals and nursing note reviewed.  Constitutional:      General: He is awake. He is not in acute distress.    Appearance: He is well-developed. He is not ill-appearing.  HENT:     Head: Normocephalic and atraumatic.     Right Ear: Hearing, tympanic membrane, ear canal and external ear normal. No drainage.     Left Ear: Hearing, tympanic membrane, ear canal and external ear normal. No drainage.     Nose: Nose normal.     Mouth/Throat:     Pharynx: Uvula midline.  Eyes:     General: Lids are normal.        Right eye: No discharge.        Left  eye: No discharge.     Extraocular Movements: Extraocular movements intact.     Conjunctiva/sclera: Conjunctivae normal.     Pupils: Pupils are equal, round, and reactive to light.     Visual Fields: Right eye visual fields normal and left eye visual fields normal.  Neck:     Thyroid: No thyromegaly.     Vascular: No carotid bruit or JVD.     Trachea: Trachea normal.  Cardiovascular:     Rate and Rhythm: Normal rate and regular rhythm.     Heart sounds: Normal heart sounds, S1 normal and S2 normal. No murmur. No gallop.   Pulmonary:     Effort: Pulmonary effort is normal. No accessory muscle usage or respiratory distress.     Breath sounds: Normal breath sounds.     Comments: Intermittent expiratory wheezes noted throughout.   Abdominal:     General: Bowel sounds are normal.     Palpations: Abdomen is soft. There is no hepatomegaly or splenomegaly.     Tenderness: There is no abdominal tenderness.  Genitourinary:    Comments: Deferred per patient request. Musculoskeletal:        General: Normal range of motion.     Cervical back: Normal range of motion and neck supple.     Right lower leg: No edema.     Left lower leg: No edema.  Lymphadenopathy:     Head:     Right side of head: No submental, submandibular, tonsillar, preauricular or posterior auricular adenopathy.     Left side of head: No submental, submandibular, tonsillar, preauricular or posterior auricular adenopathy.     Cervical: No cervical adenopathy.  Skin:    General: Skin is warm and dry.     Capillary Refill: Capillary refill takes less than 2 seconds.  Findings: No rash.  Neurological:     Mental Status: He is alert and oriented to person, place, and time.     Cranial Nerves: Cranial nerves are intact.     Gait: Gait is intact.     Deep Tendon Reflexes: Reflexes are normal and symmetric.     Reflex Scores:      Brachioradialis reflexes are 2+ on the right side and 2+ on the left side.      Patellar  reflexes are 2+ on the right side and 2+ on the left side. Psychiatric:        Attention and Perception: Attention normal.        Mood and Affect: Mood normal.        Speech: Speech normal.        Behavior: Behavior normal. Behavior is cooperative.        Thought Content: Thought content normal.        Cognition and Memory: Cognition normal.        Judgment: Judgment normal.     Results for orders placed or performed in visit on 06/15/18  CBC w/Diff  Result Value Ref Range   WBC 8.3 3.4 - 10.8 x10E3/uL   RBC 4.39 4.14 - 5.80 x10E6/uL   Hemoglobin 13.4 13.0 - 17.7 g/dL   Hematocrit 38.3 37.5 - 51.0 %   MCV 87 79 - 97 fL   MCH 30.5 26.6 - 33.0 pg   MCHC 35.0 31.5 - 35.7 g/dL   RDW 12.9 11.6 - 15.4 %   Platelets 216 150 - 450 x10E3/uL   Neutrophils 53 Not Estab. %   Lymphs 35 Not Estab. %   Monocytes 8 Not Estab. %   Eos 2 Not Estab. %   Basos 1 Not Estab. %   Neutrophils Absolute 4.5 1.4 - 7.0 x10E3/uL   Lymphocytes Absolute 2.9 0.7 - 3.1 x10E3/uL   Monocytes Absolute 0.7 0.1 - 0.9 x10E3/uL   EOS (ABSOLUTE) 0.1 0.0 - 0.4 x10E3/uL   Basophils Absolute 0.1 0.0 - 0.2 x10E3/uL   Immature Granulocytes 1 Not Estab. %   Immature Grans (Abs) 0.1 0.0 - 0.1 x10E3/uL  Comprehensive metabolic panel  Result Value Ref Range   Glucose 83 65 - 99 mg/dL   BUN 17 8 - 27 mg/dL   Creatinine, Ser 1.00 0.76 - 1.27 mg/dL   GFR calc non Af Amer 76 >59 mL/min/1.73   GFR calc Af Amer 88 >59 mL/min/1.73   BUN/Creatinine Ratio 17 10 - 24   Sodium 140 134 - 144 mmol/L   Potassium 4.2 3.5 - 5.2 mmol/L   Chloride 99 96 - 106 mmol/L   CO2 26 20 - 29 mmol/L   Calcium 9.2 8.6 - 10.2 mg/dL   Total Protein 6.7 6.0 - 8.5 g/dL   Albumin 4.4 3.8 - 4.8 g/dL   Globulin, Total 2.3 1.5 - 4.5 g/dL   Albumin/Globulin Ratio 1.9 1.2 - 2.2   Bilirubin Total 0.3 0.0 - 1.2 mg/dL   Alkaline Phosphatase 93 39 - 117 IU/L   AST 11 0 - 40 IU/L   ALT 13 0 - 44 IU/L  Lipid Panel w/o Chol/HDL Ratio  Result Value Ref  Range   Cholesterol, Total 182 100 - 199 mg/dL   Triglycerides 441 (H) 0 - 149 mg/dL   HDL 27 (L) >39 mg/dL   VLDL Cholesterol Cal Comment 5 - 40 mg/dL   LDL Calculated Comment 0 - 99 mg/dL  Assessment & Plan:   Problem List Items Addressed This Visit      Cardiovascular and Mediastinum   Hypertension    Chronic, stable with BP at goal today.  Recommend he start checking BP at home three mornings a week and documenting for provider.  Discussed goal BP with him.  Continue current medication regimen.  Return in 6 months. Refills sent.      Relevant Medications   amLODipine (NORVASC) 5 MG tablet   atorvastatin (LIPITOR) 20 MG tablet   lisinopril-hydrochlorothiazide (ZESTORETIC) 20-25 MG tablet   Other Relevant Orders   CBC with Differential/Platelet   TSH     Respiratory   COPD, mild (HCC)    Chronic, stable without current maintenance medications.  Spirometry done today noting FEV1 71% and FEV1/FVC 70% pre.  Continue to recommend complete smoking cessation.  Will order Albuterol inhaler to use as needed, discussed with patient and he agrees with POC.   If frequent use consider addition of maintenance inhaler.  Return in 6 months.      Relevant Medications   albuterol (VENTOLIN HFA) 108 (90 Base) MCG/ACT inhaler   Other Relevant Orders   Spirometry with Graph (Completed)   CBC with Differential/Platelet     Other   Nicotine dependence, cigarettes, uncomplicated    I have recommended complete cessation of tobacco use. I have discussed various options available for assistance with tobacco cessation including over the counter methods (Nicotine gum, patch and lozenges). We also discussed prescription options (Chantix, Nicotine Inhaler / Nasal Spray). The patient is not interested in pursuing any prescription tobacco cessation options at this time. Recommended he ensure he schedule yearly CT.       Advanced care planning/counseling discussion    A voluntary discussion about  advance care planning including the explanation and discussion of advance directives was extensively discussed  with the patient for 15 minutes with patient.  Explanation about the health care proxy and Living will was reviewed and packet with forms with explanation of how to fill them out was given.  During this discussion, the patient was able to identify a health care proxy as his daughter and plans to fill out the paperwork required.  Patient was offered a separate [place] visit for further assistance with forms.        Hyperlipidemia    Chronic, ongoing.  Continue current medication regimen and adjust as needed.  Lipid panel today.  Refills sent.  Return in 6 months.      Relevant Medications   amLODipine (NORVASC) 5 MG tablet   atorvastatin (LIPITOR) 20 MG tablet   lisinopril-hydrochlorothiazide (ZESTORETIC) 20-25 MG tablet   Other Relevant Orders   Comprehensive metabolic panel   Lipid Panel w/o Chol/HDL Ratio    Other Visit Diagnoses    Annual physical exam    -  Primary   Annual labs today to include TSH, PSA, lipid, CBc, CMP   Prostate cancer screening       PSA today   Relevant Orders   PSA      Discussed aspirin prophylaxis for myocardial infarction prevention and decision was made to start ASA  LABORATORY TESTING:  Health maintenance labs ordered today as discussed above.   The natural history of prostate cancer and ongoing controversy regarding screening and potential treatment outcomes of prostate cancer has been discussed with the patient. The meaning of a false positive PSA and a false negative PSA has been discussed. He indicates understanding of the limitations of  this screening test and wishes to proceed with screening PSA testing.   IMMUNIZATIONS:   - Tdap: Tetanus vaccination status reviewed: refuses. - Influenza: Refused - Pneumovax: Refused - Prevnar: Refused - Zostavax vaccine: Refused  SCREENING: - Colonoscopy: Up to date  Discussed  with patient purpose of the colonoscopy is to detect colon cancer at curable precancerous or early stages   - AAA Screening: Refused  -Hearing Test: Not applicable  -Spirometry: Ordered today   PATIENT COUNSELING:    Sexuality: Discussed sexually transmitted diseases, partner selection, use of condoms, avoidance of unintended pregnancy  and contraceptive alternatives.   Advised to avoid cigarette smoking.  I discussed with the patient that most people either abstain from alcohol or drink within safe limits (<=14/week and <=4 drinks/occasion for males, <=7/weeks and <= 3 drinks/occasion for females) and that the risk for alcohol disorders and other health effects rises proportionally with the number of drinks per week and how often a drinker exceeds daily limits.  Discussed cessation/primary prevention of drug use and availability of treatment for abuse.   Diet: Encouraged to adjust caloric intake to maintain  or achieve ideal body weight, to reduce intake of dietary saturated fat and total fat, to limit sodium intake by avoiding high sodium foods and not adding table salt, and to maintain adequate dietary potassium and calcium preferably from fresh fruits, vegetables, and low-fat dairy products.    stressed the importance of regular exercise  Injury prevention: Discussed safety belts, safety helmets, smoke detector, smoking near bedding or upholstery.   Dental health: Discussed importance of regular tooth brushing, flossing, and dental visits.   Follow up plan: NEXT PREVENTATIVE PHYSICAL DUE IN 1 YEAR. Return in about 6 months (around 12/17/2019) for HTN/HLD, COPD.

## 2019-06-19 NOTE — Assessment & Plan Note (Signed)
I have recommended complete cessation of tobacco use. I have discussed various options available for assistance with tobacco cessation including over the counter methods (Nicotine gum, patch and lozenges). We also discussed prescription options (Chantix, Nicotine Inhaler / Nasal Spray). The patient is not interested in pursuing any prescription tobacco cessation options at this time. Recommended he ensure he schedule yearly CT.

## 2019-06-19 NOTE — Assessment & Plan Note (Signed)
Chronic, stable without current maintenance medications.  Spirometry done today noting FEV1 71% and FEV1/FVC 70% pre.  Continue to recommend complete smoking cessation.  Will order Albuterol inhaler to use as needed, discussed with patient and he agrees with POC.   If frequent use consider addition of maintenance inhaler.  Return in 6 months.

## 2019-06-19 NOTE — Assessment & Plan Note (Signed)
Chronic, stable with BP at goal today.  Recommend he start checking BP at home three mornings a week and documenting for provider.  Discussed goal BP with him.  Continue current medication regimen.  Return in 6 months. Refills sent.

## 2019-06-19 NOTE — Assessment & Plan Note (Signed)
A voluntary discussion about advance care planning including the explanation and discussion of advance directives was extensively discussed  with the patient for 15 minutes with patient.  Explanation about the health care proxy and Living will was reviewed and packet with forms with explanation of how to fill them out was given.  During this discussion, the patient was able to identify a health care proxy as his daughter and plans to fill out the paperwork required.  Patient was offered a separate [place] visit for further assistance with forms.

## 2019-06-20 ENCOUNTER — Other Ambulatory Visit: Payer: Self-pay | Admitting: Nurse Practitioner

## 2019-06-20 ENCOUNTER — Telehealth: Payer: Self-pay | Admitting: *Deleted

## 2019-06-20 DIAGNOSIS — D72820 Lymphocytosis (symptomatic): Secondary | ICD-10-CM

## 2019-06-20 LAB — COMPREHENSIVE METABOLIC PANEL
ALT: 18 IU/L (ref 0–44)
AST: 20 IU/L (ref 0–40)
Albumin/Globulin Ratio: 1.8 (ref 1.2–2.2)
Albumin: 4.6 g/dL (ref 3.7–4.7)
Alkaline Phosphatase: 108 IU/L (ref 39–117)
BUN/Creatinine Ratio: 18 (ref 10–24)
BUN: 19 mg/dL (ref 8–27)
Bilirubin Total: 0.3 mg/dL (ref 0.0–1.2)
CO2: 27 mmol/L (ref 20–29)
Calcium: 10 mg/dL (ref 8.6–10.2)
Chloride: 97 mmol/L (ref 96–106)
Creatinine, Ser: 1.04 mg/dL (ref 0.76–1.27)
GFR calc Af Amer: 83 mL/min/{1.73_m2} (ref >59)
GFR calc non Af Amer: 72 mL/min/{1.73_m2} (ref >59)
Globulin, Total: 2.6 g/dL (ref 1.5–4.5)
Glucose: 86 mg/dL (ref 65–99)
Potassium: 4.1 mmol/L (ref 3.5–5.2)
Sodium: 139 mmol/L (ref 134–144)
Total Protein: 7.2 g/dL (ref 6.0–8.5)

## 2019-06-20 LAB — LIPID PANEL W/O CHOL/HDL RATIO
Cholesterol, Total: 129 mg/dL (ref 100–199)
HDL: 31 mg/dL — ABNORMAL LOW (ref >39)
LDL Chol Calc (NIH): 51 mg/dL (ref 0–99)
Triglycerides: 304 mg/dL — ABNORMAL HIGH (ref 0–149)
VLDL Cholesterol Cal: 47 mg/dL — ABNORMAL HIGH (ref 5–40)

## 2019-06-20 LAB — CBC WITH DIFFERENTIAL/PLATELET
Basophils Absolute: 0.1 10*3/uL (ref 0.0–0.2)
Basos: 1 %
EOS (ABSOLUTE): 0.2 10*3/uL (ref 0.0–0.4)
Eos: 1 %
Hematocrit: 42.4 % (ref 37.5–51.0)
Hemoglobin: 14.9 g/dL (ref 13.0–17.7)
Immature Grans (Abs): 0 10*3/uL (ref 0.0–0.1)
Immature Granulocytes: 0 %
Lymphocytes Absolute: 3.5 10*3/uL — ABNORMAL HIGH (ref 0.7–3.1)
Lymphs: 32 %
MCH: 30.2 pg (ref 26.6–33.0)
MCHC: 35.1 g/dL (ref 31.5–35.7)
MCV: 86 fL (ref 79–97)
Monocytes Absolute: 0.8 10*3/uL (ref 0.1–0.9)
Monocytes: 7 %
Neutrophils Absolute: 6.6 10*3/uL (ref 1.4–7.0)
Neutrophils: 59 %
Platelets: 235 10*3/uL (ref 150–450)
RBC: 4.93 x10E6/uL (ref 4.14–5.80)
RDW: 12.2 % (ref 11.6–15.4)
WBC: 11.1 10*3/uL — ABNORMAL HIGH (ref 3.4–10.8)

## 2019-06-20 LAB — TSH: TSH: 2.04 u[IU]/mL (ref 0.450–4.500)

## 2019-06-20 LAB — PSA: Prostate Specific Ag, Serum: 4.7 ng/mL — ABNORMAL HIGH (ref 0.0–4.0)

## 2019-06-20 NOTE — Progress Notes (Signed)
Spoke to patient via telephone about lab results.  Informed him WBC and lymps slightly elevated, he reports he was a little sick last week and thought it was "just a sinus infection" and it went away.  Not sick at this time.  Will recheck this outpatient in 4 weeks.  PSA 4.7, for his age/ethnicity normal range 0 to 5.2.  Suspect some BPH present with aging.  Discussed with him.  Kidney, liver, and thyroid testing normal.  LDL and total cholesterol in goal range.  Triglycerides slightly elevated, he had ate a little prior to labs.  All questions and concerns answered.

## 2019-06-20 NOTE — Telephone Encounter (Signed)
Left message for patient to notify them that it is time to schedule annual low dose lung cancer screening CT scan. Instructed patient to call back to verify information prior to the scan being scheduled.  

## 2019-07-01 ENCOUNTER — Telehealth: Payer: Self-pay | Admitting: *Deleted

## 2019-07-01 DIAGNOSIS — Z87891 Personal history of nicotine dependence: Secondary | ICD-10-CM

## 2019-07-01 NOTE — Telephone Encounter (Signed)
Patient has been notified that annual lung cancer screening low dose CT scan is due currently or will be in near future. Confirmed that patient is within the age range of 55-77, and asymptomatic, (no signs or symptoms of lung cancer). Patient denies illness that would prevent curative treatment for lung cancer if found. Verified smoking history, (current, 51 pack year). The shared decision making visit was done 09/19/17. Patient is agreeable for CT scan being scheduled.

## 2019-07-03 ENCOUNTER — Ambulatory Visit (INDEPENDENT_AMBULATORY_CARE_PROVIDER_SITE_OTHER): Payer: PPO

## 2019-07-03 VITALS — Ht 68.2 in | Wt 192.0 lb

## 2019-07-03 DIAGNOSIS — Z0000 Encounter for general adult medical examination without abnormal findings: Secondary | ICD-10-CM | POA: Diagnosis not present

## 2019-07-03 NOTE — Progress Notes (Signed)
Subjective:   Brandon Hart is a 74 y.o. male who presents for Medicare Annual/Subsequent preventive examination.  This visit is being conducted via phone call  - after an attmept to do on video chat - due to the COVID-19 pandemic. This patient has given me verbal consent via phone to conduct this visit, patient states they are participating from their home address. Some vital signs may be absent or patient reported.   Patient identification: identified by name, DOB, and current address.    Review of Systems:   Cardiac Risk Factors include: advanced age (>65men, >54 women);dyslipidemia;hypertension;male gender     Objective:    Vitals: Ht 5' 8.2" (1.732 m)   BMI 29.11 kg/m   Body mass index is 29.11 kg/m.  Advanced Directives 07/03/2019 07/14/2017 07/13/2016 04/11/2016 04/28/2015  Does Patient Have a Medical Advance Directive? No No No No No  Would patient like information on creating a medical advance directive? - No - Patient declined - No - Patient declined -    Tobacco Social History   Tobacco Use  Smoking Status Current Every Day Smoker  . Packs/day: 0.75  . Years: 50.00  . Pack years: 37.50  . Types: Cigarettes  Smokeless Tobacco Never Used     Ready to quit: No Counseling given: Yes   Clinical Intake:  Pre-visit preparation completed: Yes  Pain : No/denies pain     Nutritional Risks: None Diabetes: No  How often do you need to have someone help you when you read instructions, pamphlets, or other written materials from your doctor or pharmacy?: 1 - Never  Interpreter Needed?: No  Information entered by :: Levorn Oleski,LPN  Past Medical History:  Diagnosis Date  . Hypertension    Past Surgical History:  Procedure Laterality Date  . COLONOSCOPY WITH PROPOFOL N/A 04/11/2016   Procedure: COLONOSCOPY WITH PROPOFOL;  Surgeon: Jonathon Bellows, MD;  Location: [facility] ENDOSCOPY;  Service: Endoscopy;  Laterality: N/A;  . HERNIA REPAIR    . VASECTOMY       Family History  Problem Relation Age of Onset  . Hypertension Mother   . Breast cancer Mother   . Hypertension Father   . Colon cancer Father   . Hypertension Brother   . Hypertension Brother    Social History   Socioeconomic History  . Marital status: Single    Spouse name: Not on file  . Number of children: Not on file  . Years of education: Not on file  . Highest education level: Not on file  Occupational History  . Not on file  Tobacco Use  . Smoking status: Current Every Day Smoker    Packs/day: 0.75    Years: 50.00    Pack years: 37.50    Types: Cigarettes  . Smokeless tobacco: Never Used  Substance and Sexual Activity  . Alcohol use: No    Comment: pt states he has not drink anything in about a year  . Drug use: No  . Sexual activity: Yes  Other Topics Concern  . Not on file  Social History Narrative  . Not on file   Social Determinants of Health   Financial Resource Strain:   . Difficulty of Paying Living Expenses: Not on file  Food Insecurity:   . Worried About Charity fundraiser in the Last Year: Not on file  . Ran Out of Food in the Last Year: Not on file  Transportation Needs:   . Lack of Transportation (Medical): Not on file  .  Lack of Transportation (Non-Medical): Not on file  Physical Activity:   . Days of Exercise per Week: Not on file  . Minutes of Exercise per Session: Not on file  Stress:   . Feeling of Stress : Not on file  Social Connections:   . Frequency of Communication with Friends and Family: Not on file  . Frequency of Social Gatherings with Friends and Family: Not on file  . Attends Religious Services: Not on file  . Active Member of Clubs or Organizations: Not on file  . Attends Archivist Meetings: Not on file  . Marital Status: Not on file    Outpatient Encounter Medications as of 07/03/2019  Medication Sig  . albuterol (VENTOLIN HFA) 108 (90 Base) MCG/ACT inhaler Inhale 2 puffs into the lungs every 6 (six)  hours as needed for wheezing or shortness of breath.  Marland Kitchen amLODipine (NORVASC) 5 MG tablet Take 1 tablet (5 mg total) by mouth daily.  Marland Kitchen atorvastatin (LIPITOR) 20 MG tablet Take 1 tablet (20 mg total) by mouth daily.  Marland Kitchen lisinopril-hydrochlorothiazide (ZESTORETIC) 20-25 MG tablet Take 1 tablet by mouth daily.   No facility-administered encounter medications on file as of 07/03/2019.    Activities of Daily Living In your present state of health, do you have any difficulty performing the following activities: 07/03/2019 06/19/2019  Hearing? N N  Vision? N N  Comment reading glasses. [facility] -  Difficulty concentrating or making decisions? N N  Walking or climbing stairs? N N  Dressing or bathing? N N  Doing errands, shopping? N N  Preparing Food and eating ? N -  Using the Toilet? N -  In the past six months, have you accidently leaked urine? N -  Do you have problems with loss of bowel control? N -  Managing your Medications? N -  Managing your Finances? N -  Housekeeping or managing your Housekeeping? N -  Some recent data might be hidden    Patient Care Team: Venita Lick, NP as PCP - General (Nurse Practitioner)   Assessment:   This is a routine wellness examination for [facility].  Exercise Activities and Dietary recommendations Current Exercise Habits: The patient does not participate in regular exercise at present, Exercise limited by: None identified  Goals    . Quit Smoking     Smoking cessation discussed    . Quit smoking / using tobacco       Fall Risk: Fall Risk  07/03/2019 06/19/2019 06/15/2018 07/14/2017 07/13/2016  Falls in the past year? 0 0 - No No  Number falls in past yr: 0 0 0 - -  Injury with Fall? 0 0 0 - -  Follow up - Falls evaluation completed Falls evaluation completed - -    FALL RISK PREVENTION PERTAINING TO THE HOME:  Any stairs in or around the home? No  If so, are there any without handrails? No   Home free of loose throw rugs in  walkways, pet beds, electrical cords, etc? Yes  Adequate lighting in your home to reduce risk of falls? Yes   ASSISTIVE DEVICES UTILIZED TO PREVENT FALLS:  Life alert? No  Use of a cane, walker or w/c? No  Grab bars in the bathroom? No  Shower chair or bench in shower? No  Elevated toilet seat or a handicapped toilet? No   TIMED UP AND GO:  Unable to perform   Depression Screen [facility] 2/9 Scores 06/19/2019 06/15/2018 07/14/2017 07/13/2016  PHQ -  2 Score 0 0 2 0  PHQ- 9 Score - 0 4 1    Cognitive Function     6CIT Screen 07/03/2019 07/14/2017  What Year? 0 points 0 points  What month? 0 points 0 points  What time? 0 points 0 points  Count back from 20 0 points 0 points  Months in reverse 0 points 0 points  Repeat phrase 0 points 2 points  Total Score 0 2     There is no immunization history on file for this patient.  Qualifies for Shingles Vaccine? Yes  Zostavax completed n/a. Due for Shingrix. Education has been provided regarding the importance of this vaccine. Pt has been advised to call insurance company to determine out of pocket expense. Advised may also receive vaccine at local pharmacy or Health Dept. Verbalized acceptance and understanding.  Tdap: declined   Flu Vaccine: declined  Pneumococcal Vaccine: declined    Screening Tests Health Maintenance  Topic Date Due  . INFLUENZA VACCINE  08/14/2019 (Originally 12/15/2018)  . TETANUS/TDAP  06/18/2020 (Originally 01/03/1965)  . PNA vac Low Risk Adult (1 of 2 - PCV13) 06/18/2020 (Originally 01/02/2013)  . COLONOSCOPY  04/11/2026  . Hepatitis C Screening  Completed   Cancer Screenings:  Colorectal Screening: Completed 2017 Repeat every 10 years  Lung Cancer Screening: (Low Dose CT Chest recommended if Age 12-80 years, 30 pack-year currently smoking OR have quit w/in 15years.) does qualify.   Scheduled 07/08/2019  Additional Screening:  Hepatitis C Screening: does qualify; Completed 2017  Vision Screening:  Recommended annual ophthalmology exams for early detection of glaucoma and other disorders of the eye. Is the patient up to date with their annual eye exam?  Yes  Who is the provider or what is the name of the office in which the pt attends annual eye exams? [facility]    Dental Screening: Recommended annual dental exams for proper oral hygiene  Community Resource Referral:  CRR required this visit?  No        Plan:  I have personally reviewed and addressed the Medicare Annual Wellness questionnaire and have noted the following in the patient's chart:  A. Medical and social history B. Use of alcohol, tobacco or illicit drugs  C. Current medications and supplements D. Functional ability and status E.  Nutritional status F.  Physical activity G. Advance directives H. List of other physicians I.  Hospitalizations, surgeries, and ER visits in previous 12 months J.  [employer] such as hearing and vision if needed, cognitive and depression L. Referrals and appointments   In addition, I have reviewed and discussed with patient certain preventive protocols, quality metrics, and best practice recommendations. A written personalized care plan for preventive services as well as general preventive health recommendations were provided to patient.   Signed,   Bevelyn Ngo, LPN  [id number] Nurse Health Advisor   Nurse Notes: none

## 2019-07-03 NOTE — Patient Instructions (Addendum)
Brandon Hart , Thank you for taking time to come for your Medicare Wellness Visit. I appreciate your ongoing commitment to your health goals. Please review the following plan we discussed and let me know if I can assist you in the future.   Screening recommendations/referrals: Colonoscopy: up to date Recommended yearly ophthalmology/optometry visit for glaucoma screening and checkup Recommended yearly dental visit for hygiene and checkup  Vaccinations: Influenza vaccine: declined Pneumococcal vaccine: declined Tdap vaccine: declined  Shingles vaccine: declined  Covid-19: declined   Advanced directives: Advance directive discussed with you today.  Once this is complete please bring a copy in to our office so we can scan it into your chart.  Conditions/risks identified: none   Next appointment: Follow up in one year for your annual wellness visit   Preventive Care 65 Years and Older, Male Preventive care refers to lifestyle choices and visits with your health care provider that can promote health and wellness. What does preventive care include?  A yearly physical exam. This is also called an annual well check.  Dental exams once or twice a year.  Routine eye exams. Ask your health care provider how often you should have your eyes checked.  Personal lifestyle choices, including:  Daily care of your teeth and gums.  Regular physical activity.  Eating a healthy diet.  Avoiding tobacco and drug use.  Limiting alcohol use.  Practicing safe sex.  Taking low doses of aspirin every day.  Taking vitamin and mineral supplements as recommended by your health care provider. What happens during an annual well check? The services and screenings done by your health care provider during your annual well check will depend on your age, overall health, lifestyle risk factors, and family history of disease. Counseling  Your health care provider may ask you questions about  your:  Alcohol use.  Tobacco use.  Drug use.  Emotional well-being.  Home and relationship well-being.  Sexual activity.  Eating habits.  History of falls.  Memory and ability to understand (cognition).  Work and work Statistician. Screening  You may have the following tests or measurements:  Height, weight, and BMI.  Blood pressure.  Lipid and cholesterol levels. These may be checked every 5 years, or more frequently if you are over 8 years old.  Skin check.  Lung cancer screening. You may have this screening every year starting at age 80 if you have a 30-pack-year history of smoking and currently smoke or have quit within the past 15 years.  Fecal occult blood test (FOBT) of the stool. You may have this test every year starting at age 7.  Flexible sigmoidoscopy or colonoscopy. You may have a sigmoidoscopy every 5 years or a colonoscopy every 10 years starting at age 67.  Prostate cancer screening. Recommendations will vary depending on your family history and other risks.  Hepatitis C blood test.  Hepatitis B blood test.  Sexually transmitted disease (STD) testing.  Diabetes screening. This is done by checking your blood sugar (glucose) after you have not eaten for a while (fasting). You may have this done every 1-3 years.  Abdominal aortic aneurysm (AAA) screening. You may need this if you are a current or former smoker.  Osteoporosis. You may be screened starting at age 73 if you are at high risk. Talk with your health care provider about your test results, treatment options, and if necessary, the need for more tests. Vaccines  Your health care provider may recommend certain vaccines, such as:  Influenza  vaccine. This is recommended every year.  Tetanus, diphtheria, and acellular pertussis (Tdap, Td) vaccine. You may need a Td booster every 10 years.  Zoster vaccine. You may need this after age 25.  Pneumococcal 13-valent conjugate (PCV13) vaccine.  One dose is recommended after age 76.  Pneumococcal polysaccharide (PPSV23) vaccine. One dose is recommended after age 43. Talk to your health care provider about which screenings and vaccines you need and how often you need them. This information is not intended to replace advice given to you by your health care provider. Make sure you discuss any questions you have with your health care provider. Document Released: 05/29/2015 Document Revised: 01/20/2016 Document Reviewed: 03/03/2015 Elsevier Interactive Patient Education  2017 [place] Prevention in the Home Falls can cause injuries. They can happen to people of all ages. There are many things you can do to make your home safe and to help prevent falls. What can I do on the outside of my home?  Regularly fix the edges of walkways and driveways and fix any cracks.  Remove anything that might make you trip as you walk through a door, such as a raised step or threshold.  Trim any bushes or trees on the path to your home.  Use bright outdoor lighting.  Clear any walking paths of anything that might make someone trip, such as rocks or tools.  Regularly check to see if handrails are loose or broken. Make sure that both sides of any steps have handrails.  Any raised decks and porches should have guardrails on the edges.  Have any leaves, snow, or ice cleared regularly.  Use sand or salt on walking paths during winter.  Clean up any spills in your garage right away. This includes oil or grease spills. What can I do in the bathroom?  Use night lights.  Install grab bars by the toilet and in the tub and shower. Do not use towel bars as grab bars.  Use non-skid mats or decals in the tub or shower.  If you need to sit down in the shower, use a plastic, non-slip stool.  Keep the floor dry. Clean up any water that spills on the floor as soon as it happens.  Remove soap buildup in the tub or shower regularly.  Attach bath  mats securely with double-sided non-slip rug tape.  Do not have throw rugs and other things on the floor that can make you trip. What can I do in the bedroom?  Use night lights.  Make sure that you have a light by your bed that is easy to reach.  Do not use any sheets or blankets that are too big for your bed. They should not hang down onto the floor.  Have a firm chair that has side arms. You can use this for support while you get dressed.  Do not have throw rugs and other things on the floor that can make you trip. What can I do in the kitchen?  Clean up any spills right away.  Avoid walking on wet floors.  Keep items that you use a lot in easy-to-reach places.  If you need to reach something above you, use a strong step stool that has a grab bar.  Keep electrical cords out of the way.  Do not use floor polish or wax that makes floors slippery. If you must use wax, use non-skid floor wax.  Do not have throw rugs and other things on the floor that can  make you trip. What can I do with my stairs?  Do not leave any items on the stairs.  Make sure that there are handrails on both sides of the stairs and use them. Fix handrails that are broken or loose. Make sure that handrails are as long as the stairways.  Check any carpeting to make sure that it is firmly attached to the stairs. Fix any carpet that is loose or worn.  Avoid having throw rugs at the top or bottom of the stairs. If you do have throw rugs, attach them to the floor with carpet tape.  Make sure that you have a light switch at the top of the stairs and the bottom of the stairs. If you do not have them, ask someone to add them for you. What else can I do to help prevent falls?  Wear shoes that:  Do not have high heels.  Have rubber bottoms.  Are comfortable and fit you well.  Are closed at the toe. Do not wear sandals.  If you use a stepladder:  Make sure that it is fully opened. Do not climb a closed  stepladder.  Make sure that both sides of the stepladder are locked into place.  Ask someone to hold it for you, if possible.  Clearly mark and make sure that you can see:  Any grab bars or handrails.  First and last steps.  Where the edge of each step is.  Use tools that help you move around (mobility aids) if they are needed. These include:  Canes.  Walkers.  Scooters.  Crutches.  Turn on the lights when you go into a dark area. Replace any light bulbs as soon as they burn out.  Set up your furniture so you have a clear path. Avoid moving your furniture around.  If any of your floors are uneven, fix them.  If there are any pets around you, be aware of where they are.  Review your medicines with your doctor. Some medicines can make you feel dizzy. This can increase your chance of falling. Ask your doctor what other things that you can do to help prevent falls. This information is not intended to replace advice given to you by your health care provider. Make sure you discuss any questions you have with your health care provider. Document Released: 02/26/2009 Document Revised: 10/08/2015 Document Reviewed: 06/06/2014 Elsevier Interactive Patient Education  2017 [employer].

## 2019-07-08 ENCOUNTER — Ambulatory Visit
Admission: RE | Admit: 2019-07-08 | Discharge: 2019-07-08 | Disposition: A | Discharge status: Home / Self Care | Payer: PPO | Source: Ambulatory Visit | Attending: Oncology | Admitting: Oncology

## 2019-07-08 ENCOUNTER — Other Ambulatory Visit: Payer: Self-pay

## 2019-07-08 DIAGNOSIS — F17210 Nicotine dependence, cigarettes, uncomplicated: Secondary | ICD-10-CM | POA: Diagnosis not present

## 2019-07-08 DIAGNOSIS — Z87891 Personal history of nicotine dependence: Secondary | ICD-10-CM | POA: Diagnosis not present

## 2019-07-11 ENCOUNTER — Encounter: Payer: Self-pay | Admitting: *Deleted

## 2019-07-11 ENCOUNTER — Encounter: Payer: Self-pay | Admitting: Nurse Practitioner

## 2019-07-11 DIAGNOSIS — I700 Atherosclerosis of aorta: Secondary | ICD-10-CM | POA: Insufficient documentation

## 2019-07-11 DIAGNOSIS — R911 Solitary pulmonary nodule: Secondary | ICD-10-CM | POA: Insufficient documentation

## 2019-07-15 ENCOUNTER — Telehealth: Payer: Self-pay

## 2019-07-15 NOTE — Telephone Encounter (Signed)
Spoke to patient on phone and reviewed lung ct readings.  He is aware of findings and educated on them.  Discussed benefit of smoking cessation.  He is aware of need for repeat CT in one year.

## 2019-07-15 NOTE — Telephone Encounter (Signed)
Copied from [place] [phone]. Topic: General - Other >> Jul 15, 2019 11:54 AM Rayann Heman wrote: Reason for CRM: pt called requesting results from ling screen. Not sure if we release those results. Pt would like a call back from the nurse.   Routing to provider for results.

## 2019-07-19 ENCOUNTER — Other Ambulatory Visit: Payer: PPO

## 2019-07-19 ENCOUNTER — Other Ambulatory Visit: Payer: Self-pay

## 2019-07-19 DIAGNOSIS — D72820 Lymphocytosis (symptomatic): Secondary | ICD-10-CM | POA: Diagnosis not present

## 2019-07-20 LAB — CBC WITH DIFFERENTIAL/PLATELET
Basophils Absolute: 0.1 10*3/uL (ref 0.0–0.2)
Basos: 1 %
EOS (ABSOLUTE): 0.2 10*3/uL (ref 0.0–0.4)
Eos: 2 %
Hematocrit: 41 % (ref 37.5–51.0)
Hemoglobin: 13.7 g/dL (ref 13.0–17.7)
Immature Grans (Abs): 0 10*3/uL (ref 0.0–0.1)
Immature Granulocytes: 0 %
Lymphocytes Absolute: 2.9 10*3/uL (ref 0.7–3.1)
Lymphs: 30 %
MCH: 29.1 pg (ref 26.6–33.0)
MCHC: 33.4 g/dL (ref 31.5–35.7)
MCV: 87 fL (ref 79–97)
Monocytes Absolute: 0.7 10*3/uL (ref 0.1–0.9)
Monocytes: 8 %
Neutrophils Absolute: 5.7 10*3/uL (ref 1.4–7.0)
Neutrophils: 59 %
Platelets: 254 10*3/uL (ref 150–450)
RBC: 4.71 x10E6/uL (ref 4.14–5.80)
RDW: 12.6 % (ref 11.6–15.4)
WBC: 9.7 10*3/uL (ref 3.4–10.8)

## 2019-07-21 NOTE — Progress Notes (Signed)
Good morning, please let Brandon Hart know his CBC shows improvement in white blood cell count and lymphocytes back to normal.  Good news.  Have a great day!!

## 2019-07-26 ENCOUNTER — Encounter: Payer: Self-pay | Admitting: Nurse Practitioner

## 2019-10-22 ENCOUNTER — Ambulatory Visit: Payer: Self-pay

## 2019-10-22 NOTE — Telephone Encounter (Signed)
Returned pt's call. Pt wanting to know what OTC allergy meds can he take that will raise his BP or interfere with his BP meds.  Pt informed that he can take OTC Allegra, Claritin, Xyzal and Zyrtec. Advised pt to stay away from any med that contains the letter D beside the name of the med. Advised that the decongestant can raise his BP. Pt verbalized understanding.       Reason for Disposition . Caller has medication question only, adult not sick, and triager answers question  Answer Assessment - Initial Assessment Questions 1.   NAME of MEDICATION: "What medicine are you calling about?"     Med for allergies that will not interfere with BP or BP med 2.   QUESTION: "What is your question?"     See above  Protocols used: MEDICATION QUESTION CALL-A-AH

## 2019-10-22 NOTE — Telephone Encounter (Signed)
pt would like to know what allergy medicine he can take that would not interfere with his BP medicine.   Attempted to call patient- left message to call back.

## 2019-12-17 ENCOUNTER — Ambulatory Visit: Payer: PPO | Admitting: Nurse Practitioner

## 2019-12-24 ENCOUNTER — Other Ambulatory Visit: Payer: Self-pay

## 2019-12-24 ENCOUNTER — Encounter: Payer: Self-pay | Admitting: Nurse Practitioner

## 2019-12-24 ENCOUNTER — Ambulatory Visit (INDEPENDENT_AMBULATORY_CARE_PROVIDER_SITE_OTHER): Payer: PPO | Admitting: Nurse Practitioner

## 2019-12-24 VITALS — BP 111/76 | HR 93 | Temp 98.8°F

## 2019-12-24 DIAGNOSIS — I700 Atherosclerosis of aorta: Secondary | ICD-10-CM | POA: Diagnosis not present

## 2019-12-24 DIAGNOSIS — J432 Centrilobular emphysema: Secondary | ICD-10-CM | POA: Diagnosis not present

## 2019-12-24 DIAGNOSIS — E781 Pure hyperglyceridemia: Secondary | ICD-10-CM

## 2019-12-24 DIAGNOSIS — I10 Essential (primary) hypertension: Secondary | ICD-10-CM | POA: Diagnosis not present

## 2019-12-24 DIAGNOSIS — F17210 Nicotine dependence, cigarettes, uncomplicated: Secondary | ICD-10-CM

## 2019-12-24 DIAGNOSIS — R911 Solitary pulmonary nodule: Secondary | ICD-10-CM | POA: Diagnosis not present

## 2019-12-24 MED ORDER — ALBUTEROL SULFATE HFA 108 (90 BASE) MCG/ACT IN AERS: 2.0000 | INHALATION_SPRAY | Freq: Four times a day (QID) | RESPIRATORY_TRACT | 4 refills | Status: DC | PRN

## 2019-12-24 NOTE — Assessment & Plan Note (Signed)
Chronic, ongoing.  Continue current medication regimen and adjust as needed.  Lipid panel today.  Return in 6 months. 

## 2019-12-24 NOTE — Assessment & Plan Note (Signed)
Chronic, stable with BP at goal today.  Recommend he start checking BP at home three mornings a week and documenting for provider.  Discussed goal BP with him.  Continue current medication regimen and adjust as needed.  Return in 6 months.  BMP today.

## 2019-12-24 NOTE — Patient Instructions (Signed)
Chronic Obstructive Pulmonary Disease Chronic obstructive pulmonary disease (COPD) is a long-term (chronic) lung problem. When you have COPD, it is hard for air to get in and out of your lungs. Usually the condition gets worse over time, and your lungs will never return to normal. There are things you can do to keep yourself as healthy as possible.  Your doctor may treat your condition with: ? Medicines. ? Oxygen. ? Lung surgery.  Your doctor may also recommend: ? Rehabilitation. This includes steps to make your body work better. It may involve a team of specialists. ? Quitting smoking, if you smoke. ? Exercise and changes to your diet. ? Comfort measures (palliative care). Follow these instructions at home: Medicines  Take over-the-counter and prescription medicines only as told by your doctor.  Talk to your doctor before taking any cough or allergy medicines. You may need to avoid medicines that cause your lungs to be dry. Lifestyle  If you smoke, stop. Smoking makes the problem worse. If you need help quitting, ask your doctor.  Avoid being around things that make your breathing worse. This may include smoke, chemicals, and fumes.  Stay active, but remember to rest as well.  Learn and use tips on how to relax.  Make sure you get enough sleep. Most adults need at least 7 hours of sleep every night.  Eat healthy foods. Eat smaller meals more often. Rest before meals. Controlled breathing Learn and use tips on how to control your breathing as told by your doctor. Try:  Breathing in (inhaling) through your nose for 1 second. Then, pucker your lips and breath out (exhale) through your lips for 2 seconds.  Putting one hand on your belly (abdomen). Breathe in slowly through your nose for 1 second. Your hand on your belly should move out. Pucker your lips and breathe out slowly through your lips. Your hand on your belly should move in as you breathe out.  Controlled coughing Learn  and use controlled coughing to clear mucus from your lungs. Follow these steps: 1. Lean your head a little forward. 2. Breathe in deeply. 3. Try to hold your breath for 3 seconds. 4. Keep your mouth slightly open while coughing 2 times. 5. Spit any mucus out into a tissue. 6. Rest and do the steps again 1 or 2 times as needed. General instructions  Make sure you get all the shots (vaccines) that your doctor recommends. Ask your doctor about a flu shot and a pneumonia shot.  Use oxygen therapy and pulmonary rehabilitation if told by your doctor. If you need home oxygen therapy, ask your doctor if you should buy a tool to measure your oxygen level (oximeter).  Make a COPD action plan with your doctor. This helps you to know what to do if you feel worse than usual.  Manage any other conditions you have as told by your doctor.  Avoid going outside when it is very hot, cold, or humid.  Avoid people who have a sickness you can catch (contagious).  Keep all follow-up visits as told by your doctor. This is important. Contact a doctor if:  You cough up more mucus than usual.  There is a change in the color or thickness of the mucus.  It is harder to breathe than usual.  Your breathing is faster than usual.  You have trouble sleeping.  You need to use your medicines more often than usual.  You have trouble doing your normal activities such as getting dressed   or walking around the house. Get help right away if:  You have shortness of breath while resting.  You have shortness of breath that stops you from: ? Being able to talk. ? Doing normal activities.  Your chest hurts for longer than 5 minutes.  Your skin color is more blue than usual.  Your pulse oximeter shows that you have low oxygen for longer than 5 minutes.  You have a fever.  You feel too tired to breathe normally. Summary  Chronic obstructive pulmonary disease (COPD) is a long-term lung problem.  The way your  lungs work will never return to normal. Usually the condition gets worse over time. There are things you can do to keep yourself as healthy as possible.  Take over-the-counter and prescription medicines only as told by your doctor.  If you smoke, stop. Smoking makes the problem worse. This information is not intended to replace advice given to you by your health care provider. Make sure you discuss any questions you have with your health care provider. Document Revised: 04/14/2017 Document Reviewed: 06/06/2016 Elsevier Patient Education  2020 [employer]  

## 2019-12-24 NOTE — Assessment & Plan Note (Signed)
Noted x 1 nodule 2 mm left upper lobe recent lung screening, non worrisome on report.  Continue annual lung screening and discussed with patient.  Recommend complete smoking cessation.

## 2019-12-24 NOTE — Progress Notes (Signed)
BP 111/76 (BP Location: Left Arm, Patient Position: Sitting, Cuff Size: Normal)   Pulse 93   Temp 98.8 F (37.1 C) (Oral)   SpO2 95%    Subjective:    Patient ID: Brandon Hart, male    DOB: [birth year], 74 y.o.   MRN: [id number]  HPI: Brandon Hart is a 74 y.o. male  Chief Complaint  Patient presents with  . Hypertension  . Hyperlipidemia  . COPD   HYPERTENSION / HYPERLIPIDEMIA Continues on Lipitor and Norvasc + Lisinopril-HCTZ.  Satisfied with current treatment?yes Duration of hypertension:chronic BP monitoring frequency:not checking BP range:not checking BP medication side effects:no Duration of hyperlipidemia:chronic Cholesterol medication side effects:no Cholesterol supplements: none Medication compliance:good compliance Aspirin:no Recent stressors:no Recurrent headaches:no Visual changes:no Palpitations:no Dyspnea:no Chest pain:no Lower extremity edema:no Dizzy/lightheaded:no  COPD Recent FEV1 71% and FEV1/FVC 70%.  Was provided Albuterol inhaler to use as needed at last visit, but never picked up due to $50 cost.  Currently no maintenance medication.  Continues to smoke 1/2pack per day, this is down from previous 1 PPD and encouraged him to continue reduction. He is not interested in medication to assist with quitting. Had lung CT screening in February 2021 -- with centrilobular and paraseptal emphysema noted + left upper lobe nodule 2 mm considered not worrisome, aortic atherosclerosis and CAD noted.   Does endorse occasional wheezing at night before bed and occasional cough in morning. COPD status:stable Satisfied with current treatment?:yes Oxygen use:no Dyspnea frequency:none Cough frequency:occasional per patient Rescue inhaler frequency:none Limitation of activity:no Productive cough: none Last Spirometry:today 06/19/2019 Pneumovax:Not up to Date Influenza: Not Up to Date   Relevant past  medical, surgical, family and social history reviewed and updated as indicated. Interim medical history since our last visit reviewed. Allergies and medications reviewed and updated.  Review of Systems  Constitutional: Negative for activity change, diaphoresis, fatigue and fever.  Respiratory: Positive for wheezing (intermittent). Negative for cough, chest tightness and shortness of breath.   Cardiovascular: Negative for chest pain, palpitations and leg swelling.  Gastrointestinal: Negative.   Neurological: Negative.   Psychiatric/Behavioral: Negative.     Per HPI unless specifically indicated above     Objective:    BP 111/76 (BP Location: Left Arm, Patient Position: Sitting, Cuff Size: Normal)   Pulse 93   Temp 98.8 F (37.1 C) (Oral)   SpO2 95%   Wt Readings from Last 3 Encounters:  07/08/19 192 lb (87.1 kg)  07/03/19 192 lb (87.1 kg)  06/19/19 192 lb 9.6 oz (87.4 kg)    Physical Exam Vitals and nursing note reviewed.  Constitutional:      General: He is awake. He is not in acute distress.    Appearance: He is well-developed and well-groomed. He is not ill-appearing.  HENT:     Head: Normocephalic and atraumatic.     Right Ear: Hearing normal. No drainage.     Left Ear: Hearing normal. No drainage.  Eyes:     General: Lids are normal.        Right eye: No discharge.        Left eye: No discharge.     Conjunctiva/sclera: Conjunctivae normal.     Pupils: Pupils are equal, round, and reactive to light.  Neck:     Thyroid: No thyromegaly.     Vascular: No carotid bruit.     Trachea: Trachea normal.  Cardiovascular:     Rate and Rhythm: Normal rate and regular rhythm.     Heart  sounds: Normal heart sounds, S1 normal and S2 normal. No murmur heard.  No gallop.   Pulmonary:     Effort: Pulmonary effort is normal. No accessory muscle usage or respiratory distress.     Breath sounds: Normal breath sounds.  Abdominal:     General: Bowel sounds are normal.      Palpations: Abdomen is soft.  Musculoskeletal:        General: Normal range of motion.     Cervical back: Normal range of motion and neck supple.     Right lower leg: No edema.     Left lower leg: No edema.  Skin:    General: Skin is warm and dry.     Capillary Refill: Capillary refill takes less than 2 seconds.     Findings: No rash.  Neurological:     Mental Status: He is alert and oriented to person, place, and time.     Deep Tendon Reflexes: Reflexes are normal and symmetric.  Psychiatric:        Attention and Perception: Attention normal.        Mood and Affect: Mood normal.        Speech: Speech normal.        Behavior: Behavior normal. Behavior is cooperative.        Thought Content: Thought content normal.     Results for orders placed or performed in visit on 07/19/19  CBC with Differential/Platelet  Result Value Ref Range   WBC 9.7 3.4 - 10.8 x10E3/uL   RBC 4.71 4.14 - 5.80 x10E6/uL   Hemoglobin 13.7 13.0 - 17.7 g/dL   Hematocrit 41.0 37.5 - 51.0 %   MCV 87 79 - 97 fL   MCH 29.1 26.6 - 33.0 pg   MCHC 33.4 31 - 35 g/dL   RDW 12.6 11.6 - 15.4 %   Platelets 254 150 - 450 x10E3/uL   Neutrophils 59 Not Estab. %   Lymphs 30 Not Estab. %   Monocytes 8 Not Estab. %   Eos 2 Not Estab. %   Basos 1 Not Estab. %   Neutrophils Absolute 5.7 1 - 7 x10E3/uL   Lymphocytes Absolute 2.9 0 - 3 x10E3/uL   Monocytes Absolute 0.7 0 - 0 x10E3/uL   EOS (ABSOLUTE) 0.2 0.0 - 0.4 x10E3/uL   Basophils Absolute 0.1 0 - 0 x10E3/uL   Immature Granulocytes 0 Not Estab. %   Immature Grans (Abs) 0.0 0.0 - 0.1 x10E3/uL      Assessment & Plan:   Problem List Items Addressed This Visit      Cardiovascular and Mediastinum   Hypertension    Chronic, stable with BP at goal today.  Recommend he start checking BP at home three mornings a week and documenting for provider.  Discussed goal BP with him.  Continue current medication regimen and adjust as needed.  Return in 6 months.  BMP today.       Relevant Orders   Basic metabolic panel   Aortic atherosclerosis ([place])    Noted on February 2021 CT screening, continue statin daily and recommend daily Baby ASA.  Recommend complete cessation smoking for prevention.        Respiratory   Centrilobular emphysema ([facility]) - Primary    Chronic, stable without current maintenance medications.  Spirometry recently noting FEV1 71% and FEV1/FVC 70% pre.  Continue to recommend complete smoking cessation.  Will order Proair inhaler to use as needed (appears to be $15 with insurance), discussed with patient  and he agrees with POC.   If frequent use consider addition of maintenance inhaler, consider Anoro which appears to be covered.  Continue annual lung screening.  Return in 6 months.      Relevant Medications   albuterol (PROAIR HFA) 108 (90 Base) MCG/ACT inhaler     Other   Nicotine dependence, cigarettes, uncomplicated    I have recommended complete cessation of tobacco use. I have discussed various options available for assistance with tobacco cessation including over the counter methods (Nicotine gum, patch and lozenges). We also discussed prescription options (Chantix, Nicotine Inhaler / Nasal Spray). The patient is not interested in pursuing any prescription tobacco cessation options at this time.       Hyperlipidemia    Chronic, ongoing.  Continue current medication regimen and adjust as needed.  Lipid panel today.   Return in 6 months.      Relevant Orders   Lipid Panel w/o Chol/HDL Ratio   Pulmonary nodule    Noted x 1 nodule 2 mm left upper lobe recent lung screening, non worrisome on report.  Continue annual lung screening and discussed with patient.  Recommend complete smoking cessation.          Follow up plan: Return in about 6 months (around 06/25/2020) for Annual physical.

## 2019-12-24 NOTE — Assessment & Plan Note (Signed)
Noted on February 2021 CT screening, continue statin daily and recommend daily Baby ASA.  Recommend complete cessation smoking for prevention.

## 2019-12-24 NOTE — Assessment & Plan Note (Signed)
I have recommended complete cessation of tobacco use. I have discussed various options available for assistance with tobacco cessation including over the counter methods (Nicotine gum, patch and lozenges). We also discussed prescription options (Chantix, Nicotine Inhaler / Nasal Spray). The patient is not interested in pursuing any prescription tobacco cessation options at this time.  

## 2019-12-24 NOTE — Assessment & Plan Note (Addendum)
Chronic, stable without current maintenance medications.  Spirometry recently noting FEV1 71% and FEV1/FVC 70% pre.  Continue to recommend complete smoking cessation.  Will order Proair inhaler to use as needed (appears to be $15 with insurance), discussed with patient and he agrees with POC.   If frequent use consider addition of maintenance inhaler, consider Anoro which appears to be covered.  Continue annual lung screening.  Return in 6 months.

## 2019-12-25 LAB — BASIC METABOLIC PANEL
BUN/Creatinine Ratio: 16 (ref 10–24)
BUN: 20 mg/dL (ref 8–27)
CO2: 26 mmol/L (ref 20–29)
Calcium: 9.3 mg/dL (ref 8.6–10.2)
Chloride: 98 mmol/L (ref 96–106)
Creatinine, Ser: 1.24 mg/dL (ref 0.76–1.27)
GFR calc Af Amer: 67 mL/min/{1.73_m2} (ref >59)
GFR calc non Af Amer: 58 mL/min/{1.73_m2} — ABNORMAL LOW (ref >59)
Glucose: 83 mg/dL (ref 65–99)
Potassium: 4.3 mmol/L (ref 3.5–5.2)
Sodium: 140 mmol/L (ref 134–144)

## 2019-12-25 LAB — LIPID PANEL W/O CHOL/HDL RATIO
Cholesterol, Total: 153 mg/dL (ref 100–199)
HDL: 28 mg/dL — ABNORMAL LOW (ref >39)
LDL Chol Calc (NIH): 68 mg/dL (ref 0–99)
Triglycerides: 362 mg/dL — ABNORMAL HIGH (ref 0–149)
VLDL Cholesterol Cal: 57 mg/dL — ABNORMAL HIGH (ref 5–40)

## 2019-12-25 NOTE — Progress Notes (Signed)
Please let Brandon Hart know his labs have returned.  Overall they continue to be stable with exception of continued elevation in triglycerides.  I would like to recheck cholesterol levels with him fasting at next visit please.  If any questions let me know.  Have a great day!!

## 2020-06-29 ENCOUNTER — Other Ambulatory Visit: Payer: Self-pay

## 2020-06-29 ENCOUNTER — Ambulatory Visit (INDEPENDENT_AMBULATORY_CARE_PROVIDER_SITE_OTHER): Payer: Medicare Other | Admitting: Nurse Practitioner

## 2020-06-29 ENCOUNTER — Encounter: Payer: Self-pay | Admitting: Nurse Practitioner

## 2020-06-29 VITALS — BP 122/85 | HR 85 | Temp 98.2°F | Ht 68.11 in | Wt 199.0 lb

## 2020-06-29 DIAGNOSIS — Z125 Encounter for screening for malignant neoplasm of prostate: Secondary | ICD-10-CM | POA: Diagnosis not present

## 2020-06-29 DIAGNOSIS — Z0000 Encounter for general adult medical examination without abnormal findings: Secondary | ICD-10-CM

## 2020-06-29 DIAGNOSIS — I700 Atherosclerosis of aorta: Secondary | ICD-10-CM | POA: Diagnosis not present

## 2020-06-29 DIAGNOSIS — F17210 Nicotine dependence, cigarettes, uncomplicated: Secondary | ICD-10-CM

## 2020-06-29 DIAGNOSIS — R911 Solitary pulmonary nodule: Secondary | ICD-10-CM

## 2020-06-29 DIAGNOSIS — I10 Essential (primary) hypertension: Secondary | ICD-10-CM

## 2020-06-29 DIAGNOSIS — E781 Pure hyperglyceridemia: Secondary | ICD-10-CM | POA: Diagnosis not present

## 2020-06-29 DIAGNOSIS — J432 Centrilobular emphysema: Secondary | ICD-10-CM

## 2020-06-29 MED ORDER — LISINOPRIL-HYDROCHLOROTHIAZIDE 20-25 MG PO TABS: 1.0000 | ORAL_TABLET | Freq: Every day | ORAL | 4 refills | Status: DC

## 2020-06-29 MED ORDER — ATORVASTATIN CALCIUM 20 MG PO TABS: 20.0000 mg | ORAL_TABLET | Freq: Every day | ORAL | 4 refills | Status: DC

## 2020-06-29 MED ORDER — AMLODIPINE BESYLATE 5 MG PO TABS
5.0000 mg | ORAL_TABLET | Freq: Every day | ORAL | 4 refills | Status: DC
Start: 2020-06-29 — End: 2021-09-29

## 2020-06-29 NOTE — Assessment & Plan Note (Signed)
Noted x 1 nodule 2 mm left upper lobe recent lung screening, non worrisome on report.  Continue annual lung screening and discussed with patient.  Recommend complete smoking cessation. °

## 2020-06-29 NOTE — Assessment & Plan Note (Signed)
Chronic, stable with BP at goal today.  Recommend he start checking BP at home three mornings a week and documenting for provider.  Discussed goal BP with him.  Continue current medication regimen and adjust as needed.  Return in 6 months.  CMP and TSH today.  Refills sent in.

## 2020-06-29 NOTE — Assessment & Plan Note (Signed)
I have recommended complete cessation of tobacco use. I have discussed various options available for assistance with tobacco cessation including over the counter methods (Nicotine gum, patch and lozenges). We also discussed prescription options (Chantix, Nicotine Inhaler / Nasal Spray). The patient is not interested in pursuing any prescription tobacco cessation options at this time.  

## 2020-06-29 NOTE — Assessment & Plan Note (Signed)
Chronic, stable without current maintenance medications.  Spirometry in 2021 noting FEV1 71% and FEV1/FVC 70% pre.  Continue to recommend complete smoking cessation.  Continue Albuterol as needed, has not used.   If frequent use consider addition of maintenance inhaler, consider Anoro which appears to be covered.  Continue annual lung screening.  Recommend complete cessation of smoking.  Spirometry next visit.  Return in 6 months.

## 2020-06-29 NOTE — Progress Notes (Signed)
BP 122/85   Pulse 85   Temp 98.2 F (36.8 C) (Oral)   Ht 5' 8.11" (1.73 m)   Wt 199 lb (90.3 kg)   SpO2 97%   BMI 30.16 kg/m    Subjective:    Patient ID: Brandon Hart, male    DOB: October 20, 1945, 75 y.o.   MRN: [id number]  HPI: Brandon Hart is a 75 y.o. male presenting on 06/29/2020 for comprehensive medical examination. Current medical complaints include:none  He currently lives with: self Interim Problems from his last visit: no   HYPERTENSION / HYPERLIPIDEMIA Continues on Lipitor and Norvasc + Lisinopril-HCTZ.   Satisfied with current treatment? yes Duration of hypertension: chronic BP monitoring frequency: not checking BP range: not checking BP medication side effects: no Duration of hyperlipidemia: chronic Cholesterol medication side effects: no Cholesterol supplements: none Medication compliance: good compliance Aspirin: no Recent stressors: no Recurrent headaches: no Visual changes: no Palpitations: no Dyspnea: no Chest pain: no Lower extremity edema: no Dizzy/lightheaded: no   COPD Continues to smoke 1/2 pack per day, this is down from previous 1 PPD and encouraged him to continue reduction.  He is not interested in medication to assist with quitting. Had lung CT screening last 07/08/19 continuing to note centrilobular and paraseptal emphysema + aortic atherosclerosis.  Currently only uses Albuterol --- has not had to use yet. COPD status: stable Satisfied with current treatment?: yes Oxygen use: no Dyspnea frequency: none Cough frequency: occasional per patient Rescue inhaler frequency:  none Limitation of activity: no Productive cough: none Last Spirometry: today 06/19/2019 == FEV1 70% and FEV1/FVC 98% Pneumovax: Not up to Date Influenza: Not Up to Date   Functional Status Survey: Is the patient deaf or have difficulty hearing?: No Does the patient have difficulty seeing, even when wearing glasses/contacts?: No Does the patient  have difficulty concentrating, remembering, or making decisions?: No Does the patient have difficulty walking or climbing stairs?: No Does the patient have difficulty dressing or bathing?: No Does the patient have difficulty doing errands alone such as visiting a doctor's office or shopping?: No  FALL RISK: Fall Risk  06/29/2020 07/03/2019 06/19/2019 06/15/2018 07/14/2017  Falls in the past year? 0 0 0 - No  Number falls in past yr: - 0 0 0 -  Injury with Fall? 0 0 0 0 -  Follow up - - Falls evaluation completed Falls evaluation completed -    Depression Screen Depression screen [facility] 2/9 06/29/2020 06/19/2019 06/15/2018 07/14/2017 07/13/2016  Decreased Interest 0 0 0 1 0  Down, Depressed, Hopeless 0 0 0 1 0  PHQ - 2 Score 0 0 0 2 0  Altered sleeping 0 - 0 1 0  Tired, decreased energy 0 - 0 1 1  Change in appetite 0 - 0 0 0  Feeling bad or failure about yourself  0 - 0 0 0  Trouble concentrating 0 - 0 0 0  Moving slowly or fidgety/restless 0 - 0 0 0  Suicidal thoughts 0 - 0 0 0  PHQ-9 Score 0 - 0 4 1  Difficult doing work/chores - - Not difficult at all Not difficult at all -    Advanced Directives A voluntary discussion about advance care planning including the explanation and discussion of advance directives was extensively discussed  with the patient for 15 minutes with patient.  Explanation about the health care proxy and Living will was reviewed and packet with forms with explanation of how to fill them out was given.  During this discussion, the patient was able to identify a health care proxy as his daughter and plans to fill out the paperwork required.  Patient was offered a separate [place] visit for further assistance with forms.     Past Medical History:  Past Medical History:  Diagnosis Date  . Hypertension     Surgical History:  Past Surgical History:  Procedure Laterality Date  . COLONOSCOPY WITH PROPOFOL N/A 04/11/2016   Procedure: COLONOSCOPY WITH PROPOFOL;   Surgeon: Jonathon Bellows, MD;  Location: [facility] ENDOSCOPY;  Service: Endoscopy;  Laterality: N/A;  . HERNIA REPAIR    . VASECTOMY      Medications:  Current Outpatient Medications on File Prior to Visit  Medication Sig  . albuterol (PROAIR HFA) 108 (90 Base) MCG/ACT inhaler Inhale 2 puffs into the lungs every 6 (six) hours as needed for wheezing or shortness of breath.   No current facility-administered medications on file prior to visit.    Allergies:  No Known Allergies  Social History:  Social History   Socioeconomic History  . Marital status: Single    Spouse name: Not on file  . Number of children: Not on file  . Years of education: Not on file  . Highest education level: Not on file  Occupational History  . Not on file  Tobacco Use  . Smoking status: Current Every Day Smoker    Packs/day: 0.75    Years: 50.00    Pack years: 37.50    Types: Cigarettes  . Smokeless tobacco: Never Used  Vaping Use  . Vaping Use: Never used  Substance and Sexual Activity  . Alcohol use: No    Comment: pt states he has not drink anything in about a year  . Drug use: No  . Sexual activity: Yes  Other Topics Concern  . Not on file  Social History Narrative  . Not on file   Social Determinants of Health   Financial Resource Strain: Not on file  Food Insecurity: Not on file  Transportation Needs: Not on file  Physical Activity: Not on file  Stress: Not on file  Social Connections: Not on file  Intimate Partner Violence: Not on file   Social History   Tobacco Use  Smoking Status Current Every Day Smoker  . Packs/day: 0.75  . Years: 50.00  . Pack years: 37.50  . Types: Cigarettes  Smokeless Tobacco Never Used   Social History   Substance and Sexual Activity  Alcohol Use No   Comment: pt states he has not drink anything in about a year    Family History:  Family History  Problem Relation Age of Onset  . Hypertension Mother   . Breast cancer Mother   . Hypertension  Father   . Colon cancer Father   . Hypertension Brother   . Hypertension Brother     Past medical history, surgical history, medications, allergies, family history and social history reviewed with patient today and changes made to appropriate areas of the chart.   Review of Systems - negative All other ROS negative except what is listed above and in the HPI.      Objective:    BP 122/85   Pulse 85   Temp 98.2 F (36.8 C) (Oral)   Ht 5' 8.11" (1.73 m)   Wt 199 lb (90.3 kg)   SpO2 97%   BMI 30.16 kg/m   Wt Readings from Last 3 Encounters:  06/29/20 199 lb (90.3 kg)  07/08/19  192 lb (87.1 kg)  07/03/19 192 lb (87.1 kg)    Physical Exam Vitals and nursing note reviewed.  Constitutional:      General: He is awake. He is not in acute distress.    Appearance: He is well-developed. He is not ill-appearing.  HENT:     Head: Normocephalic and atraumatic.     Right Ear: Hearing, tympanic membrane, ear canal and external ear normal. No drainage.     Left Ear: Hearing, tympanic membrane, ear canal and external ear normal. No drainage.     Nose: Nose normal.     Mouth/Throat:     Pharynx: Uvula midline.  Eyes:     General: Lids are normal.        Right eye: No discharge.        Left eye: No discharge.     Extraocular Movements: Extraocular movements intact.     Conjunctiva/sclera: Conjunctivae normal.     Pupils: Pupils are equal, round, and reactive to light.     Visual Fields: Right eye visual fields normal and left eye visual fields normal.  Neck:     Thyroid: No thyromegaly.     Vascular: No carotid bruit or JVD.     Trachea: Trachea normal.  Cardiovascular:     Rate and Rhythm: Normal rate and regular rhythm.     Heart sounds: Normal heart sounds, S1 normal and S2 normal. No murmur heard. No gallop.   Pulmonary:     Effort: Pulmonary effort is normal. No accessory muscle usage or respiratory distress.     Breath sounds: Normal breath sounds.     Comments:  Intermittent expiratory wheezes noted throughout.   Abdominal:     General: Bowel sounds are normal.     Palpations: Abdomen is soft. There is no hepatomegaly or splenomegaly.     Tenderness: There is no abdominal tenderness.  Genitourinary:    Comments: Deferred per patient request. Musculoskeletal:        General: Normal range of motion.     Cervical back: Normal range of motion and neck supple.     Right lower leg: No edema.     Left lower leg: No edema.  Lymphadenopathy:     Head:     Right side of head: No submental, submandibular, tonsillar, preauricular or posterior auricular adenopathy.     Left side of head: No submental, submandibular, tonsillar, preauricular or posterior auricular adenopathy.     Cervical: No cervical adenopathy.  Skin:    General: Skin is warm and dry.     Capillary Refill: Capillary refill takes less than 2 seconds.     Findings: No rash.  Neurological:     Mental Status: He is alert and oriented to person, place, and time.     Cranial Nerves: Cranial nerves are intact.     Gait: Gait is intact.     Deep Tendon Reflexes: Reflexes are normal and symmetric.     Reflex Scores:      Brachioradialis reflexes are 2+ on the right side and 2+ on the left side.      Patellar reflexes are 2+ on the right side and 2+ on the left side. Psychiatric:        Attention and Perception: Attention normal.        Mood and Affect: Mood normal.        Speech: Speech normal.        Behavior: Behavior normal. Behavior is cooperative.  Thought Content: Thought content normal.        Cognition and Memory: Cognition normal.        Judgment: Judgment normal.     Results for orders placed or performed in visit on 12/24/19  Lipid Panel w/o Chol/HDL Ratio  Result Value Ref Range   Cholesterol, Total 153 100 - 199 mg/dL   Triglycerides 362 (H) 0 - 149 mg/dL   HDL 28 (L) >39 mg/dL   VLDL Cholesterol Cal 57 (H) 5 - 40 mg/dL   LDL Chol Calc (NIH) 68 0 - 99 mg/dL   Basic metabolic panel  Result Value Ref Range   Glucose 83 65 - 99 mg/dL   BUN 20 8 - 27 mg/dL   Creatinine, Ser 1.24 0.76 - 1.27 mg/dL   GFR calc non Af Amer 58 (L) >59 mL/min/1.73   GFR calc Af Amer 67 >59 mL/min/1.73   BUN/Creatinine Ratio 16 10 - 24   Sodium 140 134 - 144 mmol/L   Potassium 4.3 3.5 - 5.2 mmol/L   Chloride 98 96 - 106 mmol/L   CO2 26 20 - 29 mmol/L   Calcium 9.3 8.6 - 10.2 mg/dL      Assessment & Plan:   Problem List Items Addressed This Visit      Cardiovascular and Mediastinum   Hypertension    Chronic, stable with BP at goal today.  Recommend he start checking BP at home three mornings a week and documenting for provider.  Discussed goal BP with him.  Continue current medication regimen and adjust as needed.  Return in 6 months.  CMP and TSH today.  Refills sent in.      Relevant Medications   amLODipine (NORVASC) 5 MG tablet   atorvastatin (LIPITOR) 20 MG tablet   lisinopril-hydrochlorothiazide (ZESTORETIC) 20-25 MG tablet   Other Relevant Orders   Comprehensive metabolic panel   TSH   Aortic atherosclerosis ([place])    Noted on February 2021 CT screening, continue statin daily and recommend daily Baby ASA.  Recommend complete cessation smoking for prevention.      Relevant Medications   amLODipine (NORVASC) 5 MG tablet   atorvastatin (LIPITOR) 20 MG tablet   lisinopril-hydrochlorothiazide (ZESTORETIC) 20-25 MG tablet     Respiratory   Centrilobular emphysema (HCC) - Primary    Chronic, stable without current maintenance medications.  Spirometry in 2021 noting FEV1 71% and FEV1/FVC 70% pre.  Continue to recommend complete smoking cessation.  Continue Albuterol as needed, has not used.   If frequent use consider addition of maintenance inhaler, consider Anoro which appears to be covered.  Continue annual lung screening.  Recommend complete cessation of smoking.  Spirometry next visit.  Return in 6 months.      Relevant Orders   CBC with  Differential/Platelet     Other   Nicotine dependence, cigarettes, uncomplicated    I have recommended complete cessation of tobacco use. I have discussed various options available for assistance with tobacco cessation including over the counter methods (Nicotine gum, patch and lozenges). We also discussed prescription options (Chantix, Nicotine Inhaler / Nasal Spray). The patient is not interested in pursuing any prescription tobacco cessation options at this time.       Hyperlipidemia    Chronic, ongoing.  Continue current medication regimen and adjust as needed.  Lipid panel today.   Return in 6 months.      Relevant Medications   amLODipine (NORVASC) 5 MG tablet   atorvastatin (LIPITOR) 20 MG tablet  lisinopril-hydrochlorothiazide (ZESTORETIC) 20-25 MG tablet   Other Relevant Orders   Lipid Panel w/o Chol/HDL Ratio   Pulmonary nodule    Noted x 1 nodule 2 mm left upper lobe recent lung screening, non worrisome on report.  Continue annual lung screening and discussed with patient.  Recommend complete smoking cessation.       Other Visit Diagnoses    Prostate cancer screening       Wishes to continue screening and refuses prostate exam -- PSA today.   Relevant Orders   PSA   Annual physical exam       Annual labs today to include CBC, CMP, TSH, Lipid      Discussed aspirin prophylaxis for myocardial infarction prevention and decision was made to start ASA  LABORATORY TESTING:  Health maintenance labs ordered today as discussed above.   The natural history of prostate cancer and ongoing controversy regarding screening and potential treatment outcomes of prostate cancer has been discussed with the patient. The meaning of a false positive PSA and a false negative PSA has been discussed. He indicates understanding of the limitations of this screening test and wishes to proceed with screening PSA testing.  He is aware of guideline recommendations, stopping at age 59, but would like  to continue.   IMMUNIZATIONS:   - Tdap: Tetanus vaccination status reviewed: refuses. - Influenza: Refused - Pneumovax: Refused - Prevnar: Refused - Zostavax vaccine: Refused  SCREENING: - Colonoscopy: Up to date  Discussed with patient purpose of the colonoscopy is to detect colon cancer at curable precancerous or early stages   - AAA Screening: Refused  -Hearing Test: Not applicable  -Spirometry: Ordered today   PATIENT COUNSELING:    Sexuality: Discussed sexually transmitted diseases, partner selection, use of condoms, avoidance of unintended pregnancy  and contraceptive alternatives.   Advised to avoid cigarette smoking.  I discussed with the patient that most people either abstain from alcohol or drink within safe limits (<=14/week and <=4 drinks/occasion for males, <=7/weeks and <= 3 drinks/occasion for females) and that the risk for alcohol disorders and other health effects rises proportionally with the number of drinks per week and how often a drinker exceeds daily limits.  Discussed cessation/primary prevention of drug use and availability of treatment for abuse.   Diet: Encouraged to adjust caloric intake to maintain  or achieve ideal body weight, to reduce intake of dietary saturated fat and total fat, to limit sodium intake by avoiding high sodium foods and not adding table salt, and to maintain adequate dietary potassium and calcium preferably from fresh fruits, vegetables, and low-fat dairy products.    Stressed the importance of regular exercise  Injury prevention: Discussed safety belts, safety helmets, smoke detector, smoking near bedding or upholstery.   Dental health: Discussed importance of regular tooth brushing, flossing, and dental visits.   Follow up plan: NEXT PREVENTATIVE PHYSICAL DUE IN 1 YEAR. Return in about 6 months (around 12/27/2020) for HTN/HLD, COPD -- needs spirometry.

## 2020-06-29 NOTE — Patient Instructions (Signed)
Chronic Obstructive Pulmonary Disease  Chronic obstructive pulmonary disease (COPD) is a long-term (chronic) lung problem. When you have COPD, it is hard for air to get in and out of your lungs. Usually the condition gets worse over time, and your lungs will never return to normal. There are things you can do to keep yourself as healthy as possible. What are the causes?  Smoking. This is the most common cause.  Certain genes passed from parent to child (inherited). What increases the risk?  Being exposed to secondhand smoke from cigarettes, pipes, or cigars.  Being exposed to chemicals and other irritants, such as fumes and dust in the work environment.  Having chronic lung conditions or infections. What are the signs or symptoms?  Shortness of breath, especially during physical activity.  A long-term cough with a large amount of thick mucus. Sometimes, the cough may not have any mucus (dry cough).  Wheezing.  Breathing quickly.  Skin that looks gray or blue, especially in the fingers, toes, or lips.  Feeling tired (fatigue).  Weight loss.  Chest tightness.  Having infections often.  Episodes when breathing symptoms become much worse (exacerbations). At the later stages of this disease, you may have swelling in the ankles, feet, or legs. How is this treated?  Taking medicines.  Quitting smoking, if you smoke.  Rehabilitation. This includes steps to make your body work better. It may involve a team of specialists.  Doing exercises.  Making changes to your diet.  Using oxygen.  Lung surgery.  Lung transplant.  Comfort measures (palliative care). Follow these instructions at home: Medicines  Take over-the-counter and prescription medicines only as told by your doctor.  Talk to your doctor before taking any cough or allergy medicines. You may need to avoid medicines that cause your lungs to be dry. Lifestyle  If you smoke, stop smoking. Smoking makes the  problem worse.  Do not smoke or use any products that contain nicotine or tobacco. If you need help quitting, ask your doctor.  Avoid being around things that make your breathing worse. This may include smoke, chemicals, and fumes.  Stay active, but remember to rest as well.  Learn and use tips on how to manage stress and control your breathing.  Make sure you get enough sleep. Most adults need at least 7 hours of sleep every night.  Eat healthy foods. Eat smaller meals more often. Rest before meals. Controlled breathing Learn and use tips on how to control your breathing as told by your doctor. Try:  Breathing in (inhaling) through your nose for 1 second. Then, pucker your lips and breath out (exhale) through your lips for 2 seconds.  Putting one hand on your belly (abdomen). Breathe in slowly through your nose for 1 second. Your hand on your belly should move out. Pucker your lips and breathe out slowly through your lips. Your hand on your belly should move in as you breathe out.   Controlled coughing Learn and use controlled coughing to clear mucus from your lungs. Follow these steps: 1. Lean your head a little forward. 2. Breathe in deeply. 3. Try to hold your breath for 3 seconds. 4. Keep your mouth slightly open while coughing 2 times. 5. Spit any mucus out into a tissue. 6. Rest and do the steps again 1 or 2 times as needed. General instructions  Make sure you get all the shots (vaccines) that your doctor recommends. Ask your doctor about a flu shot and a pneumonia shot.    Use oxygen therapy and pulmonary rehabilitation if told by your doctor. If you need home oxygen therapy, ask your doctor if you should buy a tool to measure your oxygen level (oximeter).  Make a COPD action plan with your doctor. This helps you to know what to do if you feel worse than usual.  Manage any other conditions you have as told by your doctor.  Avoid going outside when it is very hot, cold, or  humid.  Avoid people who have a sickness you can catch (contagious).  Keep all follow-up visits. Contact a doctor if:  You cough up more mucus than usual.  There is a change in the color or thickness of the mucus.  It is harder to breathe than usual.  Your breathing is faster than usual.  You have trouble sleeping.  You need to use your medicines more often than usual.  You have trouble doing your normal activities such as getting dressed or walking around the house. Get help right away if:  You have shortness of breath while resting.  You have shortness of breath that stops you from: ? Being able to talk. ? Doing normal activities.  Your chest hurts for longer than 5 minutes.  Your skin color is more blue than usual.  Your pulse oximeter shows that you have low oxygen for longer than 5 minutes.  You have a fever.  You feel too tired to breathe normally. These symptoms may represent a serious problem that is an emergency. Do not wait to see if the symptoms will go away. Get medical help right away. Call your local emergency services (911 in the [place] Do not drive yourself to the hospital. Summary  Chronic obstructive pulmonary disease (COPD) is a long-term lung problem.  The way your lungs work will never return to normal. Usually the condition gets worse over time. There are things you can do to keep yourself as healthy as possible.  Take over-the-counter and prescription medicines only as told by your doctor.  If you smoke, stop. Smoking makes the problem worse. This information is not intended to replace advice given to you by your health care provider. Make sure you discuss any questions you have with your health care provider. Document Revised: 03/10/2020 Document Reviewed: 03/10/2020 Elsevier Patient Education  2021 [employer]   

## 2020-06-29 NOTE — Assessment & Plan Note (Signed)
Noted on February 2021 CT screening, continue statin daily and recommend daily Baby ASA.  Recommend complete cessation smoking for prevention.

## 2020-06-29 NOTE — Assessment & Plan Note (Signed)
Chronic, ongoing.  Continue current medication regimen and adjust as needed.  Lipid panel today.  Return in 6 months. 

## 2020-06-30 LAB — PSA: Prostate Specific Ag, Serum: 4.4 ng/mL — ABNORMAL HIGH (ref 0.0–4.0)

## 2020-06-30 LAB — COMPREHENSIVE METABOLIC PANEL
ALT: 21 IU/L (ref 0–44)
AST: 13 IU/L (ref 0–40)
Albumin/Globulin Ratio: 1.8 (ref 1.2–2.2)
Albumin: 4.6 g/dL (ref 3.7–4.7)
Alkaline Phosphatase: 108 IU/L (ref 44–121)
BUN/Creatinine Ratio: 16 (ref 10–24)
BUN: 16 mg/dL (ref 8–27)
Bilirubin Total: 0.4 mg/dL (ref 0.0–1.2)
CO2: 23 mmol/L (ref 20–29)
Calcium: 9.4 mg/dL (ref 8.6–10.2)
Chloride: 97 mmol/L (ref 96–106)
Creatinine, Ser: 1.02 mg/dL (ref 0.76–1.27)
GFR calc Af Amer: 84 mL/min/{1.73_m2} (ref >59)
GFR calc non Af Amer: 73 mL/min/{1.73_m2} (ref >59)
Globulin, Total: 2.5 g/dL (ref 1.5–4.5)
Glucose: 100 mg/dL — ABNORMAL HIGH (ref 65–99)
Potassium: 4 mmol/L (ref 3.5–5.2)
Sodium: 137 mmol/L (ref 134–144)
Total Protein: 7.1 g/dL (ref 6.0–8.5)

## 2020-06-30 LAB — CBC WITH DIFFERENTIAL/PLATELET
Basophils Absolute: 0.1 10*3/uL (ref 0.0–0.2)
Basos: 1 %
EOS (ABSOLUTE): 0.1 10*3/uL (ref 0.0–0.4)
Eos: 2 %
Hematocrit: 43.2 % (ref 37.5–51.0)
Hemoglobin: 14.5 g/dL (ref 13.0–17.7)
Immature Grans (Abs): 0 10*3/uL (ref 0.0–0.1)
Immature Granulocytes: 1 %
Lymphocytes Absolute: 2.8 10*3/uL (ref 0.7–3.1)
Lymphs: 32 %
MCH: 30.1 pg (ref 26.6–33.0)
MCHC: 33.6 g/dL (ref 31.5–35.7)
MCV: 90 fL (ref 79–97)
Monocytes Absolute: 0.7 10*3/uL (ref 0.1–0.9)
Monocytes: 8 %
Neutrophils Absolute: 5.1 10*3/uL (ref 1.4–7.0)
Neutrophils: 56 %
Platelets: 202 10*3/uL (ref 150–450)
RBC: 4.81 x10E6/uL (ref 4.14–5.80)
RDW: 12.7 % (ref 11.6–15.4)
WBC: 8.9 10*3/uL (ref 3.4–10.8)

## 2020-06-30 LAB — LIPID PANEL W/O CHOL/HDL RATIO
Cholesterol, Total: 140 mg/dL (ref 100–199)
HDL: 30 mg/dL — ABNORMAL LOW (ref >39)
LDL Chol Calc (NIH): 76 mg/dL (ref 0–99)
Triglycerides: 202 mg/dL — ABNORMAL HIGH (ref 0–149)
VLDL Cholesterol Cal: 34 mg/dL (ref 5–40)

## 2020-06-30 LAB — TSH: TSH: 2.36 u[IU]/mL (ref 0.450–4.500)

## 2020-06-30 NOTE — Progress Notes (Signed)
Please let Brandon Hart know his labs have returned and overall they remain stable.  No changes to medications needed.  His prostate blood work is trending down and remains in normal range for his age and ethnicity.  If any questions or concerns please let me know.  Have a great day!! Keep being awesome!!  Thank you for allowing me to participate in your care. Kindest regards, Qusay Villada

## 2020-07-03 ENCOUNTER — Other Ambulatory Visit: Payer: Self-pay | Admitting: *Deleted

## 2020-07-03 DIAGNOSIS — Z87891 Personal history of nicotine dependence: Secondary | ICD-10-CM

## 2020-07-03 DIAGNOSIS — F17200 Nicotine dependence, unspecified, uncomplicated: Secondary | ICD-10-CM

## 2020-07-03 DIAGNOSIS — Z122 Encounter for screening for malignant neoplasm of respiratory organs: Secondary | ICD-10-CM

## 2020-07-03 NOTE — Progress Notes (Signed)
Contacted and scheduled for annual lung screening scan. Patient is a current smoker with a 51.75 pack year history.

## 2020-07-06 ENCOUNTER — Ambulatory Visit (INDEPENDENT_AMBULATORY_CARE_PROVIDER_SITE_OTHER): Payer: Medicare Other

## 2020-07-06 VITALS — Ht 69.0 in | Wt 199.0 lb

## 2020-07-06 DIAGNOSIS — Z0000 Encounter for general adult medical examination without abnormal findings: Secondary | ICD-10-CM

## 2020-07-06 NOTE — Progress Notes (Signed)
I connected with Brandon Hart today by telephone and verified that I am speaking with the correct person using two identifiers. Location patient: home Location provider: work Persons participating in the virtual visit: Allah Reason, Glenna Durand LPN.   I discussed the limitations, risks, security and privacy concerns of performing an evaluation and management service by telephone and the availability of in person appointments. I also discussed with the patient that there may be a patient responsible charge related to this service. The patient expressed understanding and verbally consented to this telephonic visit.    Interactive audio and video telecommunications were attempted between this provider and patient, however failed, due to patient having technical difficulties OR patient did not have access to video capability.  We continued and completed visit with audio only.     Vital signs may be patient reported or missing.  Subjective:   Brandon Hart is a 75 y.o. male who presents for Medicare Annual/Subsequent preventive examination.  Review of Systems     Cardiac Risk Factors include: advanced age (>50men, >15 women);dyslipidemia;hypertension;male gender;sedentary lifestyle;smoking/ tobacco exposure     Objective:    Today's Vitals   07/06/20 1512  Weight: 199 lb (90.3 kg)  Height: 5\' 9"  (1.753 m)   Body mass index is 29.39 kg/m.  Advanced Directives 07/06/2020 07/03/2019 07/14/2017 07/13/2016 04/11/2016 04/28/2015  Does Patient Have a Medical Advance Directive? No No No No No No  Would patient like information on creating a medical advance directive? - - No - Patient declined - No - Patient declined -    Current Medications (verified) Outpatient Encounter Medications as of 07/06/2020  Medication Sig  . albuterol (PROAIR HFA) 108 (90 Base) MCG/ACT inhaler Inhale 2 puffs into the lungs every 6 (six) hours as needed for wheezing or shortness of breath.  Marland Kitchen  amLODipine (NORVASC) 5 MG tablet Take 1 tablet (5 mg total) by mouth daily.  Marland Kitchen atorvastatin (LIPITOR) 20 MG tablet Take 1 tablet (20 mg total) by mouth daily.  Marland Kitchen lisinopril-hydrochlorothiazide (ZESTORETIC) 20-25 MG tablet Take 1 tablet by mouth daily.   No facility-administered encounter medications on file as of 07/06/2020.    Allergies (verified) Patient has no known allergies.   History: Past Medical History:  Diagnosis Date  . Hypertension    Past Surgical History:  Procedure Laterality Date  . COLONOSCOPY WITH PROPOFOL N/A 04/11/2016   Procedure: COLONOSCOPY WITH PROPOFOL;  Surgeon: Jonathon Bellows, MD;  Location: [facility] ENDOSCOPY;  Service: Endoscopy;  Laterality: N/A;  . HERNIA REPAIR    . VASECTOMY     Family History  Problem Relation Age of Onset  . Hypertension Mother   . Breast cancer Mother   . Hypertension Father   . Colon cancer Father   . Hypertension Brother   . Hypertension Brother    Social History   Socioeconomic History  . Marital status: Single    Spouse name: Not on file  . Number of children: Not on file  . Years of education: Not on file  . Highest education level: Not on file  Occupational History  . Occupation: retired  Tobacco Use  . Smoking status: Current Every Day Smoker    Packs/day: 0.75    Years: 50.00    Pack years: 37.50    Types: Cigarettes  . Smokeless tobacco: Never Used  Vaping Use  . Vaping Use: Never used  Substance and Sexual Activity  . Alcohol use: No    Comment: pt states he has not drink anything  in about a year  . Drug use: No  . Sexual activity: Yes  Other Topics Concern  . Not on file  Social History Narrative  . Not on file   Social Determinants of Health   Financial Resource Strain: Low Risk   . Difficulty of Paying Living Expenses: Not hard at all  Food Insecurity: No Food Insecurity  . Worried About Charity fundraiser in the Last Year: Never true  . Ran Out of Food in the Last Year: Never true   Transportation Needs: No Transportation Needs  . Lack of Transportation (Medical): No  . Lack of Transportation (Non-Medical): No  Physical Activity: Inactive  . Days of Exercise per Week: 0 days  . Minutes of Exercise per Session: 0 min  Stress: No Stress Concern Present  . Feeling of Stress : Not at all  Social Connections: Not on file    Tobacco Counseling Ready to quit: Not Answered Counseling given: Not Answered   Clinical Intake:  Pre-visit preparation completed: Yes  Pain : No/denies pain     Nutritional Status: BMI 25 -29 Overweight Nutritional Risks: None Diabetes: No  How often do you need to have someone help you when you read instructions, pamphlets, or other written materials from your doctor or pharmacy?: 1 - Never What is the last grade level you completed in school?: GED  Diabetic? no  Interpreter Needed?: No  Information entered by :: NAllen LPN   Activities of Daily Living In your present state of health, do you have any difficulty performing the following activities: 07/06/2020 06/29/2020  Hearing? N N  Vision? N N  Difficulty concentrating or making decisions? N N  Walking or climbing stairs? N N  Dressing or bathing? N N  Doing errands, shopping? N N  Preparing Food and eating ? N -  Using the Toilet? N -  In the past six months, have you accidently leaked urine? N -  Do you have problems with loss of bowel control? N -  Managing your Medications? N -  Managing your Finances? N -  Housekeeping or managing your Housekeeping? N -  Some recent data might be hidden    Patient Care Team: Venita Lick, NP as PCP - General (Nurse Practitioner)  Indicate any recent Medical Services you may have received from other than [facility] providers in the past year (date may be approximate).     Assessment:   This is a routine wellness examination for [facility].  Hearing/Vision screen  Hearing Screening   125Hz  250Hz  500Hz  1000Hz  2000Hz  3000Hz  4000Hz   6000Hz  8000Hz   Right ear:           Left ear:           Vision Screening Comments: No regular eye [place]  Dietary issues and exercise activities discussed: Current Exercise Habits: The patient does not participate in regular exercise at present  Goals    . Patient Stated     07/06/2020, no goals    . Quit Smoking     Smoking cessation discussed    . Quit smoking / using tobacco      Depression Screen PHQ 2/9 Scores 07/06/2020 06/29/2020 06/19/2019 06/15/2018 07/14/2017 07/13/2016 04/28/2015  PHQ - 2 Score 0 0 0 0 2 0 0  PHQ- 9 Score - 0 - 0 4 1 -    Fall Risk Fall Risk  07/06/2020 06/29/2020 07/03/2019 06/19/2019 06/15/2018  Falls in the past year? 0 0 0 0 -  Number falls in past yr: - - 0 0 0  Injury with Fall? - 0 0 0 0  Risk for fall due to : Medication side effect - - - -  Follow up Falls evaluation completed;Education provided;Falls prevention discussed - - Falls evaluation completed Falls evaluation completed    FALL RISK PREVENTION PERTAINING TO THE HOME:  Any stairs in or around the home? Yes  If so, are there any without handrails? No  Home free of loose throw rugs in walkways, pet beds, electrical cords, etc? Yes  Adequate lighting in your home to reduce risk of falls? Yes   ASSISTIVE DEVICES UTILIZED TO PREVENT FALLS:  Life alert? No  Use of a cane, walker or w/c? No  Grab bars in the bathroom? No  Shower chair or bench in shower? No  Elevated toilet seat or a handicapped toilet? No   TIMED UP AND GO:  Was the test performed? No .  Cognitive Function:     6CIT Screen 07/06/2020 07/03/2019 07/14/2017  What Year? 0 points 0 points 0 points  What month? 0 points 0 points 0 points  What time? 0 points 0 points 0 points  Count back from 20 0 points 0 points 0 points  Months in reverse 4 points 0 points 0 points  Repeat phrase 4 points 0 points 2 points  Total Score 8 0 2    Immunizations  There is no immunization history on file for this  patient.  TDAP status: Due, Education has been provided regarding the importance of this vaccine. Advised may receive this vaccine at local pharmacy or Health Dept. Aware to provide a copy of the vaccination record if obtained from local pharmacy or Health Dept. Verbalized acceptance and understanding.  Flu Vaccine status: Declined, Education has been provided regarding the importance of this vaccine but patient still declined. Advised may receive this vaccine at local pharmacy or Health Dept. Aware to provide a copy of the vaccination record if obtained from local pharmacy or Health Dept. Verbalized acceptance and understanding.  Pneumococcal vaccine status: Declined,  Education has been provided regarding the importance of this vaccine but patient still declined. Advised may receive this vaccine at local pharmacy or Health Dept. Aware to provide a copy of the vaccination record if obtained from local pharmacy or Health Dept. Verbalized acceptance and understanding.   Covid-19 vaccine status: Completed vaccines  Qualifies for Shingles Vaccine? Yes   Zostavax completed No   Shingrix Completed?: No.    Education has been provided regarding the importance of this vaccine. Patient has been advised to call insurance company to determine out of pocket expense if they have not yet received this vaccine. Advised may also receive vaccine at local pharmacy or Health Dept. Verbalized acceptance and understanding.  Screening Tests Health Maintenance  Topic Date Due  . COVID-19 Vaccine (1) 07/15/2020 (Originally 01/02/1951)  . INFLUENZA VACCINE  08/13/2020 (Originally 12/15/2019)  . TETANUS/TDAP  06/29/2021 (Originally 01/03/1965)  . PNA vac Low Risk Adult (1 of 2 - PCV13) 06/29/2021 (Originally 01/02/2013)  . COLONOSCOPY (Pts 45-16yrs Insurance coverage will need to be confirmed)  04/11/2026  . Hepatitis C Screening  Completed    Health Maintenance  There are no preventive care reminders to display for  this patient.  Colorectal cancer screening: Type of screening: Colonoscopy. Completed 04/11/2016. Repeat every 10 years  Lung Cancer Screening: (Low Dose CT Chest recommended if Age 47-80 years, 30 pack-year currently smoking OR have quit w/in 15years.) does qualify.  Lung Cancer Screening Referral: appointment scheduled for 07/15/2020  Additional Screening:  Hepatitis C Screening: does qualify; Completed 08/10/2015  Vision Screening: Recommended annual ophthalmology exams for early detection of glaucoma and other disorders of the eye. Is the patient up to date with their annual eye exam?  No  Who is the provider or what is the name of the office in which the patient attends annual eye exams? [facility] If pt is not established with a provider, would they like to be referred to a provider to establish care? No .   Dental Screening: Recommended annual dental exams for proper oral hygiene  Community Resource Referral / Chronic Care Management: CRR required this visit?  No   CCM required this visit?  No      Plan:     I have personally reviewed and noted the following in the patient's chart:   . Medical and social history . Use of alcohol, tobacco or illicit drugs  . Current medications and supplements . Functional ability and status . Nutritional status . Physical activity . Advanced directives . List of other physicians . Hospitalizations, surgeries, and ER visits in previous 12 months . Vitals . Screenings to include cognitive, depression, and falls . Referrals and appointments  In addition, I have reviewed and discussed with patient certain preventive protocols, quality metrics, and best practice recommendations. A written personalized care plan for preventive services as well as general preventive health recommendations were provided to patient.     Kellie Simmering, LPN   9/75/[id number]   Nurse Notes:

## 2020-07-06 NOTE — Patient Instructions (Signed)
Brandon Hart , Thank you for taking time to come for your Medicare Wellness Visit. I appreciate your ongoing commitment to your health goals. Please review the following plan we discussed and let me know if I can assist you in the future.   Screening recommendations/referrals: Colonoscopy: completed 04/11/2016 Recommended yearly ophthalmology/optometry visit for glaucoma screening and checkup Recommended yearly dental visit for hygiene and checkup  Vaccinations: Influenza vaccine: decline Pneumococcal vaccine: decline Tdap vaccine: decline Shingles vaccine: decline   Covid-19:  decline  Advanced directives: Advance directive discussed with you today.   Conditions/risks identified: smoking  Next appointment: Follow up in one year for your annual wellness visit.   Preventive Care 73 Years and Older, Male Preventive care refers to lifestyle choices and visits with your health care provider that can promote health and wellness. What does preventive care include?  A yearly physical exam. This is also called an annual well check.  Dental exams once or twice a year.  Routine eye exams. Ask your health care provider how often you should have your eyes checked.  Personal lifestyle choices, including:  Daily care of your teeth and gums.  Regular physical activity.  Eating a healthy diet.  Avoiding tobacco and drug use.  Limiting alcohol use.  Practicing safe sex.  Taking low doses of aspirin every day.  Taking vitamin and mineral supplements as recommended by your health care provider. What happens during an annual well check? The services and screenings done by your health care provider during your annual well check will depend on your age, overall health, lifestyle risk factors, and family history of disease. Counseling  Your health care provider may ask you questions about your:  Alcohol use.  Tobacco use.  Drug use.  Emotional well-being.  Home and  relationship well-being.  Sexual activity.  Eating habits.  History of falls.  Memory and ability to understand (cognition).  Work and work Statistician. Screening  You may have the following tests or measurements:  Height, weight, and BMI.  Blood pressure.  Lipid and cholesterol levels. These may be checked every 5 years, or more frequently if you are over 3 years old.  Skin check.  Lung cancer screening. You may have this screening every year starting at age 57 if you have a 30-pack-year history of smoking and currently smoke or have quit within the past 15 years.  Fecal occult blood test (FOBT) of the stool. You may have this test every year starting at age 48.  Flexible sigmoidoscopy or colonoscopy. You may have a sigmoidoscopy every 5 years or a colonoscopy every 10 years starting at age 70.  Prostate cancer screening. Recommendations will vary depending on your family history and other risks.  Hepatitis C blood test.  Hepatitis B blood test.  Sexually transmitted disease (STD) testing.  Diabetes screening. This is done by checking your blood sugar (glucose) after you have not eaten for a while (fasting). You may have this done every 1-3 years.  Abdominal aortic aneurysm (AAA) screening. You may need this if you are a current or former smoker.  Osteoporosis. You may be screened starting at age 52 if you are at high risk. Talk with your health care provider about your test results, treatment options, and if necessary, the need for more tests. Vaccines  Your health care provider may recommend certain vaccines, such as:  Influenza vaccine. This is recommended every year.  Tetanus, diphtheria, and acellular pertussis (Tdap, Td) vaccine. You may need a Td booster every  10 years.  Zoster vaccine. You may need this after age 50.  Pneumococcal 13-valent conjugate (PCV13) vaccine. One dose is recommended after age 75.  Pneumococcal polysaccharide (PPSV23) vaccine. One  dose is recommended after age 72. Talk to your health care provider about which screenings and vaccines you need and how often you need them. This information is not intended to replace advice given to you by your health care provider. Make sure you discuss any questions you have with your health care provider. Document Released: 05/29/2015 Document Revised: 01/20/2016 Document Reviewed: 03/03/2015 Elsevier Interactive Patient Education  2017 [place] Prevention in the Home Falls can cause injuries. They can happen to people of all ages. There are many things you can do to make your home safe and to help prevent falls. What can I do on the outside of my home?  Regularly fix the edges of walkways and driveways and fix any cracks.  Remove anything that might make you trip as you walk through a door, such as a raised step or threshold.  Trim any bushes or trees on the path to your home.  Use bright outdoor lighting.  Clear any walking paths of anything that might make someone trip, such as rocks or tools.  Regularly check to see if handrails are loose or broken. Make sure that both sides of any steps have handrails.  Any raised decks and porches should have guardrails on the edges.  Have any leaves, snow, or ice cleared regularly.  Use sand or salt on walking paths during winter.  Clean up any spills in your garage right away. This includes oil or grease spills. What can I do in the bathroom?  Use night lights.  Install grab bars by the toilet and in the tub and shower. Do not use towel bars as grab bars.  Use non-skid mats or decals in the tub or shower.  If you need to sit down in the shower, use a plastic, non-slip stool.  Keep the floor dry. Clean up any water that spills on the floor as soon as it happens.  Remove soap buildup in the tub or shower regularly.  Attach bath mats securely with double-sided non-slip rug tape.  Do not have throw rugs and other  things on the floor that can make you trip. What can I do in the bedroom?  Use night lights.  Make sure that you have a light by your bed that is easy to reach.  Do not use any sheets or blankets that are too big for your bed. They should not hang down onto the floor.  Have a firm chair that has side arms. You can use this for support while you get dressed.  Do not have throw rugs and other things on the floor that can make you trip. What can I do in the kitchen?  Clean up any spills right away.  Avoid walking on wet floors.  Keep items that you use a lot in easy-to-reach places.  If you need to reach something above you, use a strong step stool that has a grab bar.  Keep electrical cords out of the way.  Do not use floor polish or wax that makes floors slippery. If you must use wax, use non-skid floor wax.  Do not have throw rugs and other things on the floor that can make you trip. What can I do with my stairs?  Do not leave any items on the stairs.  Make sure  that there are handrails on both sides of the stairs and use them. Fix handrails that are broken or loose. Make sure that handrails are as long as the stairways.  Check any carpeting to make sure that it is firmly attached to the stairs. Fix any carpet that is loose or worn.  Avoid having throw rugs at the top or bottom of the stairs. If you do have throw rugs, attach them to the floor with carpet tape.  Make sure that you have a light switch at the top of the stairs and the bottom of the stairs. If you do not have them, ask someone to add them for you. What else can I do to help prevent falls?  Wear shoes that:  Do not have high heels.  Have rubber bottoms.  Are comfortable and fit you well.  Are closed at the toe. Do not wear sandals.  If you use a stepladder:  Make sure that it is fully opened. Do not climb a closed stepladder.  Make sure that both sides of the stepladder are locked into place.  Ask  someone to hold it for you, if possible.  Clearly mark and make sure that you can see:  Any grab bars or handrails.  First and last steps.  Where the edge of each step is.  Use tools that help you move around (mobility aids) if they are needed. These include:  Canes.  Walkers.  Scooters.  Crutches.  Turn on the lights when you go into a dark area. Replace any light bulbs as soon as they burn out.  Set up your furniture so you have a clear path. Avoid moving your furniture around.  If any of your floors are uneven, fix them.  If there are any pets around you, be aware of where they are.  Review your medicines with your doctor. Some medicines can make you feel dizzy. This can increase your chance of falling. Ask your doctor what other things that you can do to help prevent falls. This information is not intended to replace advice given to you by your health care provider. Make sure you discuss any questions you have with your health care provider. Document Released: 02/26/2009 Document Revised: 10/08/2015 Document Reviewed: 06/06/2014 Elsevier Interactive Patient Education  2017 [employer].

## 2020-07-08 ENCOUNTER — Ambulatory Visit: Payer: Self-pay

## 2020-07-15 ENCOUNTER — Other Ambulatory Visit: Payer: Self-pay

## 2020-07-15 ENCOUNTER — Ambulatory Visit
Admission: RE | Admit: 2020-07-15 | Discharge: 2020-07-15 | Disposition: A | Discharge status: Home / Self Care | Payer: Medicare Other | Source: Ambulatory Visit | Attending: Oncology | Admitting: Oncology

## 2020-07-15 DIAGNOSIS — Z87891 Personal history of nicotine dependence: Secondary | ICD-10-CM | POA: Insufficient documentation

## 2020-07-15 DIAGNOSIS — Z122 Encounter for screening for malignant neoplasm of respiratory organs: Secondary | ICD-10-CM | POA: Diagnosis present

## 2020-07-15 DIAGNOSIS — F17200 Nicotine dependence, unspecified, uncomplicated: Secondary | ICD-10-CM | POA: Insufficient documentation

## 2020-07-23 ENCOUNTER — Encounter: Payer: Self-pay | Admitting: *Deleted

## 2020-12-28 ENCOUNTER — Ambulatory Visit (INDEPENDENT_AMBULATORY_CARE_PROVIDER_SITE_OTHER): Payer: Medicare Other | Admitting: Nurse Practitioner

## 2020-12-28 ENCOUNTER — Other Ambulatory Visit: Payer: Self-pay

## 2020-12-28 ENCOUNTER — Encounter: Payer: Self-pay | Admitting: Nurse Practitioner

## 2020-12-28 VITALS — BP 120/80 | HR 80 | Temp 98.7°F | Ht 68.62 in | Wt 200.5 lb

## 2020-12-28 DIAGNOSIS — F5101 Primary insomnia: Secondary | ICD-10-CM

## 2020-12-28 DIAGNOSIS — I10 Essential (primary) hypertension: Secondary | ICD-10-CM | POA: Diagnosis not present

## 2020-12-28 DIAGNOSIS — I700 Atherosclerosis of aorta: Secondary | ICD-10-CM | POA: Diagnosis not present

## 2020-12-28 DIAGNOSIS — E781 Pure hyperglyceridemia: Secondary | ICD-10-CM

## 2020-12-28 DIAGNOSIS — J432 Centrilobular emphysema: Secondary | ICD-10-CM

## 2020-12-28 DIAGNOSIS — L720 Epidermal cyst: Secondary | ICD-10-CM

## 2020-12-28 DIAGNOSIS — M720 Palmar fascial fibromatosis [Dupuytren]: Secondary | ICD-10-CM | POA: Diagnosis not present

## 2020-12-28 DIAGNOSIS — F17210 Nicotine dependence, cigarettes, uncomplicated: Secondary | ICD-10-CM

## 2020-12-28 NOTE — Assessment & Plan Note (Signed)
To right side of face near upper mandible.  Patient requests referral for removal, would prefer general surgery.  Will place referral.

## 2020-12-28 NOTE — Assessment & Plan Note (Signed)
Chronic, ongoing.  Continue current medication regimen and adjust as needed.  Lipid panel today.  Return in 6 months. 

## 2020-12-28 NOTE — Assessment & Plan Note (Signed)
Noted to right ring finger with decreased ROM present.  Referral to [facility] who has assisted him with this before.  Return if any worsening or ongoing.

## 2020-12-28 NOTE — Patient Instructions (Signed)
Dupuytren's Contracture Dupuytren's contracture is a condition in which tissue under the skin of the palm becomes thick. This causes one or more of the fingers to curl inward (contract) toward the palm. After a while, the fingers may not be able to straighten out. This condition affects some or all of the fingers and the palm of thehand. This condition may affect one or both hands. Dupuytren's contracture is a long-term (chronic) condition that develops (progresses) slowly over time. There is no cure, but symptoms can be managed and progression can be slowed with treatment. This condition is usually notdangerous or painful, but it can interfere with everyday tasks. What are the causes?  This condition is caused by tissue (fascia) in the palm that gets thicker and tighter. When the fascia thickens, it pulls on the cords of tissue (tendons) that control finger movement. This causes the fingers to contract. The cause of fascia thickening is not known. However, the condition is often passed along from parent to child (inherited). What increases the risk? The following factors may make you more likely to develop this condition: Being 62 years of age or older. Being male. Having a family history of this condition. Using tobacco products, including cigarettes, chewing tobacco, and e-cigarettes. Drinking alcohol excessively. Having diabetes. Having a seizure disorder. What are the signs or symptoms? Early symptoms of this condition may include: Thick, puckered skin on the hand. One or more lumps (nodules) on the palm. Nodules may be tender when they first appear, but they are generally painless. Later symptoms of this condition may include: Thick cords of tissue in the palm. Fingers curled up toward the palm. Inability to straighten the fingers into their normal position. Though this condition is usually painless, you may have discomfort when holdingor grabbing objects. How is this diagnosed? This  condition is diagnosed with a physical exam, which may include: Looking at your hands and feeling your palms. This is to check for thickened fascia and nodules. Measuring finger motion. Doing the [place] tabletop test. You may be asked to try to put your hand on a surface, with your palm down and your fingers straight out. How is this treated? There is no cure for this condition, but treatment can relieve discomfort and make symptoms more manageable. Treatment options may include: Physical therapy. This can strengthen your hand and increase flexibility. Occupational therapy. This can help you with everyday tasks that may be more difficult because of your condition. Shots (injections). Substances may be injected into your hand, such as: Medicines that help to decrease swelling (corticosteroids). Proteins (collagenase) to weaken thick tissue. After a collagenase injection, your health care provider may stretch your fingers. Needle aponeurotomy. A needle is pushed through the skin and into the fascia. Moving the needle against the fascia can weaken or break up the thick tissue. Surgery. This may be needed if your condition causes discomfort or interferes with everyday activities. Physical therapy is usually needed after surgery. No treatment is guaranteed to cure this condition. Recurrence of symptoms iscommon. Follow these instructions at home: Hand care Take these actions to help protect your hand from possible injury: Use tools that have padded grips. Wear protective gloves while you work with your hands. Avoid repetitive hand movements. General instructions Take over-the-counter and prescription medicines only as told by your health care provider. Manage any other conditions that you have, such as diabetes. If physical therapy was prescribed, do exercises as told by your health care provider. Do not use any products that  contain nicotine or tobacco, such as cigarettes, e-cigarettes, and  chewing tobacco. If you need help quitting, ask your health care provider. If you drink alcohol: Limit how much you use to: 0-1 drink a day for women. 0-2 drinks a day for men. Be aware of how much alcohol is in your drink. In the [place] one drink equals one 12 oz bottle of beer (355 mL), one 5 oz glass of wine (148 mL), or one 1 oz glass of hard liquor (44 mL). Keep all follow-up visits as told by your health care provider. This is important. Contact a health care provider if: You develop new symptoms, or your symptoms get worse. You have pain that gets worse or does not get better with medicine. You have difficulty or discomfort with everyday tasks. You develop numbness or tingling. Get help right away if: You have severe pain. Your fingers change color or become unusually cold. Summary Dupuytren's contracture is a condition in which tissue under the skin of the palm becomes thick. This condition is caused by tissue (fascia) that thickens. When it thickens, it pulls on the cords of tissue (tendons) that control finger movement and makes the fingers to contract. You are more likely to develop this condition if you are a man, are over 56 years of age, have a family history of the condition, and drink a lot of alcohol. This condition can be treated with physical and occupational therapy, injections, and surgery. Follow instructions about how to care for your hand. Get help right away if you have severe pain or your fingers change color or become cold. This information is not intended to replace advice given to you by your health care provider. Make sure you discuss any questions you have with your healthcare provider. Document Revised: 11/21/2017 Document Reviewed: 11/21/2017 Elsevier Patient Education  [place].

## 2020-12-28 NOTE — Progress Notes (Signed)
BP 120/80   Pulse 80   Temp 98.7 F (37.1 C)   Ht 5' 8.62" (1.743 m)   Wt 200 lb 8 oz (90.9 kg)   SpO2 97%   BMI 29.94 kg/m    Subjective:    Patient ID: Brandon Hart, male    DOB: 21-Aug-1945, 75 y.o.   MRN: [id number]  HPI: Brandon Hart is a 75 y.o. male  Chief Complaint  Patient presents with   Hypertension   Hyperlipidemia   COPD    Unable to do spiro, waiting for mouth pieces   Requests a referral to ortho for return of Dupuytren's contracture to right finger and a referral to surgery for removal of cyst to right cheek.  HYPERTENSION / HYPERLIPIDEMIA Continues on Lipitor and Norvasc + Lisinopril-HCTZ.    Satisfied with current treatment? yes Duration of hypertension: chronic BP monitoring frequency: not checking BP range: not checking BP medication side effects: no Duration of hyperlipidemia: chronic Cholesterol medication side effects: no Cholesterol supplements: none Medication compliance: good compliance Aspirin: no Recent stressors: no Recurrent headaches: no Visual changes: no Palpitations: no Dyspnea: no Chest pain: no Lower extremity edema: no Dizzy/lightheaded: no    COPD Last FEV1 70% and FEV1/FVC 98% pre in February 2021.  Currently no maintenance medication, using Albuterol as needed.  Continues to smoke 1/2 pack per day, this is down from previous 1 PPD and encouraged him to continue reduction.  He is not interested in medication to assist with quitting.    Had last lung CT screening in March 2022 -- with mild centrilobular and paraseptal emphysema noted + aortic atherosclerosis. COPD status: stable Satisfied with current treatment?: yes Oxygen use: no Dyspnea frequency: none Cough frequency: occasional per patient Rescue inhaler frequency: only a few times since starting this Limitation of activity: no Productive cough: none Last Spirometry: today 06/19/2019 Pneumovax: Not up to Date Influenza: Not Up to Date    INSOMNIA Has taken Melatonin in past without much benefit. Had issues with sleeping for years.  He has some Melatonin at home and is going to try to increase to 10 MG. Duration: chronic Satisfied with sleep quality: no Difficulty falling asleep: no Difficulty staying asleep: yes Waking a few hours after sleep onset: yes Early morning awakenings: yes Daytime hypersomnolence: no Wakes feeling refreshed: no Good sleep hygiene: yes Apnea: no Snoring: yes Depressed/anxious mood: no Recent stress: no Restless legs/nocturnal leg cramps: no Chronic pain/arthritis: no History of sleep study: no Treatments attempted: melatonin    Relevant past medical, surgical, family and social history reviewed and updated as indicated. Interim medical history since our last visit reviewed. Allergies and medications reviewed and updated.  Review of Systems  Constitutional:  Negative for activity change, diaphoresis, fatigue and fever.  Respiratory:  Positive for wheezing (intermittent). Negative for cough, chest tightness and shortness of breath.   Cardiovascular:  Negative for chest pain, palpitations and leg swelling.  Gastrointestinal: Negative.   Musculoskeletal:  Positive for arthralgias.  Neurological: Negative.   Psychiatric/Behavioral:  Positive for sleep disturbance. Negative for decreased concentration, self-injury and suicidal ideas. The patient is not nervous/anxious.    Per HPI unless specifically indicated above     Objective:    BP 120/80   Pulse 80   Temp 98.7 F (37.1 C)   Ht 5' 8.62" (1.743 m)   Wt 200 lb 8 oz (90.9 kg)   SpO2 97%   BMI 29.94 kg/m   Wt Readings from Last 3 Encounters:  12/28/20 200 lb 8 oz (90.9 kg)  07/15/20 199 lb (90.3 kg)  07/06/20 199 lb (90.3 kg)    Physical Exam Vitals and nursing note reviewed.  Constitutional:      General: He is awake. He is not in acute distress.    Appearance: He is well-developed and well-groomed. He is not ill-appearing  or toxic-appearing.  HENT:     Head: Normocephalic and atraumatic.     Right Ear: Hearing normal. No drainage.     Left Ear: Hearing normal. No drainage.  Eyes:     General: Lids are normal.        Right eye: No discharge.        Left eye: No discharge.     Conjunctiva/sclera: Conjunctivae normal.     Pupils: Pupils are equal, round, and reactive to light.  Neck:     Thyroid: No thyromegaly.     Vascular: No carotid bruit.  Cardiovascular:     Rate and Rhythm: Normal rate and regular rhythm.     Heart sounds: Normal heart sounds, S1 normal and S2 normal. No murmur heard.   No gallop.  Pulmonary:     Effort: Pulmonary effort is normal. No accessory muscle usage or respiratory distress.     Breath sounds: Normal breath sounds.  Abdominal:     General: Bowel sounds are normal.     Palpations: Abdomen is soft.  Musculoskeletal:     Right hand: Deformity (contracture to right 4th finger.) present. No swelling, lacerations, tenderness or bony tenderness. Decreased range of motion. Normal pulse.     Left hand: Normal.     Cervical back: Normal range of motion and neck supple.     Right lower leg: No edema.     Left lower leg: No edema.  Lymphadenopathy:     Cervical: No cervical adenopathy.  Skin:    General: Skin is warm and dry.     Capillary Refill: Capillary refill takes less than 2 seconds.     Findings: No rash.       Neurological:     Mental Status: He is alert and oriented to person, place, and time.     Deep Tendon Reflexes: Reflexes are normal and symmetric.  Psychiatric:        Attention and Perception: Attention normal.        Mood and Affect: Mood normal.        Speech: Speech normal.        Behavior: Behavior normal. Behavior is cooperative.        Thought Content: Thought content normal.    Results for orders placed or performed in visit on 06/29/20  CBC with Differential/Platelet  Result Value Ref Range   WBC 8.9 3.4 - 10.8 x10E3/uL   RBC 4.81 4.14 - 5.80  x10E6/uL   Hemoglobin 14.5 13.0 - 17.7 g/dL   Hematocrit 43.2 37.5 - 51.0 %   MCV 90 79 - 97 fL   MCH 30.1 26.6 - 33.0 pg   MCHC 33.6 31.5 - 35.7 g/dL   RDW 12.7 11.6 - 15.4 %   Platelets 202 150 - 450 x10E3/uL   Neutrophils 56 Not Estab. %   Lymphs 32 Not Estab. %   Monocytes 8 Not Estab. %   Eos 2 Not Estab. %   Basos 1 Not Estab. %   Neutrophils Absolute 5.1 1.4 - 7.0 x10E3/uL   Lymphocytes Absolute 2.8 0.7 - 3.1 x10E3/uL   Monocytes Absolute 0.7 0.1 -  0.9 x10E3/uL   EOS (ABSOLUTE) 0.1 0.0 - 0.4 x10E3/uL   Basophils Absolute 0.1 0.0 - 0.2 x10E3/uL   Immature Granulocytes 1 Not Estab. %   Immature Grans (Abs) 0.0 0.0 - 0.1 x10E3/uL  Comprehensive metabolic panel  Result Value Ref Range   Glucose 100 (H) 65 - 99 mg/dL   BUN 16 8 - 27 mg/dL   Creatinine, Ser 1.02 0.76 - 1.27 mg/dL   GFR calc non Af Amer 73 >59 mL/min/1.73   GFR calc Af Amer 84 >59 mL/min/1.73   BUN/Creatinine Ratio 16 10 - 24   Sodium 137 134 - 144 mmol/L   Potassium 4.0 3.5 - 5.2 mmol/L   Chloride 97 96 - 106 mmol/L   CO2 23 20 - 29 mmol/L   Calcium 9.4 8.6 - 10.2 mg/dL   Total Protein 7.1 6.0 - 8.5 g/dL   Albumin 4.6 3.7 - 4.7 g/dL   Globulin, Total 2.5 1.5 - 4.5 g/dL   Albumin/Globulin Ratio 1.8 1.2 - 2.2   Bilirubin Total 0.4 0.0 - 1.2 mg/dL   Alkaline Phosphatase 108 44 - 121 IU/L   AST 13 0 - 40 IU/L   ALT 21 0 - 44 IU/L  Lipid Panel w/o Chol/HDL Ratio  Result Value Ref Range   Cholesterol, Total 140 100 - 199 mg/dL   Triglycerides 202 (H) 0 - 149 mg/dL   HDL 30 (L) >39 mg/dL   VLDL Cholesterol Cal 34 5 - 40 mg/dL   LDL Chol Calc (NIH) 76 0 - 99 mg/dL  TSH  Result Value Ref Range   TSH 2.360 0.450 - 4.500 uIU/mL  PSA  Result Value Ref Range   Prostate Specific Ag, Serum 4.4 (H) 0.0 - 4.0 ng/mL      Assessment & Plan:   Problem List Items Addressed This Visit       Cardiovascular and Mediastinum   Hypertension    Chronic, stable with BP at goal today.  Recommend he start checking BP  at home three mornings a week and documenting for provider.  Discussed goal BP with him.  Continue current medication regimen and adjust as needed.  Return in 6 months.  CMP today.  Refills up to date.      Aortic atherosclerosis ([place])    Noted on February 2021 CT screening, continue statin daily and recommend daily Baby ASA.  Recommend complete cessation smoking for prevention.        Respiratory   Centrilobular emphysema ([facility]) - Primary    Chronic, stable without current maintenance medications.  Spirometry in February 2021 noting FEV1 71% and FEV1/FVC 70% pre.  Continue to recommend complete smoking cessation.  Continue Albuterol as needed, has used minimally.   If frequent use consider addition of maintenance inhaler, consider Anoro which appears to be covered.  Continue annual lung screening.  Recommend complete cessation of smoking.  Spirometry next visit.  Return in 6 months.        Musculoskeletal and Integument   Dupuytren's contracture    Noted to right ring finger with decreased ROM present.  Referral to [facility] who has assisted him with this before.  Return if any worsening or ongoing.      Relevant Orders   Ambulatory referral to Orthopedics   Epidermal cyst of face    To right side of face near upper mandible.  Patient requests referral for removal, would prefer general surgery.  Will place referral.      Relevant Orders  Ambulatory referral to General Surgery     Other   Nicotine dependence, cigarettes, uncomplicated    I have recommended complete cessation of tobacco use. I have discussed various options available for assistance with tobacco cessation including over the counter methods (Nicotine gum, patch and lozenges). We also discussed prescription options (Chantix, Nicotine Inhaler / Nasal Spray). The patient is not interested in pursuing any prescription tobacco cessation options at this time.       Insomnia    Chronic, ongoing.  Discussed various options  available, risks and benefits of each. At this time he wishes to continue Melatonin and increase to 10 MG, has only been taking 5 MG.  Recommend focus on sleep hygiene techniques.  Could consider OSA testing in future and if sleep aide needed consider Trazodone.  Return in 6 months, sooner if worsening.      Hyperlipidemia    Chronic, ongoing.  Continue current medication regimen and adjust as needed.  Lipid panel today.   Return in 6 months.      Relevant Orders   Comprehensive metabolic panel   Lipid Panel w/o Chol/HDL Ratio     Follow up plan: Return in about 6 months (around 06/30/2021) for Annual physical with spirometry -- due after 06/29/21.

## 2020-12-28 NOTE — Assessment & Plan Note (Signed)
Noted on February 2021 CT screening, continue statin daily and recommend daily Baby ASA.  Recommend complete cessation smoking for prevention.

## 2020-12-28 NOTE — Assessment & Plan Note (Signed)
Chronic, ongoing.  Discussed various options available, risks and benefits of each. At this time he wishes to continue Melatonin and increase to 10 MG, has only been taking 5 MG.  Recommend focus on sleep hygiene techniques.  Could consider OSA testing in future and if sleep aide needed consider Trazodone.  Return in 6 months, sooner if worsening.

## 2020-12-28 NOTE — Assessment & Plan Note (Signed)
Chronic, stable without current maintenance medications.  Spirometry in February 2021 noting FEV1 71% and FEV1/FVC 70% pre.  Continue to recommend complete smoking cessation.  Continue Albuterol as needed, has used minimally.   If frequent use consider addition of maintenance inhaler, consider Anoro which appears to be covered.  Continue annual lung screening.  Recommend complete cessation of smoking.  Spirometry next visit.  Return in 6 months.

## 2020-12-28 NOTE — Assessment & Plan Note (Signed)
I have recommended complete cessation of tobacco use. I have discussed various options available for assistance with tobacco cessation including over the counter methods (Nicotine gum, patch and lozenges). We also discussed prescription options (Chantix, Nicotine Inhaler / Nasal Spray). The patient is not interested in pursuing any prescription tobacco cessation options at this time.  

## 2020-12-28 NOTE — Assessment & Plan Note (Signed)
Chronic, stable with BP at goal today.  Recommend he start checking BP at home three mornings a week and documenting for provider.  Discussed goal BP with him.  Continue current medication regimen and adjust as needed.  Return in 6 months.  CMP today.  Refills up to date.

## 2020-12-29 LAB — COMPREHENSIVE METABOLIC PANEL
ALT: 19 IU/L (ref 0–44)
AST: 16 IU/L (ref 0–40)
Albumin/Globulin Ratio: 2 (ref 1.2–2.2)
Albumin: 4.5 g/dL (ref 3.7–4.7)
Alkaline Phosphatase: 116 IU/L (ref 44–121)
BUN/Creatinine Ratio: 17 (ref 10–24)
BUN: 17 mg/dL (ref 8–27)
Bilirubin Total: 0.3 mg/dL (ref 0.0–1.2)
CO2: 26 mmol/L (ref 20–29)
Calcium: 9.3 mg/dL (ref 8.6–10.2)
Chloride: 102 mmol/L (ref 96–106)
Creatinine, Ser: 0.98 mg/dL (ref 0.76–1.27)
Globulin, Total: 2.3 g/dL (ref 1.5–4.5)
Glucose: 100 mg/dL — ABNORMAL HIGH (ref 65–99)
Potassium: 4.2 mmol/L (ref 3.5–5.2)
Sodium: 140 mmol/L (ref 134–144)
Total Protein: 6.8 g/dL (ref 6.0–8.5)
eGFR: 82 mL/min/{1.73_m2} (ref >59)

## 2020-12-29 LAB — LIPID PANEL W/O CHOL/HDL RATIO
Cholesterol, Total: 123 mg/dL (ref 100–199)
HDL: 29 mg/dL — ABNORMAL LOW (ref >39)
LDL Chol Calc (NIH): 62 mg/dL (ref 0–99)
Triglycerides: 192 mg/dL — ABNORMAL HIGH (ref 0–149)
VLDL Cholesterol Cal: 32 mg/dL (ref 5–40)

## 2020-12-29 NOTE — Progress Notes (Signed)
Good morning, please let Brandon Hart know labs have returned and overall they continue to be stable.  No medication changes needed.  Have a great day!! Keep being awesome!!  Thank you for allowing me to participate in your care.  I appreciate you. Kindest regards, Fredna Stricker

## 2021-01-04 ENCOUNTER — Ambulatory Visit: Payer: Medicare Other | Admitting: Surgery

## 2021-01-04 ENCOUNTER — Other Ambulatory Visit: Payer: Self-pay

## 2021-01-04 ENCOUNTER — Encounter: Payer: Self-pay | Admitting: Surgery

## 2021-01-04 VITALS — BP 125/81 | HR 86 | Temp 98.9°F | Ht 69.0 in | Wt 199.0 lb

## 2021-01-04 DIAGNOSIS — L720 Epidermal cyst: Secondary | ICD-10-CM

## 2021-01-04 NOTE — Progress Notes (Signed)
01/04/2021  Reason for Visit:  Cyst of right cheek  Referring Provider:  Marnee Guarneri, NP  History of Present Illness: Brandon Hart is a 75 y.o. male presenting for evaluation of a right cheek cyst.  The patient reports that he's had this for a few years and reports intermittent episodes where the area swells up and he would then manually squeeze and let it drain.  He reports that he drainage looks like purulent fluid.  He has not required any antibiotic treatment for it and has not had any procedures for it in the past.  Denies any episodes of significant tenderness, redness/induration, fevers, chills, or any troubles with the facial muscles.  Past Medical History: Past Medical History:  Diagnosis Date   Hypertension      Past Surgical History: Past Surgical History:  Procedure Laterality Date   COLONOSCOPY WITH PROPOFOL N/A 04/11/2016   Procedure: COLONOSCOPY WITH PROPOFOL;  Surgeon: Jonathon Bellows, MD;  Location: [facility] ENDOSCOPY;  Service: Endoscopy;  Laterality: N/A;   HERNIA REPAIR     VASECTOMY      Home Medications: Prior to Admission medications   Medication Sig Start Date End Date Taking? Authorizing Provider  albuterol (PROAIR HFA) 108 (90 Base) MCG/ACT inhaler Inhale 2 puffs into the lungs every 6 (six) hours as needed for wheezing or shortness of breath. 12/24/19  Yes [name], Jolene T, NP  amLODipine (NORVASC) 5 MG tablet Take 1 tablet (5 mg total) by mouth daily. 06/29/20  Yes [name], Jolene T, NP  atorvastatin (LIPITOR) 20 MG tablet Take 1 tablet (20 mg total) by mouth daily. 06/29/20  Yes [name], Jolene T, NP  lisinopril-hydrochlorothiazide (ZESTORETIC) 20-25 MG tablet Take 1 tablet by mouth daily. 06/29/20  Yes [name], Henrine Screws T, NP    Allergies: No Known Allergies  Social History:  reports that he has been smoking cigarettes. He has a 25.00 pack-year smoking history. He has never used smokeless tobacco. He reports that he does not drink alcohol and does  not use drugs.   Family History: Family History  Problem Relation Age of Onset   Hypertension Mother    Breast cancer Mother    Hypertension Father    Colon cancer Father    Hypertension Brother    Hypertension Brother     Review of Systems: Review of Systems  Constitutional:  Negative for chills and fever.  Respiratory:  Negative for shortness of breath.   Cardiovascular:  Negative for chest pain.  Gastrointestinal:  Negative for nausea and vomiting.  Skin:  Negative for rash.       Cyst of right posterior cheek   Physical Exam BP 125/81   Pulse 86   Temp 98.9 F (37.2 C)   Ht '5\' 9"'$  (1.753 m)   Wt 199 lb (90.3 kg)   SpO2 97%   BMI 29.39 kg/m  CONSTITUTIONAL: No acute distress, well nourished. RESPIRATORY:  Normal respiratory effort without pathologic use of accessory muscles. CARDIOVASCULAR: Regular rhythm and rate. MUSCULOSKELETAL:  Normal muscle strength and tone in all four extremities.  No peripheral edema or cyanosis. SKIN: The patient has a 1.5 cm superficial mass in the post-auricular area, just anterior to the TMJ, with an entry skin pore, consistent with sebaceous cyst.  The area is soft, mobile, circumscribed, without any erythema or induration NEUROLOGIC:  Motor and sensation is grossly normal.  Cranial nerves are grossly intact. PSYCH:  Alert and oriented to person, place and time. Affect is normal.  Laboratory Analysis: No results found  for this or any previous visit (from the past 24 hour(s)).  Imaging: No results found.  Assessment and Plan: This is a 75 y.o. male with likely a sebaceous cyst of the right post-auricular area.  --based on exam and history, this is more consistent with a sebaceous cyst and it feels more superficial than the parotid gland itself.  Discussed with him that we can offer excision in the office.  Discussed with him the procedure at length, including the incision, sutures, risks of bleeding, infection, injury to surrounding  structures, post-op restrictions and pain control.  He's willing to proceed. --Will schedule for office procedure on 01/15/21  Face-to-face time spent with the patient and care providers was 40 minutes, with more than 50% of the time spent counseling, educating, and coordinating care of the patient.     Melvyn Neth, [facility]

## 2021-01-04 NOTE — Patient Instructions (Signed)
You may shave the night prior to your procedure. Do not shave the morning of.   Epidermoid Cyst Removal Epidermoid cyst removal is a procedure to remove a fluid-filled sac that forms under your skin (epidermoid cyst). This type of cyst is filled with a thick, oily substance (keratin) that is secreted by your skin glands. Epidermoid cysts may also be called epidermal cysts, or keratin cysts. Normally, the skin secretes this pasty material through a gland or a hair follicle. However, when a skin gland or hairfollicle becomes blocked, an epidermoid cyst can form. You may need this procedure if you have an epidermal cyst that becomes large,uncomfortable, or inflamed. Tell a health care provider about: Any allergies you have. All medicines you are taking, including vitamins, herbs, eye drops, creams, and over-the-counter medicines. Any problems you or family members have had with anesthetic medicines. Any blood disorders you have. Any surgeries you have had. Any medical conditions you have now or have had. Whether you are pregnant or may be pregnant. What are the risks? Generally, this is a safe procedure. However, problems may occur, including: Recurrence of the cyst. Bleeding. Infection. Scarring. What happens before the procedure? Ask your health care provider about: Changing or stopping your regular medicines. This is especially important if you are taking diabetes medicines or blood thinners. Taking medicines such as aspirin and ibuprofen. These medicines can thin your blood. Do not take these medicines unless your health care provider tells you to take them. Taking over-the-counter medicines, vitamins, herbs, and supplements. If you have an inflamed or infected cyst, you may have to take antibiotic medicine before the cyst removal. Take your antibiotic as told by your health care provider. Do not stop taking the antibiotic even if you start to feel better. Take a shower on the morning of  your procedure. Your health care provider may ask you to use a germ-killing soap. What happens during the procedure?  You will be given a medicine to numb the area (local anesthetic). The skin around the cyst will be cleaned with a germ-killing solution. The health care provider will make a small incision in your skin over the cyst. The health care provider will separate the cyst from the surrounding tissues that are under your skin. If possible, the cyst will be removed undamaged (intact). If the cyst bursts (ruptures), it will be removed in pieces. After the cyst is removed, the health care provider will control any bleeding and close the incision with small stitches (sutures). Small incisions may not need sutures, and the bleeding will be controlled by applying direct pressure with gauze. The health care provider may apply antibiotic ointment and a bandage (dressing) over the incision. The procedure may vary among health care providers and hospitals. What happens after the procedure? If you are prescribed an antibiotic medicine or ointment, take or apply it as told by your health care provider. Do not stop using the antibiotic even if you start to feel better. Summary Epidermoid cyst removal is a procedure to remove a sac that has formed under your skin. You may need this procedure if you have an epidermoid cyst that becomes large, uncomfortable, or inflamed. The health care provider will make a small incision in your skin to remove the cyst. If you are prescribed an antibiotic medicine before the procedure, after the procedure, or both, use the antibiotic as told by your health care provider. Do not stop using the antibiotic even if you start to feel better. This information is  not intended to replace advice given to you by your health care provider. Make sure you discuss any questions you have with your healthcare provider. Document Revised: 08/07/2019 Document Reviewed: 08/07/2019 Elsevier  Patient Education  [employer].

## 2021-01-12 DIAGNOSIS — M720 Palmar fascial fibromatosis [Dupuytren]: Secondary | ICD-10-CM | POA: Diagnosis not present

## 2021-01-15 ENCOUNTER — Ambulatory Visit (INDEPENDENT_AMBULATORY_CARE_PROVIDER_SITE_OTHER): Payer: Medicare Other | Admitting: Surgery

## 2021-01-15 ENCOUNTER — Other Ambulatory Visit: Payer: Self-pay

## 2021-01-15 ENCOUNTER — Other Ambulatory Visit: Payer: Self-pay | Admitting: Surgery

## 2021-01-15 ENCOUNTER — Encounter: Payer: Self-pay | Admitting: Surgery

## 2021-01-15 VITALS — BP 108/74 | HR 91 | Temp 98.6°F | Ht 69.0 in | Wt 197.0 lb

## 2021-01-15 DIAGNOSIS — L723 Sebaceous cyst: Secondary | ICD-10-CM | POA: Diagnosis not present

## 2021-01-15 DIAGNOSIS — L720 Epidermal cyst: Secondary | ICD-10-CM | POA: Diagnosis not present

## 2021-01-15 NOTE — Progress Notes (Addendum)
  Procedure Date:  01/15/2021  Pre-operative Diagnosis: Right postauricular sebaceous cyst  Post-operative Diagnosis: Right postauricular sebaceous cyst  Procedure:  Excision of right postauricular sebaceous cyst, layered closure of 2 cm incision  Surgeon:  Melvyn Neth, MD  Anesthesia:  General endotracheal  Estimated Blood Loss: 2 ml  Specimens: Sebaceous cyst  Complications: None  Indications for Procedure:  This is a 75 y.o. male with diagnosis of a symptomatic right postauricular sebaceous cyst which had been infected in the past.  The patient wishes to have this excised. The risks of bleeding, abscess or infection, injury to surrounding structures, and need for further procedures were all discussed with the patient and he was willing to proceed.  Description of Procedure: The patient was correctly identified at bedside.  The patient was placed supine.  Appropriate time-outs were performed.  The patient's right postauricular area was prepped and draped in usual sterile fashion.  Local anesthetic was infused intradermally.  A 2 cm incision was made over the cyst, and scalpel was used to dissect down the skin and subcutaneous tissue.  Skin flaps were created sharply, and then the cyst was excised intact.  It measured approximately 1.2 cm.  It was sent off to pathology.  The cavity was then irrigated and hemostasis was assured with manual pressure.  The wound was then closed in two layers using 3-0 Vicryl and 4-0 Monocryl.  The incision was cleaned and sealed with DermaBond.  The patient tolerated the procedure well and all sharps were appropriately disposed of at the end of the case.  -Patient may take Tylenol or ibuprofen for pain control. - Patient may shower. -Instructed the patient to avoid shaving on that area so that the wound continues to heal well. - Follow-up in 1 week for wound check.   Melvyn Neth, MD

## 2021-01-15 NOTE — Patient Instructions (Signed)
We have removed a Cyst in our office today.  You have sutures under the skin that will dissolve and also dermabond (skin glue) on top of your skin which will come off on it's own in 10-14 days.  You may shower in 48 hours, do not scrub at the area. Do not shave the area.   Avoid Strenuous activities that will make you sweat during the next 48 hours to avoid the glue coming off prematurely. Avoid activities that will place pressure to this area of the body for 1-2 weeks to avoid re-injury to incision site.  Please see your follow-up appointment provided. We will see you back in office to make sure this area is healed and to review the final pathology. If you have any questions or concerns prior to this appointment, call our office and speak with a nurse.    Excision of Skin Cysts or Lesions Excision of a skin lesion refers to the removal of a section of skin by making small cuts (incisions) in the skin. This procedure may be done to remove a cancerous (malignant) or noncancerous (benign) growth on the skin. It is typically done to treat or prevent cancer or infection. It may also be done to improve cosmetic appearance. The procedure may be done to remove: Cancerous growths, such as basal cell carcinoma, squamous cell carcinoma, or melanoma. Noncancerous growths, such as a cyst or lipoma. Growths, such as moles or skin tags, which may be removed for cosmetic reasons.  Various excision or surgical techniques may be used depending on your condition, the location of the lesion, and your overall health. Tell a health care provider about: Any allergies you have. All medicines you are taking, including vitamins, herbs, eye drops, creams, and over-the-counter medicines. Any problems you or family members have had with anesthetic medicines. Any blood disorders you have. Any surgeries you have had. Any medical conditions you have. Whether you are pregnant or may be pregnant. What are the  risks? Generally, this is a safe procedure. However, problems may occur, including: Bleeding. Infection. Scarring. Recurrence of the cyst, lipoma, or cancer. Changes in skin sensation or appearance, such as discoloration or swelling. Reaction to the anesthetics. Allergic reaction to surgical materials or ointments. Damage to nerves, blood vessels, muscles, or other structures. Continued pain.  What happens before the procedure? Ask your health care provider about: Changing or stopping your regular medicines. This is especially important if you are taking diabetes medicines or blood thinners. Taking medicines such as aspirin and ibuprofen. These medicines can thin your blood. Do not take these medicines before your procedure if your health care provider instructs you not to. You may be asked to take certain medicines. You may be asked to stop smoking. You may have an exam or testing. Plan to have someone take you home after the procedure. Plan to have someone help you with activities during recovery. What happens during the procedure? To reduce your risk of infection: Your health care team will wash or sanitize their hands. Your skin will be washed with soap. You will be given a medicine to numb the area (local anesthetic). One of the following excision techniques will be performed. At the end of any of these procedures, antibiotic ointment will be applied as needed. Each of the following techniques may vary among health care providers and hospitals. Complete Surgical Excision The area of skin that needs to be removed will be marked with a pen. Using a small scalpel or scissors, the surgeon  will gently cut around and under the lesion until it is completely removed. The lesion will be placed in a fluid and sent to the lab for examination. If necessary, bleeding will be controlled with a device that delivers heat (electrocautery). The edges of the wound may be stitched (sutured) together,  and a bandage (dressing) will be applied. This procedure may be performed to treat a cancerous growth or a noncancerous cyst or lesion. Excision of a Cyst The surgeon will make an incision on the cyst. The entire cyst will be removed through the incision. The incision may be closed with sutures. Shave Excision During shave excision, the surgeon will use a small blade or an electrically heated loop instrument to shave off the lesion. This may be done to remove a mole or a skin tag. The wound will usually be left to heal on its own without sutures. Punch Excision During punch excision, the surgeon will use a small tool that is like a cookie cutter or a hole punch to cut a circle shape out of the skin. The outer edges of the skin will be sutured together. This may be done to remove a mole or a scar or to perform a biopsy of the lesion. Mohs Micrographic Surgery During Mohs micrographic surgery, layers of the lesion will be removed with a scalpel or a loop instrument and will be examined right away under a microscope. Layers will be removed until all of the abnormal or cancerous tissue has been removed. This procedure is minimally invasive, and it ensures the best cosmetic outcome. It involves the removal of as little normal tissue as possible. Mohs is usually done to treat skin cancer, such as basal cell carcinoma or squamous cell carcinoma, particularly on the face and ears. Depending on the size of the surgical wound, it may be sutured closed. What happens after the procedure? Return to your normal activities as told by your health care provider. Talk with your health care provider to discuss any test results, treatment options, and if necessary, the need for more tests. This information is not intended to replace advice given to you by your health care provider. Make sure you discuss any questions you have with your health care provider. Document Released: 07/27/2009 Document Revised: 10/08/2015  Document Reviewed: 06/18/2014 Elsevier Interactive Patient Education  [employer].

## 2021-01-22 ENCOUNTER — Ambulatory Visit (INDEPENDENT_AMBULATORY_CARE_PROVIDER_SITE_OTHER): Payer: Medicare Other | Admitting: Surgery

## 2021-01-22 ENCOUNTER — Encounter: Payer: Self-pay | Admitting: Surgery

## 2021-01-22 ENCOUNTER — Other Ambulatory Visit: Payer: Self-pay

## 2021-01-22 VITALS — BP 117/75 | HR 92 | Temp 98.1°F | Ht 69.0 in | Wt 198.0 lb

## 2021-01-22 DIAGNOSIS — Z09 Encounter for follow-up examination after completed treatment for conditions other than malignant neoplasm: Secondary | ICD-10-CM

## 2021-01-22 DIAGNOSIS — L720 Epidermal cyst: Secondary | ICD-10-CM

## 2021-01-22 NOTE — Progress Notes (Signed)
01/22/2021  HPI: Brandon Hart is a 75 y.o. male s/p excision of a right facial sebaceous cyst on 01/15/2021.  Patient presents today for follow-up.  Pathology resulted on epidermal cyst with no evidence of malignancy or atypia.  Patient reports that he is doing well denies any significant pain, drainage, or issues with the incision.  Vital signs: BP 117/75   Pulse 92   Temp 98.1 F (36.7 C) (Oral)   Ht '5\' 9"'$  (1.753 m)   Wt 198 lb (89.8 kg)   SpO2 95%   BMI 29.24 kg/m    Physical Exam: Constitutional: No acute distress Skin: Right postauricular incision is healing well, with no evidence of wound breakdown, infection, or other complications.  Assessment/Plan: This is a 75 y.o. male s/p excision of a right postauricular sebaceous cyst.  - Reviewed pathology results with the patient. - Patient is healing well with no complications. - Follow-up as needed.   Melvyn Neth, [facility]

## 2021-01-22 NOTE — Patient Instructions (Signed)
   Follow-up with our office as needed.  Please call and ask to speak with a nurse if you develop questions or concerns.  

## 2021-02-02 DIAGNOSIS — M720 Palmar fascial fibromatosis [Dupuytren]: Secondary | ICD-10-CM | POA: Diagnosis not present

## 2021-06-27 NOTE — Patient Instructions (Signed)
COPD and Physical Activity Chronic obstructive pulmonary disease (COPD) is a long-term, or chronic, condition that affects the lungs. COPD is a general term that can be used to describe many problems that cause inflammation of the lungs and limit airflow. These conditions include chronic bronchitis and emphysema. The main symptom of COPD is shortness of breath, which makes it harder to do even simple tasks. This can also make it harder to exercise and stay active. Talk with your health care provider about treatments to help you breathe better and actions you can take to prevent breathing problems during physical activity. What are the benefits of exercising when you have COPD? Exercising regularly is an important part of a healthy lifestyle. You can still exercise and do physical activities even though you have COPD. Exercise and physical activity improve your shortness of breath by increasing blood flow (circulation). This causes your heart to pump more oxygen through your body. Moderate exercise can: Improve oxygen use. Increase your energy level. Help with shortness of breath. Strengthen your breathing muscles. Improve heart health. Help with sleep. Improve your self-esteem and feelings of self-worth. Lower depression, stress, and anxiety. Exercise can benefit everyone with COPD. The severity of your disease may affect how hard you can exercise, especially at first, but everyone can benefit. Talk with your health care provider about how much exercise is safe for you, and which activities and exercises are safe for you. What actions can I take to prevent breathing problems during physical activity? Sign up for a pulmonary rehabilitation program. This type of program may include: Education about lung diseases. Exercise classes that teach you how to exercise and be more active while improving your breathing. This usually involves: Exercise using your lower extremities, such as a stationary  bicycle. About 30 minutes of exercise, 2 to 5 times per week, for 6 to 12 weeks. Strength training, such as push-ups or leg lifts. Nutrition education. Group classes in which you can talk with others who also have COPD and learn ways to manage stress. If you use an oxygen tank, you should use it while you exercise. Work with your health care provider to adjust your oxygen for your physical activity. Your resting flow rate is different from your flow rate during physical activity. How to manage your breathing while exercising While you are exercising: Take slow breaths. Pace yourself, and do nottry to go too fast. Purse your lips while breathing out. Pursing your lips is similar to a kissing or whistling position. If doing exercise that uses a quick burst of effort, such as weight lifting: Breathe in before starting the exercise. Breathe out during the hardest part of the exercise, such as raising the weights. Where to find support You can find support for exercising with COPD from: Your health care provider. A pulmonary rehabilitation program. Your local health department or community health programs. Support groups, either online or in-person. Your health care provider may be able to recommend support groups. Where to find more information You can find more information about exercising with COPD from: American Lung Association: lung.org COPD Foundation: copdfoundation.org Contact a health care provider if: Your symptoms get worse. You have nausea. You have a fever. You want to start a new exercise program or a new activity. Get help right away if: You have chest pain. You cannot breathe. These symptoms may represent a serious problem that is an emergency. Do not wait to see if the symptoms will go away. Get medical help right away. Call   your local emergency services (911 in the [place] Do not drive yourself to the hospital. Summary COPD is a general term that can be used to describe  many different lung problems that cause lung inflammation and limit airflow. This includes chronic bronchitis and emphysema. Exercise and physical activity improve your shortness of breath by increasing blood flow (circulation). This causes your heart to provide more oxygen to your body. Contact your health care provider before starting any exercise program or new activity. Ask your health care provider what exercises and activities are safe for you. This information is not intended to replace advice given to you by your health care provider. Make sure you discuss any questions you have with your health care provider. Document Revised: 03/10/2020 Document Reviewed: 03/10/2020 Elsevier Patient Education  2022 Elsevier Inc.  

## 2021-06-30 ENCOUNTER — Encounter: Payer: Self-pay | Admitting: Nurse Practitioner

## 2021-06-30 ENCOUNTER — Ambulatory Visit (INDEPENDENT_AMBULATORY_CARE_PROVIDER_SITE_OTHER): Payer: Medicare Other | Admitting: Nurse Practitioner

## 2021-06-30 ENCOUNTER — Other Ambulatory Visit: Payer: Self-pay

## 2021-06-30 VITALS — BP 117/76 | HR 96 | Temp 98.2°F | Ht 69.02 in | Wt 199.8 lb

## 2021-06-30 DIAGNOSIS — E781 Pure hyperglyceridemia: Secondary | ICD-10-CM

## 2021-06-30 DIAGNOSIS — J432 Centrilobular emphysema: Secondary | ICD-10-CM | POA: Diagnosis not present

## 2021-06-30 DIAGNOSIS — I700 Atherosclerosis of aorta: Secondary | ICD-10-CM | POA: Diagnosis not present

## 2021-06-30 DIAGNOSIS — R079 Chest pain, unspecified: Secondary | ICD-10-CM | POA: Diagnosis not present

## 2021-06-30 DIAGNOSIS — I10 Essential (primary) hypertension: Secondary | ICD-10-CM

## 2021-06-30 DIAGNOSIS — F17210 Nicotine dependence, cigarettes, uncomplicated: Secondary | ICD-10-CM | POA: Diagnosis not present

## 2021-06-30 DIAGNOSIS — Z0000 Encounter for general adult medical examination without abnormal findings: Secondary | ICD-10-CM

## 2021-06-30 DIAGNOSIS — E559 Vitamin D deficiency, unspecified: Secondary | ICD-10-CM | POA: Diagnosis not present

## 2021-06-30 DIAGNOSIS — R911 Solitary pulmonary nodule: Secondary | ICD-10-CM | POA: Diagnosis not present

## 2021-06-30 MED ORDER — TIOTROPIUM BROMIDE-OLODATEROL 2.5-2.5 MCG/ACT IN AERS: 2.0000 | INHALATION_SPRAY | Freq: Every day | RESPIRATORY_TRACT | 12 refills | Status: DC

## 2021-06-30 NOTE — Assessment & Plan Note (Signed)
Noted on February 2021 CT screening, continue statin daily and recommend daily Baby ASA.  Recommend complete cessation smoking for prevention.

## 2021-06-30 NOTE — Assessment & Plan Note (Signed)
Noted x 1 nodule 2 mm left upper lobe recent lung screening, non worrisome on report.  Continue annual lung screening and discussed with patient.  Recommend complete smoking cessation.

## 2021-06-30 NOTE — Assessment & Plan Note (Signed)
Chronic, stable with BP at goal today.  Recommend he start checking BP at home three mornings a week and documenting for provider.  Discussed goal BP with him.  Continue current medication regimen and adjust as needed.  Return in 6 months.  CMP, CBC, TSH, urine ALB today.  Refills sent.

## 2021-06-30 NOTE — Assessment & Plan Note (Deleted)
Noted to right ring finger with decreased ROM present.  Referral to [facility] as needed, who has assisted him with this before.  Return if any worsening or ongoing.

## 2021-06-30 NOTE — Assessment & Plan Note (Signed)
Chronic, stable without current maintenance medications.  Spirometry in February 2021 noting FEV1 71% and FEV1/FVC 70% pre today levels with slight downward trend FEV1 51% and FEV1/FVC 90% pre.  Continue to recommend complete smoking cessation.  Will start Stiolto daily and continue Albuterol as needed, provided sample of Stiolto in office today.  Educated him at length on emphysema and progressive nature of disease.   Continue annual lung screening.  Recommend complete cessation of smoking.  Return in 6 weeks.

## 2021-06-30 NOTE — Assessment & Plan Note (Signed)
Chronic, ongoing.  Continue current medication regimen and adjust as needed.  Lipid panel today.  Return in 6 months. 

## 2021-06-30 NOTE — Assessment & Plan Note (Signed)
I have recommended complete cessation of tobacco use. I have discussed various options available for assistance with tobacco cessation including over the counter methods (Nicotine gum, patch and lozenges). We also discussed prescription options (Chantix, Nicotine Inhaler / Nasal Spray). The patient is not interested in pursuing any prescription tobacco cessation options at this time.  

## 2021-06-30 NOTE — Assessment & Plan Note (Signed)
Intermittent episodes over past weeks, higher risk patient with abnormal EKG findings.  Reviewed with patient.  Referral to cardiology and Echo ordered.  Would benefit further work-up, appreciate all input.

## 2021-06-30 NOTE — Progress Notes (Signed)
BP 117/76    Pulse 96    Temp 98.2 F (36.8 C) (Oral)    Ht 5' 9.02" (1.753 m)    Wt 199 lb 12.8 oz (90.6 kg)    SpO2 97%    BMI 29.49 kg/m    Subjective:    Patient ID: Brandon Hart, male    DOB: 1945-05-21, 76 y.o.   MRN: [id number]  HPI: Brandon Hart is a 76 y.o. male presenting on 06/30/2021 for comprehensive medical examination. Current medical complaints include:none  He currently lives with: self Interim Problems from his last visit: no   HYPERTENSION / HYPERLIPIDEMIA Continues on Lipitor and Norvasc + Lisinopril-HCTZ.   Satisfied with current treatment? yes Duration of hypertension: chronic BP monitoring frequency: not checking BP range: not checking BP medication side effects: no Duration of hyperlipidemia: chronic Cholesterol medication side effects: no Cholesterol supplements: none Medication compliance: good compliance Aspirin: no Recent stressors: no Recurrent headaches: no Visual changes: no Palpitations: no Dyspnea: no Chest pain: has had a few little pains over the past month to left side, last a few days ago -- lasted a few seconds, no diaphoresis, N&V, SOB, or radiation with episode Lower extremity edema: no Dizzy/lightheaded: no    COPD Continues to smoke 1/2 pack per day, this is down from previous 1 PPD and encouraged him to continue reduction.  He is not interested in medication to assist with quitting.  Had lung CT screening last 06/18/20 continuing to note centrilobular and paraseptal emphysema + aortic atherosclerosis.  Currently only uses Albuterol --- has not had to use yet. COPD status: stable Satisfied with current treatment?: yes Oxygen use: no Dyspnea frequency: none Cough frequency: occasional per patient Rescue inhaler frequency:  none Limitation of activity: no Productive cough: none Last Spirometry: today 06/19/2019 == FEV1 70% and FEV1/FVC 98% today FEV1 51% and FEV1/FVC 90% Pneumovax: Not up to Date --  refuses Influenza: Not Up to Date -- refuses  Functional Status Survey: Is the patient deaf or have difficulty hearing?: No Does the patient have difficulty seeing, even when wearing glasses/contacts?: No Does the patient have difficulty concentrating, remembering, or making decisions?: No Does the patient have difficulty walking or climbing stairs?: No Does the patient have difficulty dressing or bathing?: No Does the patient have difficulty doing errands alone such as visiting a doctor's office or shopping?: No  FALL RISK: Fall Risk  06/30/2021 01/04/2021 07/06/2020 06/29/2020 07/03/2019  Falls in the past year? 0 0 0 0 0  Number falls in past yr: 0 0 - - 0  Injury with Fall? 0 0 - 0 0  Risk for fall due to : No Fall Risks - Medication side effect - -  Follow up Falls prevention discussed - Falls evaluation completed;Education provided;Falls prevention discussed - -   Does endorse some loneliness at times, but goes and sees brother or his girlfriend and that helps mood.  His brother owns [employer].   Depression screen [facility] 2/9 06/30/2021 07/06/2020 06/29/2020 06/19/2019 06/15/2018  Decreased Interest 1 0 0 0 0  Down, Depressed, Hopeless 1 0 0 0 0  PHQ - 2 Score 2 0 0 0 0  Altered sleeping 1 - 0 - 0  Tired, decreased energy 1 - 0 - 0  Change in appetite 1 - 0 - 0  Feeling bad or failure about yourself  1 - 0 - 0  Trouble concentrating 0 - 0 - 0  Moving slowly or fidgety/restless 1 -  0 - 0  Suicidal thoughts 0 - 0 - 0  PHQ-9 Score 7 - 0 - 0  Difficult doing work/chores Somewhat difficult - - - Not difficult at all    Advanced Directives A voluntary discussion about advance care planning including the explanation and discussion of advance directives was extensively discussed  with the patient for 15 minutes with patient.  Explanation about the health care proxy and Living will was reviewed and packet with forms with explanation of how to fill them out was given.  During this discussion,  the patient was able to identify a health care proxy as his daughter and plans to fill out the paperwork required.  Patient was offered a separate [place] visit for further assistance with forms.     Past Medical History:  Past Medical History:  Diagnosis Date   Hypertension     Surgical History:  Past Surgical History:  Procedure Laterality Date   COLONOSCOPY WITH PROPOFOL N/A 04/11/2016   Procedure: COLONOSCOPY WITH PROPOFOL;  Surgeon: Jonathon Bellows, MD;  Location: [facility] ENDOSCOPY;  Service: Endoscopy;  Laterality: N/A;   HERNIA REPAIR     VASECTOMY      Medications:  Current Outpatient Medications on File Prior to Visit  Medication Sig   albuterol (PROAIR HFA) 108 (90 Base) MCG/ACT inhaler Inhale 2 puffs into the lungs every 6 (six) hours as needed for wheezing or shortness of breath.   amLODipine (NORVASC) 5 MG tablet Take 1 tablet (5 mg total) by mouth daily.   atorvastatin (LIPITOR) 20 MG tablet Take 1 tablet (20 mg total) by mouth daily.   lisinopril-hydrochlorothiazide (ZESTORETIC) 20-25 MG tablet Take 1 tablet by mouth daily.   No current facility-administered medications on file prior to visit.    Allergies:  No Known Allergies  Social History:  Social History   Socioeconomic History   Marital status: Single    Spouse name: Not on file   Number of children: Not on file   Years of education: Not on file   Highest education level: Not on file  Occupational History   Occupation: retired  Tobacco Use   Smoking status: Every Day    Packs/day: 0.50    Years: 50.00    Pack years: 25.00    Types: Cigarettes   Smokeless tobacco: Never  Vaping Use   Vaping Use: Never used  Substance and Sexual Activity   Alcohol use: No    Comment: pt states he has not drink anything in about a year   Drug use: No   Sexual activity: Yes  Other Topics Concern   Not on file  Social History Narrative   Not on file   Social Determinants of Health   Financial  Resource Strain: Low Risk    Difficulty of Paying Living Expenses: Not hard at all  Food Insecurity: No Food Insecurity   Worried About Charity fundraiser in the Last Year: Never true   [place] in the Last Year: Never true  Transportation Needs: No Transportation Needs   Lack of Transportation (Medical): No   Lack of Transportation (Non-Medical): No  Physical Activity: Inactive   Days of Exercise per Week: 0 days   Minutes of Exercise per Session: 0 min  Stress: No Stress Concern Present   Feeling of Stress : Not at all  Social Connections: Not on file  Intimate Partner Violence: Not on file   Social History   Tobacco Use  Smoking Status Every  Day   Packs/day: 0.50   Years: 50.00   Pack years: 25.00   Types: Cigarettes  Smokeless Tobacco Never   Social History   Substance and Sexual Activity  Alcohol Use No   Comment: pt states he has not drink anything in about a year    Family History:  Family History  Problem Relation Age of Onset   Hypertension Mother    Breast cancer Mother    Hypertension Father    Colon cancer Father    Hypertension Brother    Hypertension Brother     Past medical history, surgical history, medications, allergies, family history and social history reviewed with patient today and changes made to appropriate areas of the chart.   Review of Systems - negative All other ROS negative except what is listed above and in the HPI.      Objective:    BP 117/76    Pulse 96    Temp 98.2 F (36.8 C) (Oral)    Ht 5' 9.02" (1.753 m)    Wt 199 lb 12.8 oz (90.6 kg)    SpO2 97%    BMI 29.49 kg/m   Wt Readings from Last 3 Encounters:  06/30/21 199 lb 12.8 oz (90.6 kg)  01/22/21 198 lb (89.8 kg)  01/15/21 197 lb (89.4 kg)    Physical Exam Vitals and nursing note reviewed.  Constitutional:      General: He is awake. He is not in acute distress.    Appearance: He is well-developed. He is not ill-appearing.  HENT:     Head: Normocephalic and  atraumatic.     Right Ear: Hearing, tympanic membrane, ear canal and external ear normal. No drainage.     Left Ear: Hearing, tympanic membrane, ear canal and external ear normal. No drainage.     Nose: Nose normal.     Mouth/Throat:     Pharynx: Uvula midline.  Eyes:     General: Lids are normal.        Right eye: No discharge.        Left eye: No discharge.     Extraocular Movements: Extraocular movements intact.     Conjunctiva/sclera: Conjunctivae normal.     Pupils: Pupils are equal, round, and reactive to light.     Visual Fields: Right eye visual fields normal and left eye visual fields normal.  Neck:     Thyroid: No thyromegaly.     Vascular: No carotid bruit or JVD.     Trachea: Trachea normal.  Cardiovascular:     Rate and Rhythm: Normal rate and regular rhythm.     Heart sounds: Normal heart sounds, S1 normal and S2 normal. No murmur heard.   No gallop.  Pulmonary:     Effort: Pulmonary effort is normal. No accessory muscle usage or respiratory distress.     Breath sounds: Normal breath sounds.     Comments: Intermittent expiratory wheezes noted throughout.   Abdominal:     General: Bowel sounds are normal.     Palpations: Abdomen is soft. There is no hepatomegaly or splenomegaly.     Tenderness: There is no abdominal tenderness.  Genitourinary:    Comments: Deferred per patient request. Musculoskeletal:        General: Normal range of motion.     Cervical back: Normal range of motion and neck supple.     Right lower leg: No edema.     Left lower leg: No edema.  Lymphadenopathy:  Head:     Right side of head: No submental, submandibular, tonsillar, preauricular or posterior auricular adenopathy.     Left side of head: No submental, submandibular, tonsillar, preauricular or posterior auricular adenopathy.     Cervical: No cervical adenopathy.  Skin:    General: Skin is warm and dry.     Capillary Refill: Capillary refill takes less than 2 seconds.      Findings: No rash.  Neurological:     Mental Status: He is alert and oriented to person, place, and time.     Gait: Gait is intact.     Deep Tendon Reflexes: Reflexes are normal and symmetric.     Reflex Scores:      Brachioradialis reflexes are 2+ on the right side and 2+ on the left side.      Patellar reflexes are 2+ on the right side and 2+ on the left side. Psychiatric:        Attention and Perception: Attention normal.        Mood and Affect: Mood normal.        Speech: Speech normal.        Behavior: Behavior normal. Behavior is cooperative.        Thought Content: Thought content normal.        Cognition and Memory: Cognition normal.        Judgment: Judgment normal.   EKG My review and personal interpretation at Time: 1050   Indication: chest pain Rate: 90 Rhythm: sinus Axis: normal Other: possible old inferior infarct and RVH  Results for orders placed or performed in visit on 12/28/20  Comprehensive metabolic panel  Result Value Ref Range   Glucose 100 (H) 65 - 99 mg/dL   BUN 17 8 - 27 mg/dL   Creatinine, Ser 0.98 0.76 - 1.27 mg/dL   eGFR 82 >59 mL/min/1.73   BUN/Creatinine Ratio 17 10 - 24   Sodium 140 134 - 144 mmol/L   Potassium 4.2 3.5 - 5.2 mmol/L   Chloride 102 96 - 106 mmol/L   CO2 26 20 - 29 mmol/L   Calcium 9.3 8.6 - 10.2 mg/dL   Total Protein 6.8 6.0 - 8.5 g/dL   Albumin 4.5 3.7 - 4.7 g/dL   Globulin, Total 2.3 1.5 - 4.5 g/dL   Albumin/Globulin Ratio 2.0 1.2 - 2.2   Bilirubin Total 0.3 0.0 - 1.2 mg/dL   Alkaline Phosphatase 116 44 - 121 IU/L   AST 16 0 - 40 IU/L   ALT 19 0 - 44 IU/L  Lipid Panel w/o Chol/HDL Ratio  Result Value Ref Range   Cholesterol, Total 123 100 - 199 mg/dL   Triglycerides 192 (H) 0 - 149 mg/dL   HDL 29 (L) >39 mg/dL   VLDL Cholesterol Cal 32 5 - 40 mg/dL   LDL Chol Calc (NIH) 62 0 - 99 mg/dL      Assessment & Plan:   Problem List Items Addressed This Visit       Cardiovascular and Mediastinum   Aortic atherosclerosis  ([place])    Noted on February 2021 CT screening, continue statin daily and recommend daily Baby ASA.  Recommend complete cessation smoking for prevention.      Relevant Orders   Comprehensive metabolic panel   Lipid Panel w/o Chol/HDL Ratio   Hypertension    Chronic, stable with BP at goal today.  Recommend he start checking BP at home three mornings a week and documenting for provider.  Discussed goal BP  with him.  Continue current medication regimen and adjust as needed.  Return in 6 months.  CMP, CBC, TSH, urine ALB today.  Refills sent.      Relevant Orders   Comprehensive metabolic panel   TSH     Respiratory   Centrilobular emphysema (HCC) - Primary    Chronic, stable without current maintenance medications.  Spirometry in February 2021 noting FEV1 71% and FEV1/FVC 70% pre today levels with slight downward trend FEV1 51% and FEV1/FVC 90% pre.  Continue to recommend complete smoking cessation.  Will start Stiolto daily and continue Albuterol as needed, provided sample of Stiolto in office today.  Educated him at length on emphysema and progressive nature of disease.   Continue annual lung screening.  Recommend complete cessation of smoking.  Return in 6 weeks.      Relevant Medications   Tiotropium Bromide-Olodaterol 2.5-2.5 MCG/ACT AERS   Other Relevant Orders   Spirometry with graph (Completed)   CBC with Differential/Platelet     Other   Chest pain    Intermittent episodes over past weeks, higher risk patient with abnormal EKG findings.  Reviewed with patient.  Referral to cardiology and Echo ordered.  Would benefit further work-up, appreciate all input.      Relevant Orders   EKG 12-Lead (Completed)   Ambulatory referral to Cardiology   ECHOCARDIOGRAM COMPLETE   Hyperlipidemia    Chronic, ongoing.  Continue current medication regimen and adjust as needed.  Lipid panel today.   Return in 6 months.      Relevant Orders   Comprehensive metabolic panel   Lipid Panel w/o  Chol/HDL Ratio   Nicotine dependence, cigarettes, uncomplicated    I have recommended complete cessation of tobacco use. I have discussed various options available for assistance with tobacco cessation including over the counter methods (Nicotine gum, patch and lozenges). We also discussed prescription options (Chantix, Nicotine Inhaler / Nasal Spray). The patient is not interested in pursuing any prescription tobacco cessation options at this time.       Pulmonary nodule    Noted x 1 nodule 2 mm left upper lobe recent lung screening, non worrisome on report.  Continue annual lung screening and discussed with patient.  Recommend complete smoking cessation.      Other Visit Diagnoses     Vitamin D deficiency       Reports history of low levels, check today and start supplement as needed.   Relevant Orders   VITAMIN D 25 Hydroxy (Vit-D Deficiency, Fractures)   Encounter for annual physical exam       Annual physical with labs today and health maintenance reviewed, refuses vaccinations today.       Discussed aspirin prophylaxis for myocardial infarction prevention and decision was made to start ASA  LABORATORY TESTING:  Health maintenance labs ordered today as discussed above.   The natural history of prostate cancer and ongoing controversy regarding screening and potential treatment outcomes of prostate cancer has been discussed with the patient. The meaning of a false positive PSA and a false negative PSA has been discussed. He indicates understanding of the limitations of this screening test and wishes to proceed with screening PSA testing.  He is aware of guideline recommendations, stopping at age 19, he agrees with this plan.   IMMUNIZATIONS:   - Tdap: Tetanus vaccination status reviewed: refuses. - Influenza: Refused - Pneumovax: Refused - Prevnar: Refused - Zostavax vaccine: Refused  SCREENING: - Colonoscopy: Up to date  Discussed with patient  purpose of the colonoscopy is  to detect colon cancer at curable precancerous or early stages   - AAA Screening: Refused  -Hearing Test: Not applicable  -Spirometry: Done today  PATIENT COUNSELING:    Sexuality: Discussed sexually transmitted diseases, partner selection, use of condoms, avoidance of unintended pregnancy  and contraceptive alternatives.   Advised to avoid cigarette smoking.  I discussed with the patient that most people either abstain from alcohol or drink within safe limits (<=14/week and <=4 drinks/occasion for males, <=7/weeks and <= 3 drinks/occasion for females) and that the risk for alcohol disorders and other health effects rises proportionally with the number of drinks per week and how often a drinker exceeds daily limits.  Discussed cessation/primary prevention of drug use and availability of treatment for abuse.   Diet: Encouraged to adjust caloric intake to maintain  or achieve ideal body weight, to reduce intake of dietary saturated fat and total fat, to limit sodium intake by avoiding high sodium foods and not adding table salt, and to maintain adequate dietary potassium and calcium preferably from fresh fruits, vegetables, and low-fat dairy products.    Stressed the importance of regular exercise  Injury prevention: Discussed safety belts, safety helmets, smoke detector, smoking near bedding or upholstery.   Dental health: Discussed importance of regular tooth brushing, flossing, and dental visits.   Follow up plan: NEXT PREVENTATIVE PHYSICAL DUE IN 1 YEAR. Return in about 6 weeks (around 08/11/2021) for Chest pain and COPD -- stiolto added.

## 2021-07-01 ENCOUNTER — Telehealth: Payer: Self-pay | Admitting: Nurse Practitioner

## 2021-07-01 ENCOUNTER — Encounter: Payer: Self-pay | Admitting: Nurse Practitioner

## 2021-07-01 DIAGNOSIS — E559 Vitamin D deficiency, unspecified: Secondary | ICD-10-CM | POA: Insufficient documentation

## 2021-07-01 DIAGNOSIS — J432 Centrilobular emphysema: Secondary | ICD-10-CM

## 2021-07-01 LAB — CBC WITH DIFFERENTIAL/PLATELET
Basophils Absolute: 0.1 10*3/uL (ref 0.0–0.2)
Basos: 1 %
EOS (ABSOLUTE): 0.2 10*3/uL (ref 0.0–0.4)
Eos: 2 %
Hematocrit: 43.1 % (ref 37.5–51.0)
Hemoglobin: 14.5 g/dL (ref 13.0–17.7)
Immature Grans (Abs): 0 10*3/uL (ref 0.0–0.1)
Immature Granulocytes: 0 %
Lymphocytes Absolute: 2.9 10*3/uL (ref 0.7–3.1)
Lymphs: 32 %
MCH: 29.5 pg (ref 26.6–33.0)
MCHC: 33.6 g/dL (ref 31.5–35.7)
MCV: 88 fL (ref 79–97)
Monocytes Absolute: 0.7 10*3/uL (ref 0.1–0.9)
Monocytes: 8 %
Neutrophils Absolute: 5.3 10*3/uL (ref 1.4–7.0)
Neutrophils: 57 %
Platelets: 222 10*3/uL (ref 150–450)
RBC: 4.92 x10E6/uL (ref 4.14–5.80)
RDW: 12.9 % (ref 11.6–15.4)
WBC: 9.2 10*3/uL (ref 3.4–10.8)

## 2021-07-01 LAB — COMPREHENSIVE METABOLIC PANEL
ALT: 18 IU/L (ref 0–44)
AST: 18 IU/L (ref 0–40)
Albumin/Globulin Ratio: 1.8 (ref 1.2–2.2)
Albumin: 4.5 g/dL (ref 3.7–4.7)
Alkaline Phosphatase: 110 IU/L (ref 44–121)
BUN/Creatinine Ratio: 16 (ref 10–24)
BUN: 17 mg/dL (ref 8–27)
Bilirubin Total: 0.4 mg/dL (ref 0.0–1.2)
CO2: 28 mmol/L (ref 20–29)
Calcium: 9.8 mg/dL (ref 8.6–10.2)
Chloride: 98 mmol/L (ref 96–106)
Creatinine, Ser: 1.06 mg/dL (ref 0.76–1.27)
Globulin, Total: 2.5 g/dL (ref 1.5–4.5)
Glucose: 86 mg/dL (ref 70–99)
Potassium: 3.9 mmol/L (ref 3.5–5.2)
Sodium: 137 mmol/L (ref 134–144)
Total Protein: 7 g/dL (ref 6.0–8.5)
eGFR: 74 mL/min/{1.73_m2} (ref >59)

## 2021-07-01 LAB — LIPID PANEL W/O CHOL/HDL RATIO
Cholesterol, Total: 145 mg/dL (ref 100–199)
HDL: 29 mg/dL — ABNORMAL LOW (ref >39)
LDL Chol Calc (NIH): 71 mg/dL (ref 0–99)
Triglycerides: 280 mg/dL — ABNORMAL HIGH (ref 0–149)
VLDL Cholesterol Cal: 45 mg/dL — ABNORMAL HIGH (ref 5–40)

## 2021-07-01 LAB — TSH: TSH: 2.6 u[IU]/mL (ref 0.450–4.500)

## 2021-07-01 LAB — VITAMIN D 25 HYDROXY (VIT D DEFICIENCY, FRACTURES): Vit D, 25-Hydroxy: 21.4 ng/mL — ABNORMAL LOW (ref 30.0–100.0)

## 2021-07-01 NOTE — Telephone Encounter (Signed)
Patient came in the office and asked to speak with provider regarding medication.  I advised patient that I would send a request to the provider.  He states that he can not afford the medication that was sent and is requesting a different medication.  Please contact patient at [phone] with any other questions.  Tiotropium Bromide-Olodaterol 2.5-2.5 MCG/ACT AERS [[id number]]

## 2021-07-01 NOTE — Progress Notes (Signed)
Good afternoon crew, please let Brandon Hart know his labs have returned: - Cholesterol labs stable, continue Atorvastatin daily. - Vitamin D level a little low, please start taking Vitamin D3 2000 units daily for overall bone and muscle health. - CBC shows no anemia or infection. - Kidney function, creatinine and eGFR, remains normal, as is liver function, AST and ALT.   - Thyroid level normal. Please ensure to schedule with cardiology when they call.  Any questions? Keep being amazing!!  Thank you for allowing me to participate in your care.  I appreciate you. Kindest regards, Beauty Pless

## 2021-07-02 ENCOUNTER — Telehealth: Payer: Self-pay

## 2021-07-02 MED ORDER — UMECLIDINIUM-VILANTEROL 62.5-25 MCG/ACT IN AEPB: 1.0000 | INHALATION_SPRAY | Freq: Every day | RESPIRATORY_TRACT | 4 refills | Status: DC

## 2021-07-02 NOTE — Chronic Care Management (AMB) (Signed)
Chronic Care Management   Note  07/02/2021 Name: Brandon Hart MRN: [id number] DOB: December 16, 1945  Brandon Hart is a 76 y.o. year old male who is a primary care patient of [name], [name], NP. I reached out to Brandon Hart by phone today in response to a referral sent by Brandon Hart's PCP.  Brandon Hart was given information about Chronic Care Management services today including:  CCM service includes personalized support from designated clinical staff supervised by his physician, including individualized plan of care and coordination with other care providers 24/7 contact phone numbers for assistance for urgent and routine care needs. Service will only be billed when office clinical staff spend 20 minutes or more in a month to coordinate care. Only one practitioner may furnish and bill the service in a calendar month. The patient may stop CCM services at any time (effective at the end of the month) by phone call to the office staff. The patient is responsible for co-pay (up to 20% after annual deductible is met) if co-pay is required by the individual health plan.   Patient agreed to services and verbal consent obtained.   Follow up plan: Telephone appointment with care management team member scheduled for:07/19/2021  Noreene Larsson, [place], [place], [place] [address] Direct Dial: [phone] [email or web address]: [email or web address]

## 2021-07-02 NOTE — Addendum Note (Signed)
Addended by: Marnee Guarneri T on: 07/02/2021 11:56 AM   Modules accepted: Orders

## 2021-07-05 NOTE — Telephone Encounter (Signed)
Spoke with patient and made him aware of Jolene's recommendations. Patient states he will go to his local pharmacy and pick up his medication. Patient was advised to give our office a call back if he has any concerns. Patient verbalized understanding.

## 2021-07-07 ENCOUNTER — Other Ambulatory Visit: Payer: Self-pay | Admitting: *Deleted

## 2021-07-07 DIAGNOSIS — F17210 Nicotine dependence, cigarettes, uncomplicated: Secondary | ICD-10-CM

## 2021-07-07 DIAGNOSIS — Z87891 Personal history of nicotine dependence: Secondary | ICD-10-CM

## 2021-07-08 ENCOUNTER — Ambulatory Visit (INDEPENDENT_AMBULATORY_CARE_PROVIDER_SITE_OTHER): Payer: Medicare Other

## 2021-07-08 ENCOUNTER — Other Ambulatory Visit: Payer: Self-pay

## 2021-07-08 DIAGNOSIS — R079 Chest pain, unspecified: Secondary | ICD-10-CM | POA: Diagnosis not present

## 2021-07-08 LAB — ECHOCARDIOGRAM COMPLETE
Area-P 1/2: 3.27 cm2
Calc EF: 53.8 %
S' Lateral: 3 cm
Single Plane A2C EF: 49.6 %
Single Plane A4C EF: 56.9 %

## 2021-07-08 NOTE — Progress Notes (Signed)
Good morning, please let Brandon Hart know his echo has returned and overall the heart's pumping well with valves all appearing intact.  I do recommend he schedule with cardiology still.  I would recommend he reach out to [facility] who referral went to and schedule a visit at [phone].  Any questions?

## 2021-07-09 ENCOUNTER — Ambulatory Visit: Payer: Medicare Other

## 2021-07-12 ENCOUNTER — Ambulatory Visit: Payer: Medicare Other

## 2021-07-16 ENCOUNTER — Telehealth: Payer: Self-pay

## 2021-07-16 NOTE — Chronic Care Management (AMB) (Signed)
? ? ?  Chronic Care Management ?Pharmacy Assistant  ? ?Name: Brandon Hart  MRN: [id number] DOB: [birth year] ? ?Brandon Hart is an 76 y.o. year old male who presents for his initial CCM visit with the clinical pharmacist. ? ?Recent office visits:  ?06/30/21-Jolene T. Ned Card, NP (PCP) Seen for annual exam. Labs ordered.  Will start Stiolto daily and continue Albuterol as needed, provided sample of Stiolto in office today. EKG completed. Ambulatory referral to Cardiology. Follow up in 6 weeks. ? ?Recent consult visits:  ?09/20/2-Alexia M. Hernandez-Soria (Orthopedic surgery) Notes not available.  ?01/22/21-Jose Piscoya, MD (General surgery) Seen for right facial sebaceous cyst. Return if symptoms worsen or fail to improve. ? ?Hospital visits:  ?None in previous 6 months ? ?Medications: ?Outpatient Encounter Medications as of 07/16/2021  ?Medication Sig  ? albuterol (PROAIR HFA) 108 (90 Base) MCG/ACT inhaler Inhale 2 puffs into the lungs every 6 (six) hours as needed for wheezing or shortness of breath.  ? amLODipine (NORVASC) 5 MG tablet Take 1 tablet (5 mg total) by mouth daily.  ? atorvastatin (LIPITOR) 20 MG tablet Take 1 tablet (20 mg total) by mouth daily.  ? lisinopril-hydrochlorothiazide (ZESTORETIC) 20-25 MG tablet Take 1 tablet by mouth daily.  ? umeclidinium-vilanterol (ANORO ELLIPTA) 62.5-25 MCG/ACT AEPB Inhale 1 puff into the lungs daily at 6 (six) AM.  ? ?No facility-administered encounter medications on file as of 07/16/2021.  ? ?Albuterol (PROAIR HFA) 108 (90 Base) MCG/ACT inhaler Last filled:12/24/19 25 DS ?AmLODipine (NORVASC) 5 MG tablet Last filled:06/30/21 90 DS ?Atorvastatin (LIPITOR) 20 MG tablet Last filled:04/19/21 90 DS ?Lisinopril-hydrochlorothiazide (ZESTORETIC) 20-25 MG tablet Last filled:06/30/21 90 DS ?Umeclidinium-vilanterol (ANORO ELLIPTA) 62.5-25 MCG/ACT AEPB Last filled:07/02/21 30 DS ? ?Care Gaps: ?None noted ? ?Star Rating Drugs: ?Atorvastatin 20 mg Last  filled:04/19/21 90 DS ?Lisinopril-hydrochlorothiazide 20-25 mg Last filled:06/30/21 90 DS ? ?Corrie Mckusick, RMA ?Health Concierge ? ?

## 2021-07-19 ENCOUNTER — Ambulatory Visit (INDEPENDENT_AMBULATORY_CARE_PROVIDER_SITE_OTHER): Payer: Medicare Other

## 2021-07-19 DIAGNOSIS — J432 Centrilobular emphysema: Secondary | ICD-10-CM

## 2021-07-19 DIAGNOSIS — I10 Essential (primary) hypertension: Secondary | ICD-10-CM

## 2021-07-19 DIAGNOSIS — E781 Pure hyperglyceridemia: Secondary | ICD-10-CM

## 2021-07-19 NOTE — Progress Notes (Unsigned)
Chronic Care Management Pharmacy Note  07/19/2021 Name:  Brandon Hart MRN:  [id number] DOB:  [birth year]  Summary: Does not qualify for anoro patient assistance Will send papers for stiolto PAP 10-yr ascvd 26% - goal of 50% LDL-C reduction  Smoker, HLD, HTN; peristent TG elevations Consider atorvastatin increase from 20 mg to 40 mg d/t high risk; baseline ldl 128, last 71   Subjective: Brandon Hart is an 76 y.o. year old male who is a primary patient of [name], [name], NP.  The CCM team was consulted for assistance with disease management and care coordination needs.    Engaged with patient by telephone for initial visit in response to provider referral for pharmacy case management and/or care coordination services.   Consent to Services:  The patient was given information about Chronic Care Management services, agreed to services, and gave verbal consent prior to initiation of services.  Please see initial visit note for detailed documentation.   Patient Care Team: Venita Lick, NP as PCP - General (Nurse Practitioner) Madelin Rear, [facility] (Pharmacist)  Hospital visits: None in previous 6 months  Objective:  Lab Results  Component Value Date   CREATININE 1.06 06/30/2021   CREATININE 0.98 12/28/2020   CREATININE 1.02 06/29/2020    No results found for: HGBA1C Last diabetic Eye exam: No results found for: HMDIABEYEEXA  Last diabetic Foot exam: No results found for: HMDIABFOOTEX      Component Value Date/Time   CHOL 145 06/30/2021 1053   CHOL 123 12/28/2020 1005   CHOL 140 06/29/2020 0945   CHOL 194 04/30/2015 0909   TRIG 280 (H) 06/30/2021 1053   TRIG 192 (H) 12/28/2020 1005   TRIG 202 (H) 06/29/2020 0945   TRIG 182 (H) 04/30/2015 0909   HDL 29 (L) 06/30/2021 1053   HDL 29 (L) 12/28/2020 1005   HDL 30 (L) 06/29/2020 0945   VLDL 36 (H) 04/30/2015 0909   LDLCALC 71 06/30/2021 1053   LDLCALC 62 12/28/2020 1005   LDLCALC 76 06/29/2020  0945    Hepatic Function Latest Ref Rng & Units 06/30/2021 12/28/2020 06/29/2020  Total Protein 6.0 - 8.5 g/dL 7.0 6.8 7.1  Albumin 3.7 - 4.7 g/dL 4.5 4.5 4.6  AST 0 - 40 IU/L _0 ALT 0 - 44 IU/L _1 Alk Phosphatase 44 - 121 IU/L 110 116 108  Total Bilirubin 0.0 - 1.2 mg/dL 0.4 0.3 0.4    Lab Results  Component Value Date/Time   TSH 2.600 06/30/2021 10:53 AM   TSH 2.360 06/29/2020 09:45 AM    CBC Latest Ref Rng & Units 06/30/2021 06/29/2020 07/19/2019  WBC 3.4 - 10.8 x10E3/uL 9.2 8.9 9.7  Hemoglobin 13.0 - 17.7 g/dL 14.5 14.5 13.7  Hematocrit 37.5 - 51.0 % 43.1 43.2 41.0  Platelets 150 - 450 x10E3/uL 222 202 254    Lab Results  Component Value Date/Time   VD25OH 21.4 (L) 06/30/2021 10:53 AM    Clinical ASCVD:  The 10-year ASCVD risk score (Arnett DK, et al., 2019) is: 26.4%   Values used to calculate the score:     Age: 76 years     Sex: Male     Is Non-Hispanic African American: No     Diabetic: No     Tobacco smoker: Yes     Systolic Blood Pressure: 846 mmHg     Is BP treated: Yes     HDL Cholesterol: 29 mg/dL     Total  Cholesterol: 145 mg/dL   Social History   Tobacco Use  Smoking Status Every Day   Packs/day: 0.50   Years: 50.00   Pack years: 25.00   Types: Cigarettes  Smokeless Tobacco Never   BP Readings from Last 3 Encounters:  06/30/21 117/76  01/22/21 117/75  01/15/21 108/74   Pulse Readings from Last 3 Encounters:  06/30/21 96  01/22/21 92  01/15/21 91   Wt Readings from Last 3 Encounters:  06/30/21 199 lb 12.8 oz (90.6 kg)  01/22/21 198 lb (89.8 kg)  01/15/21 197 lb (89.4 kg)   BMI Readings from Last 3 Encounters:  06/30/21 29.49 kg/m  01/22/21 29.24 kg/m  01/15/21 29.09 kg/m    Assessment: Review of patient past medical history, allergies, medications, health status, including review of consultants reports, laboratory and other test data, was performed as part of comprehensive evaluation and provision of chronic care  management services.   SDOH:  (Social Determinants of Health) assessments and interventions performed: Yes   CCM Care Plan  No Known Allergies  Medications Reviewed Today     Reviewed by Venita Lick, NP (Nurse Practitioner) on 06/30/21 at 1014  Med List Status: <None>   Medication Order Taking? Sig Documenting Provider Last Dose Status Informant  albuterol (PROAIR HFA) 108 (90 Base) MCG/ACT inhaler [id number] Yes Inhale 2 puffs into the lungs every 6 (six) hours as needed for wheezing or shortness of breath. Marnee Guarneri T, NP Taking Active   amLODipine (NORVASC) 5 MG tablet [id number] Yes Take 1 tablet (5 mg total) by mouth daily. Marnee Guarneri T, NP Taking Active   atorvastatin (LIPITOR) 20 MG tablet [id number] Yes Take 1 tablet (20 mg total) by mouth daily. Marnee Guarneri T, NP Taking Active   lisinopril-hydrochlorothiazide (ZESTORETIC) 20-25 MG tablet [id number] Yes Take 1 tablet by mouth daily. Venita Lick, NP Taking Active             Patient Active Problem List   Diagnosis Date Noted   Vitamin D deficiency 07/01/2021   Chest pain 06/30/2021   Epidermal cyst of face 12/28/2020   Aortic atherosclerosis ([place]) 07/11/2019   Pulmonary nodule 07/11/2019   Dupuytren's contracture 10/24/2017   Hyperlipidemia 07/14/2016   Advanced care planning/counseling discussion 07/13/2016   Insomnia 09/01/2015   Centrilobular emphysema ([place]) 05/27/2015   Nicotine dependence, cigarettes, uncomplicated 18/56/3149   Hypertension      There is no immunization history on file for this patient.  Conditions to be addressed/monitored: HLD HTN COPD Insomnia Nicotine dependence   There are no care plans that you recently modified to display for this patient.  Current Barriers:  Unable to independently afford treatment regimen Unable to independently monitor therapeutic efficacy  Pharmacist Clinical Goal(s):  Patient will verbalize ability to afford treatment regimen through  collaboration with PharmD and provider.   Interventions: 1:1 collaboration with Venita Lick, NP regarding development and update of comprehensive plan of care as evidenced by provider attestation and co-signature Inter-disciplinary care team collaboration (see longitudinal plan of care) Comprehensive medication review performed; medication list updated in electronic medical record  Hypertension (BP goal <130/80) -Controlled -Current treatment: Lisinopril-HCTZ (appropriate, safe, effective, accessible) -Medications previously tried: n/a  -Current home readings: none provided -Current exercise habits: no current exercise at present, is going to consider buying a bike and start to ride at least twice weekly as starting point -Denies hypotensive/hypertensive symptoms -Educated on BP goals and benefits of medications for prevention of heart attack, stroke and kidney damage;  Exercise goal of 150 minutes per week; Symptoms of hypotension and importance of maintaining adequate hydration; -Counseled to monitor BP at home as directed, document, and provide log at future appointments -Counseled on diet and exercise extensively Recommended to continue current medication Assessed for potential side effects - no problems noted  Hyperlipidemia: (LDL goal < 70) -Not ideally controlled -HTN, HLD, current smoker + persistent TG elevations -Current treatment: Atorvastatin 20 mg once daily  -Medications previously tried: n/a  -Current exercise habits: see HTN -Educated on Cholesterol goals;  Benefits of statin for ASCVD risk reduction; Exercise goal of 150 minutes per week; -Counseled on diet and exercise extensively Recommended to continue current medication Consider dose increase to   COPD (Goal: control symptoms and prevent exacerbations) -Controlled -Current treatment  Breo -Medications previously tried: stiolto (cost)  -Gold Grade: Gold 2 (FEV1 50-79%) -Current COPD Classification:   A (low sx, <2 exacerbations/yr) -CAT score: 6 -Pulmonary function testing: 2023 -Exacerbations requiring treatment in last 6 months: 0 -Patient reports consistent use of maintenance inhaler -Frequency of rescue inhaler use: n/a -Counseled on Proper inhaler technique; Benefits of consistent maintenance inhaler use -Assessed patient finances. Will send Stiolto PAP  Patient Goals/Self-Care Activities Patient will:  - take medications as prescribed as evidenced by patient report and record review collaborate with provider on medication access solutions  Medication Assistance: Application for Stiolto  medication assistance program. in process.  Anticipated assistance start date 08/2021.  See plan of care for additional detail.  Patient's preferred pharmacy is:  [place], [place] [facility] [address] [place] [address] Phone: [phone] Fax: [phone]   Pt endorses 100% compliance  Follow Up:  Patient agrees to Care Plan and Follow-up. Plan: HC send stiolto pap. Pharmacist call November 2023  Future Appointments  Date Time Provider [place]  07/20/2021  9:00 AM [facility]  07/21/2021  9:40 AM [facility]-CT [facility]  08/11/2021  9:40 AM Rockey Situ, Kathlene November, MD [facility]  08/16/2021  8:00 AM Venita Lick, NP [facility]FP-[facility]    Madelin Rear, PharmD, [facility] Clinical Pharmacist  [facility]  [phone]

## 2021-07-20 ENCOUNTER — Ambulatory Visit (INDEPENDENT_AMBULATORY_CARE_PROVIDER_SITE_OTHER): Payer: Medicare Other | Admitting: *Deleted

## 2021-07-20 DIAGNOSIS — Z0000 Encounter for general adult medical examination without abnormal findings: Secondary | ICD-10-CM | POA: Diagnosis not present

## 2021-07-20 NOTE — Progress Notes (Signed)
Subjective:   Brandon Hart is a 76 y.o. male who presents for Medicare Annual/Subsequent preventive examination.  I connected with  [name] on 07/20/21 by a telephone enabled telemedicine application and verified that I am speaking with the correct person using two identifiers.   I discussed the limitations of evaluation and management by telemedicine. The patient expressed understanding and agreed to proceed.  Patient location: home  Provider location: Tele-Health not in office    Review of Systems     Cardiac Risk Factors include: advanced age (>49mn, >>73women);hypertension;male gender;sedentary lifestyle;obesity (BMI >30kg/m2)     Objective:    Today's Vitals   There is no height or weight on file to calculate BMI.  Advanced Directives 07/20/2021 07/06/2020 07/03/2019 07/14/2017 07/13/2016 04/11/2016 04/28/2015  Does Patient Have a Medical Advance Directive? No No No No No No No  Would patient like information on creating a medical advance directive? No - Patient declined - - No - Patient declined - No - Patient declined -    Current Medications (verified) Outpatient Encounter Medications as of 07/20/2021  Medication Sig   albuterol (PROAIR HFA) 108 (90 Base) MCG/ACT inhaler Inhale 2 puffs into the lungs every 6 (six) hours as needed for wheezing or shortness of breath.   amLODipine (NORVASC) 5 MG tablet Take 1 tablet (5 mg total) by mouth daily.   atorvastatin (LIPITOR) 20 MG tablet Take 1 tablet (20 mg total) by mouth daily.   lisinopril-hydrochlorothiazide (ZESTORETIC) 20-25 MG tablet Take 1 tablet by mouth daily.   umeclidinium-vilanterol (ANORO ELLIPTA) 62.5-25 MCG/ACT AEPB Inhale 1 puff into the lungs daily at 6 (six) AM.   No facility-administered encounter medications on file as of 07/20/2021.    Allergies (verified) Patient has no known allergies.   History: Past Medical History:  Diagnosis Date   Hypertension    Past Surgical  History:  Procedure Laterality Date   COLONOSCOPY WITH PROPOFOL N/A 04/11/2016   Procedure: COLONOSCOPY WITH PROPOFOL;  Surgeon: KJonathon Bellows MD;  Location: [facility] ENDOSCOPY;  Service: Endoscopy;  Laterality: N/A;   HERNIA REPAIR     VASECTOMY     Family History  Problem Relation Age of Onset   Hypertension Mother    Breast cancer Mother    Hypertension Father    Colon cancer Father    Hypertension Brother    Hypertension Brother    Social History   Socioeconomic History   Marital status: Single    Spouse name: Not on file   Number of children: Not on file   Years of education: Not on file   Highest education level: Not on file  Occupational History   Occupation: retired  Tobacco Use   Smoking status: Every Day    Packs/day: 0.50    Years: 50.00    Pack years: 25.00    Types: Cigarettes   Smokeless tobacco: Never  Vaping Use   Vaping Use: Never used  Substance and Sexual Activity   Alcohol use: No    Comment: pt states he has not drink anything in about a year   Drug use: No   Sexual activity: Yes  Other Topics Concern   Not on file  Social History Narrative   Not on file   Social Determinants of Health   Financial Resource Strain: Low Risk    Difficulty of Paying Living Expenses: Not hard at all  Food Insecurity: No Food Insecurity   Worried About [place] the Last  Year: Never true   [place] in the Last Year: Never true  Transportation Needs: No Transportation Needs   Lack of Transportation (Medical): No   Lack of Transportation (Non-Medical): No  Physical Activity: Not on file  Stress: No Stress Concern Present   Feeling of Stress : Not at all  Social Connections: Unknown   Frequency of Communication with Friends and Family: Twice a week   Frequency of Social Gatherings with Friends and Family: Once a week   Attends Religious Services: More than 4 times per year   Active Member of [employer] or Organizations: No   Attends Programme researcher, broadcasting/film/video: Never   Marital Status: Not on file    Tobacco Counseling Ready to quit: Not Answered Counseling given: Not Answered   Clinical Intake:  Pre-visit preparation completed: Yes  Pain : No/denies pain     Nutritional Risks: None Diabetes: No  How often do you need to have someone help you when you read instructions, pamphlets, or other written materials from your doctor or pharmacy?: 1 - Never  Diabetic?   no  Interpreter Needed?: No  Information entered by :: Leroy Kennedy LPN   Activities of Daily Living In your present state of health, do you have any difficulty performing the following activities: 07/20/2021 06/30/2021  Hearing? N N  Vision? N N  Difficulty concentrating or making decisions? N N  Walking or climbing stairs? N N  Dressing or bathing? N N  Doing errands, shopping? N N  Preparing Food and eating ? N -  Using the Toilet? N -  In the past six months, have you accidently leaked urine? N -  Do you have problems with loss of bowel control? N -  Managing your Medications? N -  Managing your Finances? N -  Housekeeping or managing your Housekeeping? N -  Some recent data might be hidden    Patient Care Team: Venita Lick, NP as PCP - General (Nurse Practitioner) Madelin Rear, [facility] (Pharmacist)  Indicate any recent Medical Services you may have received from other than [facility] providers in the past year (date may be approximate).     Assessment:   This is a routine wellness examination for [facility].  Hearing/Vision screen Hearing Screening - Comments:: No trouble hearing Vision Screening - Comments:: Not up to date Had cataract surgery    several years ago  Dietary issues and exercise activities discussed: Current Exercise Habits: The patient does not participate in regular exercise at present, Intensity: Not Applicable   Goals Addressed             This Visit's Progress    Patient Stated       No goals     Quit smoking  / using tobacco         Depression Screen PHQ 2/9 Scores 07/20/2021 06/30/2021 07/06/2020 06/29/2020 06/19/2019 06/15/2018 07/14/2017  PHQ - 2 Score 0 2 0 0 0 0 2  PHQ- 9 Score 2 7 - 0 - 0 4    Fall Risk Fall Risk  07/20/2021 06/30/2021 01/04/2021 07/06/2020 06/29/2020  Falls in the past year? 0 0 0 0 0  Number falls in past yr: 0 0 0 - -  Injury with Fall? 0 0 0 - 0  Risk for fall due to : - No Fall Risks - Medication side effect -  Follow up Falls evaluation completed;Falls prevention discussed Falls prevention discussed - Falls evaluation completed;Education provided;Falls prevention discussed -  FALL RISK PREVENTION PERTAINING TO THE HOME:  Any stairs in or around the home? No  If so, are there any without handrails? No  Home free of loose throw rugs in walkways, pet beds, electrical cords, etc? Yes  Adequate lighting in your home to reduce risk of falls? Yes   ASSISTIVE DEVICES UTILIZED TO PREVENT FALLS:  Life alert? No  Use of a cane, walker or w/c? No  Grab bars in the bathroom? No  Shower chair or bench in shower? No  Elevated toilet seat or a handicapped toilet? No   TIMED UP AND GO:  Was the test performed? No .    Cognitive Function:     6CIT Screen 07/06/2020 07/03/2019 07/14/2017  What Year? 0 points 0 points 0 points  What month? 0 points 0 points 0 points  What time? 0 points 0 points 0 points  Count back from 20 0 points 0 points 0 points  Months in reverse 4 points 0 points 0 points  Repeat phrase 4 points 0 points 2 points  Total Score 8 0 2    Immunizations  There is no immunization history on file for this patient.  TDAP status: Due, Education has been provided regarding the importance of this vaccine. Advised may receive this vaccine at local pharmacy or Health Dept. Aware to provide a copy of the vaccination record if obtained from local pharmacy or Health Dept. Verbalized acceptance and understanding.  Flu Vaccine status: Declined, Education has been  provided regarding the importance of this vaccine but patient still declined. Advised may receive this vaccine at local pharmacy or Health Dept. Aware to provide a copy of the vaccination record if obtained from local pharmacy or Health Dept. Verbalized acceptance and understanding.  Pneumococcal vaccine status: Declined,  Education has been provided regarding the importance of this vaccine but patient still declined. Advised may receive this vaccine at local pharmacy or Health Dept. Aware to provide a copy of the vaccination record if obtained from local pharmacy or Health Dept. Verbalized acceptance and understanding.   Covid-19 vaccine status: Declined, Education has been provided regarding the importance of this vaccine but patient still declined. Advised may receive this vaccine at local pharmacy or Health Dept.or vaccine clinic. Aware to provide a copy of the vaccination record if obtained from local pharmacy or Health Dept. Verbalized acceptance and understanding.  Qualifies for Shingles Vaccine? No   Zostavax completed No   Shingrix Completed?: No.    Education has been provided regarding the importance of this vaccine. Patient has been advised to call insurance company to determine out of pocket expense if they have not yet received this vaccine. Advised may also receive vaccine at local pharmacy or Health Dept. Verbalized acceptance and understanding.  Screening Tests Health Maintenance  Topic Date Due   COVID-19 Vaccine (1) 08/05/2021 (Originally 07/05/1946)   INFLUENZA VACCINE  08/13/2021 (Originally 12/14/2020)   Zoster Vaccines- Shingrix (1 of 2) 09/27/2021 (Originally 01/02/1998)   Pneumonia Vaccine 29+ Years old (1 - PCV) 06/30/2022 (Originally 01/02/1952)   TETANUS/TDAP  06/30/2022 (Originally 01/03/1965)   COLONOSCOPY (Pts 45-23yr Insurance coverage will need to be confirmed)  04/11/2026   Hepatitis C Screening  Completed   HPV VACCINES  Aged Out    Health Maintenance  There are  no preventive care reminders to display for this patient.   Colorectal cancer screening: Type of screening: Colonoscopy. Completed 2017. Repeat every 10 years  Lung Cancer Screening: (Low Dose CT Chest recommended if Age  55-80 years, 30 pack-year currently smoking OR have quit w/in 15years.) does qualify.   Lung Cancer Screening Referral:  is scheduled for 07-21-2021    Additional Screening:  Hepatitis C Screening: does not qualify; Completed 2017  Vision Screening: Recommended annual ophthalmology exams for early detection of glaucoma and other disorders of the eye. Is the patient up to date with their annual eye exam?  No  Who is the provider or what is the name of the office in which the patient attends annual eye exams?  If pt is not established with a provider, would they like to be referred to a provider to establish care? No .   Dental Screening: Recommended annual dental exams for proper oral hygiene  Community Resource Referral / Chronic Care Management: CRR required this visit?  No   CCM required this visit?  No      Plan:     I have personally reviewed and noted the following in the patients chart:   Medical and social history Use of alcohol, tobacco or illicit drugs  Current medications and supplements including opioid prescriptions. Patient is not currently taking opioid prescriptions. Functional ability and status Nutritional status Physical activity Advanced directives List of other physicians Hospitalizations, surgeries, and ER visits in previous 12 months Vitals Screenings to include cognitive, depression, and falls Referrals and appointments  In addition, I have reviewed and discussed with patient certain preventive protocols, quality metrics, and best practice recommendations. A written personalized care plan for preventive services as well as general preventive health recommendations were provided to patient.     Leroy Kennedy, LPN   07/15/5496    Nurse Notes:

## 2021-07-20 NOTE — Patient Instructions (Signed)
Brandon Hart , Thank you for taking time to come for your Medicare Wellness Visit. I appreciate your ongoing commitment to your health goals. Please review the following plan we discussed and let me know if I can assist you in the future.   Screening recommendations/referrals: Colonoscopy: up to date Recommended yearly ophthalmology/optometry visit for glaucoma screening and checkup Recommended yearly dental visit for hygiene and checkup  Vaccinations: Influenza vaccine: Education provided Pneumococcal vaccine: Education provided Tdap vaccine: Education provided Shingles vaccine: Education provided    Advanced directives: Education provided  Conditions/risks identified:   Next appointment: 08-16-2021 @ 8:00  Brandon Hart  Preventive Care 10 Years and Older, Male Preventive care refers to lifestyle choices and visits with your health care provider that can promote health and wellness. What does preventive care include? A yearly physical exam. This is also called an annual well check. Dental exams once or twice a year. Routine eye exams. Ask your health care provider how often you should have your eyes checked. Personal lifestyle choices, including: Daily care of your teeth and gums. Regular physical activity. Eating a healthy diet. Avoiding tobacco and drug use. Limiting alcohol use. Practicing safe sex. Taking low doses of aspirin every day. Taking vitamin and mineral supplements as recommended by your health care provider. What happens during an annual well check? The services and screenings done by your health care provider during your annual well check will depend on your age, overall health, lifestyle risk factors, and family history of disease. Counseling  Your health care provider may ask you questions about your: Alcohol use. Tobacco use. Drug use. Emotional well-being. Home and relationship well-being. Sexual activity. Eating habits. History of falls. Memory and  ability to understand (cognition). Work and work Statistician. Screening  You may have the following tests or measurements: Height, weight, and BMI. Blood pressure. Lipid and cholesterol levels. These may be checked every 5 years, or more frequently if you are over 63 years old. Skin check. Lung cancer screening. You may have this screening every year starting at age 58 if you have a 30-pack-year history of smoking and currently smoke or have quit within the past 15 years. Fecal occult blood test (FOBT) of the stool. You may have this test every year starting at age 73. Flexible sigmoidoscopy or colonoscopy. You may have a sigmoidoscopy every 5 years or a colonoscopy every 10 years starting at age 25. Prostate cancer screening. Recommendations will vary depending on your family history and other risks. Hepatitis C blood test. Hepatitis B blood test. Sexually transmitted disease (STD) testing. Diabetes screening. This is done by checking your blood sugar (glucose) after you have not eaten for a while (fasting). You may have this done every 1-3 years. Abdominal aortic aneurysm (AAA) screening. You may need this if you are a current or former smoker. Osteoporosis. You may be screened starting at age 26 if you are at high risk. Talk with your health care provider about your test results, treatment options, and if necessary, the need for more tests. Vaccines  Your health care provider may recommend certain vaccines, such as: Influenza vaccine. This is recommended every year. Tetanus, diphtheria, and acellular pertussis (Tdap, Td) vaccine. You may need a Td booster every 10 years. Zoster vaccine. You may need this after age 69. Pneumococcal 13-valent conjugate (PCV13) vaccine. One dose is recommended after age 45. Pneumococcal polysaccharide (PPSV23) vaccine. One dose is recommended after age 54. Talk to your health care provider about which screenings and vaccines you need  and how often you need  them. This information is not intended to replace advice given to you by your health care provider. Make sure you discuss any questions you have with your health care provider. Document Released: 05/29/2015 Document Revised: 01/20/2016 Document Reviewed: 03/03/2015 Elsevier Interactive Patient Education  2017 [employer] Prevention in the Home Falls can cause injuries. They can happen to people of all ages. There are many things you can do to make your home safe and to help prevent falls. What can I do on the outside of my home? Regularly fix the edges of walkways and driveways and fix any cracks. Remove anything that might make you trip as you walk through a door, such as a raised step or threshold. Trim any bushes or trees on the path to your home. Use bright outdoor lighting. Clear any walking paths of anything that might make someone trip, such as rocks or tools. Regularly check to see if handrails are loose or broken. Make sure that both sides of any steps have handrails. Any raised decks and porches should have guardrails on the edges. Have any leaves, snow, or ice cleared regularly. Use sand or salt on walking paths during winter. Clean up any spills in your garage right away. This includes oil or grease spills. What can I do in the bathroom? Use night lights. Install grab bars by the toilet and in the tub and shower. Do not use towel bars as grab bars. Use non-skid mats or decals in the tub or shower. If you need to sit down in the shower, use a plastic, non-slip stool. Keep the floor dry. Clean up any water that spills on the floor as soon as it happens. Remove soap buildup in the tub or shower regularly. Attach bath mats securely with double-sided non-slip rug tape. Do not have throw rugs and other things on the floor that can make you trip. What can I do in the bedroom? Use night lights. Make sure that you have a light by your bed that is easy to reach. Do not use  any sheets or blankets that are too big for your bed. They should not hang down onto the floor. Have a firm chair that has side arms. You can use this for support while you get dressed. Do not have throw rugs and other things on the floor that can make you trip. What can I do in the kitchen? Clean up any spills right away. Avoid walking on wet floors. Keep items that you use a lot in easy-to-reach places. If you need to reach something above you, use a strong step stool that has a grab bar. Keep electrical cords out of the way. Do not use floor polish or wax that makes floors slippery. If you must use wax, use non-skid floor wax. Do not have throw rugs and other things on the floor that can make you trip. What can I do with my stairs? Do not leave any items on the stairs. Make sure that there are handrails on both sides of the stairs and use them. Fix handrails that are broken or loose. Make sure that handrails are as long as the stairways. Check any carpeting to make sure that it is firmly attached to the stairs. Fix any carpet that is loose or worn. Avoid having throw rugs at the top or bottom of the stairs. If you do have throw rugs, attach them to the floor with carpet tape. Make sure that you  have a light switch at the top of the stairs and the bottom of the stairs. If you do not have them, ask someone to add them for you. What else can I do to help prevent falls? Wear shoes that: Do not have high heels. Have rubber bottoms. Are comfortable and fit you well. Are closed at the toe. Do not wear sandals. If you use a stepladder: Make sure that it is fully opened. Do not climb a closed stepladder. Make sure that both sides of the stepladder are locked into place. Ask someone to hold it for you, if possible. Clearly mark and make sure that you can see: Any grab bars or handrails. First and last steps. Where the edge of each step is. Use tools that help you move around (mobility aids)  if they are needed. These include: Canes. Walkers. Scooters. Crutches. Turn on the lights when you go into a dark area. Replace any light bulbs as soon as they burn out. Set up your furniture so you have a clear path. Avoid moving your furniture around. If any of your floors are uneven, fix them. If there are any pets around you, be aware of where they are. Review your medicines with your doctor. Some medicines can make you feel dizzy. This can increase your chance of falling. Ask your doctor what other things that you can do to help prevent falls. This information is not intended to replace advice given to you by your health care provider. Make sure you discuss any questions you have with your health care provider. Document Released: 02/26/2009 Document Revised: 10/08/2015 Document Reviewed: 06/06/2014 Elsevier Interactive Patient Education  2017 [employer].

## 2021-07-21 ENCOUNTER — Other Ambulatory Visit: Payer: Self-pay

## 2021-07-21 ENCOUNTER — Ambulatory Visit
Admission: RE | Admit: 2021-07-21 | Discharge: 2021-07-21 | Disposition: A | Discharge status: Home / Self Care | Payer: Medicare Other | Source: Ambulatory Visit | Attending: Acute Care | Admitting: Acute Care

## 2021-07-21 DIAGNOSIS — F17210 Nicotine dependence, cigarettes, uncomplicated: Secondary | ICD-10-CM | POA: Diagnosis not present

## 2021-07-21 DIAGNOSIS — Z87891 Personal history of nicotine dependence: Secondary | ICD-10-CM | POA: Insufficient documentation

## 2021-07-22 ENCOUNTER — Other Ambulatory Visit: Payer: Self-pay | Admitting: Acute Care

## 2021-07-22 DIAGNOSIS — F17210 Nicotine dependence, cigarettes, uncomplicated: Secondary | ICD-10-CM

## 2021-07-22 DIAGNOSIS — Z87891 Personal history of nicotine dependence: Secondary | ICD-10-CM

## 2021-07-28 NOTE — Patient Instructions (Addendum)
Brandon Hart, ? ?Thank you for talking with me today. I have included our care plan/goals in the following pages.  ? ?Please review and call me at [phone] with any questions. ? ?Thanks! ?Edison Nasuti  ? ?Madelin Rear, PharmD ?Clinical Pharmacist  ?[phone] ? ?Care Plan : ccm pharmacy care plan  ?Updates made by Madelin Rear, [facility] since 07/28/2021 12:00 AM  ?  ? ?Problem: HLD HTN COPD Insomnia Nicotine dependence   ?  ? ?Long-Range Goal: disease management   ?Start Date: 07/19/2021  ?Expected End Date: 07/20/2022  ?This Visit's Progress: On track  ?Priority: High  ?Note:   ? ? ?There are no care plans that you recently modified to display for this patient. ? ?Current Barriers:  ?Unable to independently afford treatment regimen ?Unable to independently monitor therapeutic efficacy ? ?Pharmacist Clinical Goal(s):  ?Patient will verbalize ability to afford treatment regimen through collaboration with PharmD and provider.  ? ?Interventions: ?1:1 collaboration with Venita Lick, NP regarding development and update of comprehensive plan of care as evidenced by provider attestation and co-signature ?Inter-disciplinary care team collaboration (see longitudinal plan of care) ?Comprehensive medication review performed; medication list updated in electronic medical record ? ?Hypertension (BP goal <130/80) ?-Controlled ?-Current treatment: ?Lisinopril-HCTZ  ?appropriate, safe, effective, accessible ?-Medications previously tried: n/a  ?-Current home readings: none provided ?-Current exercise habits: no current exercise at present, is going to consider buying a bike and start to ride at least twice weekly as starting point ?-Denies hypotensive/hypertensive symptoms ?-Educated on BP goals and benefits of medications for prevention of heart attack, stroke and kidney damage; ?Exercise goal of 150 minutes per week; ?Symptoms of hypotension and importance of maintaining adequate hydration; ?-Counseled to monitor BP at home as  directed, document, and provide log at future appointments ?-Counseled on diet and exercise extensively ?Recommended to continue current medication ?Assessed for potential side effects - no problems noted ? ?Hyperlipidemia: (LDL goal < 70) ?-Not ideally controlled ?-HTN, HLD, current smoker + persistent TG elevations ?-Current treatment: ?Atorvastatin 20 mg once daily  ?appropriate, safe, query effective ?-Medications previously tried: n/a  ?-Current exercise habits: see HTN ?-Educated on Cholesterol goals;  ?Benefits of statin for ASCVD risk reduction; ?Exercise goal of 150 minutes per week; ?-Counseled on diet and exercise extensively ?Recommended to continue current medication ?Consider dose increase to atorvastatin 40 mg daily  ? ?COPD (Goal: control symptoms and prevent exacerbations) ?-Controlled ?-Current treatment  ?Anoro Ellipta  ?appropriate, safe, effective, query accessible ?-Medications previously tried: stiolto (cost)  ?-Gold Grade: Gold 2 (FEV1 50-79%) ?-Current COPD Classification:  A (low sx, <2 exacerbations/yr) ?-CAT score: 6 ?-Pulmonary function testing: 2023 ?-Exacerbations requiring treatment in last 6 months: 0 ?-Patient reports consistent use of maintenance inhaler ?-Frequency of rescue inhaler use: n/a ?-Counseled on Proper inhaler technique; ?Benefits of consistent maintenance inhaler use ?-Assessed patient finances. Will send Stiolto PAP. Does not qualify for PAP on Anoro ? ?Patient Goals/Self-Care Activities ?Patient will:  ?- take medications as prescribed as evidenced by patient report and record review ?collaborate with provider on medication access solutions ? ?Medication Assistance: Application for Stiolto  medication assistance program. in process.  Anticipated assistance start date 08/2021.  See plan of care for additional detail. ?  ? ? ?The patient was given the following information about Chronic Care Management services today, agreed to services, and gave verbal consent: 1. CCM  service includes personalized support from designated clinical staff supervised by the primary care provider, including individualized plan of care and coordination  with other care providers 2. 24/7 contact phone numbers for assistance for urgent and routine care needs. 3. Service will only be billed when office clinical staff spend 20 minutes or more in a month to coordinate care. 4. Only one practitioner may furnish and bill the service in a calendar month. 5.The patient may stop CCM services at any time (effective at the end of the month) by phone call to the office staff. 6. The patient will be responsible for cost sharing (co-pay) of up to 20% of the service fee (after annual deductible is met). Patient agreed to services and consent obtained. ? ?The patient verbalized understanding of instructions provided today and declined to receive a copy of patient instruction and/or educational materials. ?Telephone follow up appointment with pharmacy team member scheduled for: See next appointment with "Care Management Staff" under "What's Next" below.   ?

## 2021-08-02 ENCOUNTER — Other Ambulatory Visit: Payer: Self-pay | Admitting: Nurse Practitioner

## 2021-08-02 NOTE — Telephone Encounter (Signed)
Requested Prescriptions  ?Pending Prescriptions Disp Refills  ?? atorvastatin (LIPITOR) 20 MG tablet [Pharmacy Med Name: ATORVASTATIN 20 MG TABLET] 90 tablet 0  ?  Sig: Take 1 tablet (20 mg total) by mouth daily.  ?  ? Cardiovascular:  Antilipid - Statins Failed - 08/02/2021  7:44 AM  ?  ?  Failed - Lipid Panel in normal range within the last 12 months  ?  Cholesterol, Total  ?Date Value Ref Range Status  ?06/30/2021 145 100 - 199 mg/dL Final  ? ?Cholesterol Piccolo, [place]  ?Date Value Ref Range Status  ?04/30/2015 194 <200 mg/dL Final  ?  Comment:  ?                          Desirable                <200 ?                        Borderline High      200- 239 ?                        High                     >239 ?  ? ?LDL Chol Calc (NIH)  ?Date Value Ref Range Status  ?06/30/2021 71 0 - 99 mg/dL Final  ? ?HDL  ?Date Value Ref Range Status  ?06/30/2021 29 (L) >39 mg/dL Final  ? ?Triglycerides  ?Date Value Ref Range Status  ?06/30/2021 280 (H) 0 - 149 mg/dL Final  ? ?Triglycerides Piccolo,Waived  ?Date Value Ref Range Status  ?04/30/2015 182 (H) <150 mg/dL Final  ?  Comment:  ?                          Normal                   <150 ?                        Borderline High     150 - 199 ?                        High                200 - 499 ?                        Very High                >499 ?  ? ?  ?  ?  Passed - Patient is not pregnant  ?  ?  Passed - Valid encounter within last 12 months  ?  Recent Outpatient Visits   ?      ? 1 month ago Centrilobular emphysema ([place])  ? [place]wa Gentry, [place] T, NP  ? 7 months ago Centrilobular emphysema ([place])  ? [place], [place] T, NP  ? 1 year ago Centrilobular emphysema ([place])  ? Saunemin, Ackley T, NP  ? 1 year ago Centrilobular emphysema ([place])  ? [facility] Timpson, Henrine Screws T, NP  ? 2 years ago Annual physical exam  ? [facility] Venita Lick, NP  ?  ?  ?  Future Appointments   ?         ? In 1 week Gollan, Kathlene November, MD [facility]  ? In 2 weeks [name], [name], NP [employer], [facility]  ?  ? ?  ?  ?  ? ? ?

## 2021-08-11 ENCOUNTER — Encounter: Payer: Self-pay | Admitting: Cardiovascular Disease

## 2021-08-11 ENCOUNTER — Ambulatory Visit: Payer: Medicare Other | Admitting: Cardiovascular Disease

## 2021-08-11 ENCOUNTER — Other Ambulatory Visit: Payer: Self-pay

## 2021-08-11 VITALS — BP 118/74 | HR 84 | Ht 69.0 in | Wt 200.5 lb

## 2021-08-11 DIAGNOSIS — R079 Chest pain, unspecified: Secondary | ICD-10-CM | POA: Diagnosis not present

## 2021-08-11 DIAGNOSIS — E782 Mixed hyperlipidemia: Secondary | ICD-10-CM | POA: Diagnosis not present

## 2021-08-11 DIAGNOSIS — J432 Centrilobular emphysema: Secondary | ICD-10-CM | POA: Diagnosis not present

## 2021-08-11 DIAGNOSIS — I10 Essential (primary) hypertension: Secondary | ICD-10-CM | POA: Diagnosis not present

## 2021-08-11 DIAGNOSIS — I25118 Atherosclerotic heart disease of native coronary artery with other forms of angina pectoris: Secondary | ICD-10-CM

## 2021-08-11 DIAGNOSIS — I700 Atherosclerosis of aorta: Secondary | ICD-10-CM

## 2021-08-11 NOTE — Progress Notes (Signed)
Cardiology Office Note ? ?Date:  08/11/2021  ? ?ID:  Brandon Hart, DOB 20-Nov-1945, MRN [id number] ? ?PCP:  Brandon Lick, NP  ? ?Chief Complaint  ?Patient presents with  ? New Patient (Initial Visit)  ?  Ref by Brandon Guarneri, NP for chest pain & discuss recent Echo. Patient c/o shortness of breath and chest pain that comes and goes; symptoms for 2-3 months. Medications reviewed by the patient verbally.   ? ? ?HPI:  ?[name] is a 76 year old gentleman with history of ?Smoking, greater than 50 years, COPD ?Coronary artery calcification, aortic atherosclerosis ?Who presents by referral from primary care for chest pain, shortness of breath ? ?On his visit today he reports that he continues to smoke,  ?Reports having some chest pains on the left, some shortness of breath on exertion ? ?Sedentary, no hobbies, no regular exercise ? ?CT lung screening March 2023 images pulled up and reviewed ?Very mild aortic atherosclerosis, ?At least moderate coronary calcification three-vessel, most notably in the LAD ? ?EKG personally reviewed by myself on todays visit ?Normal sinus rhythm rate 84 bpm no significant ST-T wave changes ? ?Prior cardiac studies reviewed ?Echo 2/23 ? 1. Left ventricular ejection fraction, by estimation, is 55 to 60%. The  ?left ventricle has normal function. The left ventricle has no regional  ?wall motion abnormalities. Left ventricular diastolic parameters were  ?normal. The average left ventricular  ?global longitudinal strain is -17.8 %. The global longitudinal strain is  ?normal.  ? 2. Right ventricular systolic function is normal. The right ventricular  ?size is normal.  ? 3. The mitral valve is normal in structure. No evidence of mitral valve  ?regurgitation.  ? 4. The aortic valve is tricuspid. Aortic valve regurgitation is not  ?visualized.  ? 5. The inferior vena cava is normal in size with greater than 50%  ?respiratory variability, suggesting right atrial pressure of 3  mmHg.  ? ? ?PMH:   has a past medical history of Hypertension. ? ?PSH:    ?Past Surgical History:  ?Procedure Laterality Date  ? COLONOSCOPY WITH PROPOFOL N/A 04/11/2016  ? Procedure: COLONOSCOPY WITH PROPOFOL;  Surgeon: Brandon Bellows, MD;  Location: [facility] ENDOSCOPY;  Service: Endoscopy;  Laterality: N/A;  ? HERNIA REPAIR    ? VASECTOMY    ? ? ?Current Outpatient Medications  ?Medication Sig Dispense Refill  ? albuterol (PROAIR HFA) 108 (90 Base) MCG/ACT inhaler Inhale 2 puffs into the lungs every 6 (six) hours as needed for wheezing or shortness of breath. 18 g 4  ? amLODipine (NORVASC) 5 MG tablet Take 1 tablet (5 mg total) by mouth daily. 90 tablet 4  ? atorvastatin (LIPITOR) 20 MG tablet Take 1 tablet (20 mg total) by mouth daily. 90 tablet 0  ? lisinopril-hydrochlorothiazide (ZESTORETIC) 20-25 MG tablet Take 1 tablet by mouth daily. 90 tablet 4  ? umeclidinium-vilanterol (ANORO ELLIPTA) 62.5-25 MCG/ACT AEPB Inhale 1 puff into the lungs daily at 6 (six) AM. 60 each 4  ? ?No current facility-administered medications for this visit.  ? ? ? ?Allergies:   Patient has no known allergies.  ? ?Social History:  The patient  reports that he has been smoking cigarettes. He has a 25.00 pack-year smoking history. He has never used smokeless tobacco. He reports that he does not drink alcohol and does not use drugs.  ? ?Family History:   family history includes Breast cancer in his mother; Colon cancer in his father; Hypertension in his brother, brother, father,  and mother.  ? ? ?Review of Systems: ?Review of Systems  ?Constitutional: Negative.   ?HENT: Negative.    ?Respiratory:  Positive for shortness of breath.   ?Cardiovascular:  Positive for chest pain.  ?Gastrointestinal: Negative.   ?Musculoskeletal: Negative.   ?Neurological: Negative.   ?Psychiatric/Behavioral: Negative.    ?All other systems reviewed and are negative. ? ? ?PHYSICAL EXAM: ?VS:  BP 118/74 (BP Location: Right Arm, Patient Position: Sitting, Cuff Size:  Normal)   Pulse 84   Ht '5\' 9"'$  (1.753 m)   Wt 200 lb 8 oz (90.9 kg)   SpO2 98%   BMI 29.61 kg/m?  , BMI Body mass index is 29.61 kg/m?. ?GEN: Well nourished, well developed, in no acute distress ?HEENT: normal ?Neck: no JVD, carotid bruits, or masses ?Cardiac: RRR; no murmurs, rubs, or gallops,no edema  ?Respiratory: Moderately decreased breath sounds, otherwise clear, normal work of breathing ?GI: soft, nontender, nondistended, + BS ?MS: no deformity or atrophy ?Skin: warm and dry, no rash ?Neuro:  Strength and sensation are intact ?Psych: euthymic mood, full affect ? ?Recent Labs: ?06/30/2021: ALT 18; BUN 17; Creatinine, Ser 1.06; Hemoglobin 14.5; Platelets 222; Potassium 3.9; Sodium 137; TSH 2.600  ? ? ?Lipid Panel ?Lab Results  ?Component Value Date  ? CHOL 145 06/30/2021  ? HDL 29 (L) 06/30/2021  ? [place] 71 06/30/2021  ? TRIG 280 (H) 06/30/2021  ? ?  ? ?Wt Readings from Last 3 Encounters:  ?08/11/21 200 lb 8 oz (90.9 kg)  ?07/21/21 197 lb (89.4 kg)  ?06/30/21 199 lb 12.8 oz (90.6 kg)  ?  ? ?ASSESSMENT AND PLAN: ? ?Problem List Items Addressed This Visit   ? ?  ? Cardiology Problems  ? Hypertension  ? Hyperlipidemia  ? Aortic atherosclerosis ([place])  ?  ? Other  ? Centrilobular emphysema ([place])  ? Chest pain  ? Relevant Orders  ? EKG 12-Lead  ? NM Myocar Multi W/Spect W/Wall Motion / EF  ? ?Other Visit Diagnoses   ? ? Coronary artery disease of native artery of native heart with stable angina pectoris ([place])    -  Primary  ? ?  ? ? ?Coronary artery disease ?Heavy coronary calcification noted on CT scan chest, particularly LAD ?Sedentary, having shortness of breath and chest pain, unable to exclude ischemia.  Poor candidate for treadmill ?We have ordered Myoview for ischemic work-up ?Poor candidate for cardiac CTA given heavy calcification ? ?Emphysema/smoker ?We have encouraged him to continue to work on weaning his cigarettes and smoking cessation. He will continue to work on this and does not want any  assistance with chantix.   ? ?Hyperlipidemia ?Continue statin, goal LDL less than 70 ?Could consider adding Zetia ? ?Essential hypertension ?Blood pressure is well controlled on today's visit. No changes made to the medications. ? ? ? Total encounter time more than 60 minutes ? Greater than 50% was spent in counseling and coordination of care with the patient ? ? ? ?Signed, ?Esmond Plants, M.D., Ph.D. ?[facility] [place], [place] ?[phone] ?

## 2021-08-11 NOTE — Patient Instructions (Addendum)
Medication Instructions:  ?No changes ? ?If you need a refill on your cardiac medications before your next appointment, please call your pharmacy.  ? ?Lab work: ?No new labs needed ? ?Testing/Procedures: ? ?[facility] MYOVIEW ? ?Your caregiver has ordered a Stress Test with nuclear imaging. The purpose of this test is to evaluate the blood supply to your heart muscle. This procedure is referred to as a "Non-Invasive Stress Test." This is because other than having an IV started in your vein, nothing is inserted or "invades" your body. Cardiac stress tests are done to find areas of poor blood flow to the heart by determining the extent of coronary artery disease (CAD). Some patients exercise on a treadmill, which naturally increases the blood flow to your heart, while others who are  unable to walk on a treadmill due to physical limitations have a pharmacologic/chemical stress agent called Lexiscan . This medicine will mimic walking on a treadmill by temporarily increasing your coronary blood flow.  ? ?Please note: these test may take anywhere between 2-4 hours to complete ? ?PLEASE REPORT TO [facility] MEDICAL MALL ENTRANCE  ?THE VOLUNTEERS AT THE FIRST DESK WILL DIRECT YOU WHERE TO GO ? ?Date of Procedure:_____________________________________ ? ?Arrival Time for Procedure:______________________________ ? ? ?PLEASE NOTIFY THE OFFICE AT LEAST 24 HOURS IN ADVANCE IF YOU ARE UNABLE TO KEEP YOUR APPOINTMENT.  [phone] ?AND  ?PLEASE NOTIFY NUCLEAR MEDICINE AT [facility] AT LEAST 65 HOURS IN ADVANCE IF YOU ARE UNABLE TO KEEP YOUR APPOINTMENT. [phone] ? ?How to prepare for your Myoview test: ? ?Do not eat or drink after midnight ?No caffeine for 24 hours prior to test ?No smoking 24 hours prior to test. ?Your medication may be taken with water.  If your doctor stopped a medication because of this test, do not take that medication. ?Ladies, please do not wear dresses.  Skirts or pants are appropriate. Please wear a short sleeve  shirt. ?No perfume, cologne or lotion. ?Wear comfortable walking shoes. No heels! ? ? ?Follow-Up: ?At [facility], you and your health needs are our priority.  As part of our continuing mission to provide you with exceptional heart care, we have created designated Provider Care Teams.  These Care Teams include your primary Cardiologist (physician) and Advanced Practice Providers (APPs -  Physician Assistants and Nurse Practitioners) who all work together to provide you with the care you need, when you need it. ? ?You will need a follow up appointment  as needed ? ?Providers on your designated Care Team:   ?Murray Hodgkins, NP ?Christell Faith, PA-C ?Cadence Kathlen Mody, PA-C ? ?COVID-19 Vaccine Information can be found at: [email or web address] For questions related to vaccine distribution or appointments, please email [email or web address] or call [phone].  ? ?

## 2021-08-13 DIAGNOSIS — E785 Hyperlipidemia, unspecified: Secondary | ICD-10-CM

## 2021-08-13 DIAGNOSIS — I10 Essential (primary) hypertension: Secondary | ICD-10-CM

## 2021-08-13 DIAGNOSIS — J449 Chronic obstructive pulmonary disease, unspecified: Secondary | ICD-10-CM

## 2021-08-14 NOTE — Patient Instructions (Signed)

## 2021-08-16 ENCOUNTER — Ambulatory Visit (INDEPENDENT_AMBULATORY_CARE_PROVIDER_SITE_OTHER): Payer: Medicare Other | Admitting: Nurse Practitioner

## 2021-08-16 ENCOUNTER — Encounter: Payer: Self-pay | Admitting: Nurse Practitioner

## 2021-08-16 ENCOUNTER — Telehealth: Payer: Self-pay

## 2021-08-16 VITALS — BP 111/77 | HR 77 | Temp 97.9°F | Ht 69.0 in | Wt 199.0 lb

## 2021-08-16 DIAGNOSIS — I700 Atherosclerosis of aorta: Secondary | ICD-10-CM

## 2021-08-16 DIAGNOSIS — R079 Chest pain, unspecified: Secondary | ICD-10-CM | POA: Diagnosis not present

## 2021-08-16 DIAGNOSIS — J432 Centrilobular emphysema: Secondary | ICD-10-CM

## 2021-08-16 NOTE — Chronic Care Management (AMB) (Signed)
? ? ?  Chronic Care Management ?Pharmacy Assistant  ? ?Name: Brandon Hart  MRN: [id number] DOB: 1945-07-18 ? ?Reason for Encounter: Patient Assitance application for Stiolto ?  ? ?Medications: ?Outpatient Encounter Medications as of 08/16/2021  ?Medication Sig  ? albuterol (PROAIR HFA) 108 (90 Base) MCG/ACT inhaler Inhale 2 puffs into the lungs every 6 (six) hours as needed for wheezing or shortness of breath. (Patient not taking: Reported on 08/16/2021)  ? amLODipine (NORVASC) 5 MG tablet Take 1 tablet (5 mg total) by mouth daily.  ? atorvastatin (LIPITOR) 20 MG tablet Take 1 tablet (20 mg total) by mouth daily.  ? lisinopril-hydrochlorothiazide (ZESTORETIC) 20-25 MG tablet Take 1 tablet by mouth daily.  ? umeclidinium-vilanterol (ANORO ELLIPTA) 62.5-25 MCG/ACT AEPB Inhale 1 puff into the lungs daily at 6 (six) AM. (Patient not taking: Reported on 08/16/2021)  ? ?No facility-administered encounter medications on file as of 08/16/2021.  ? ? ?I have prefilled patient assistance application to patient on Stiolto. I have also contacted patient to complete the application where is it highlighted where patient needs to complete the form and to bring back once done. Patient understood.  ? ?Corrie Mckusick, RMA ?Health Concierge ? ?

## 2021-08-16 NOTE — Progress Notes (Signed)
? ?BP 111/77   Pulse 77   Temp 97.9 ?F (36.6 ?C) (Oral)   Ht '5\' 9"'$  (1.753 m)   Wt 199 lb (90.3 kg)   SpO2 97%   BMI 29.39 kg/m?   ? ?Subjective:  ? ? Patient ID: Brandon Hart, male    DOB: [birth year], 76 y.o.   MRN: [id number] ? ?HPI: ?Brandon Hart is a 76 y.o. male ? ?Chief Complaint  ?Patient presents with  ? COPD  ? Chest Pain  ?  Patient states he is getting better and has not had any chest pain since his last visit.   ? Medication Management  ?  Patient states he never heard back from anyone in regards to getting assistance to receive his inhaler. Patient states he never received the inhaler due to the cost of the prescription.   ? ?COPD ?Added on Stiolto at last visit, provided sample, but was never able to afford further as to costly -- $50.  Working with CCM team, but he reports he has not heard back from them on this application process, therefore has not been using any inhaler at this time.  Recent lung screening on 07/21/21 continues to show emphysema and aortic atherosclerosis. ? ?Continues to smoke daily, not interested in quitting. ?COPD status: stable ?Satisfied with current treatment?: yes ?Oxygen use: no ?Dyspnea frequency: none ?Cough frequency: occasionally ?Rescue inhaler frequency:  none ?Limitation of activity: no ?Productive cough: none ?Last Spirometry: FEV1 51% and FEV1/FVC 90% on 06/30/21 ?Pneumovax:  refused ?Influenza:  refused   ? ?CHEST PAIN ?Complained of last visit, was seen by cardiology on 08/11/21.  Echo on 07/08/21 = EF 55-60%.  Cardiology recommends Myo (going Friday) and possible Zetia in future.  He reports no further chest pain presenting since last visit with PCP. ?Shortness of breath: no ?Cough:  occasional with COPD ?Nausea: no ?Diaphoresis: no ?Heartburn: no ?Palpitations: no  ? ?Relevant past medical, surgical, family and social history reviewed and updated as indicated. Interim medical history since our last visit reviewed. ?Allergies and  medications reviewed and updated. ? ?Review of Systems  ?Constitutional:  Negative for activity change, diaphoresis, fatigue and fever.  ?Respiratory:  Positive for wheezing (intermittent). Negative for cough, chest tightness and shortness of breath.   ?Cardiovascular:  Negative for chest pain, palpitations and leg swelling.  ?Gastrointestinal: Negative.   ?Neurological: Negative.   ?Psychiatric/Behavioral: Negative.    ? ?Per HPI unless specifically indicated above ? ?   ?Objective:  ?  ?BP 111/77   Pulse 77   Temp 97.9 ?F (36.6 ?C) (Oral)   Ht '5\' 9"'$  (1.753 m)   Wt 199 lb (90.3 kg)   SpO2 97%   BMI 29.39 kg/m?   ?Wt Readings from Last 3 Encounters:  ?08/16/21 199 lb (90.3 kg)  ?08/11/21 200 lb 8 oz (90.9 kg)  ?07/21/21 197 lb (89.4 kg)  ?  ?Physical Exam ?Vitals and nursing note reviewed.  ?Constitutional:   ?   General: He is awake. He is not in acute distress. ?   Appearance: He is well-developed and well-groomed. He is not ill-appearing.  ?HENT:  ?   Head: Normocephalic and atraumatic.  ?   Right Ear: Hearing normal. No drainage.  ?   Left Ear: Hearing normal. No drainage.  ?Eyes:  ?   General: Lids are normal.     ?   Right eye: No discharge.     ?   Left eye: No discharge.  ?  Conjunctiva/sclera: Conjunctivae normal.  ?   Pupils: Pupils are equal, round, and reactive to light.  ?Neck:  ?   Thyroid: No thyromegaly.  ?   Vascular: No carotid bruit.  ?   Trachea: Trachea normal.  ?Cardiovascular:  ?   Rate and Rhythm: Normal rate and regular rhythm.  ?   Heart sounds: Normal heart sounds, S1 normal and S2 normal. No murmur heard. ?  No gallop.  ?Pulmonary:  ?   Effort: Pulmonary effort is normal. No accessory muscle usage or respiratory distress.  ?   Breath sounds: Normal breath sounds.  ?Abdominal:  ?   General: Bowel sounds are normal.  ?   Palpations: Abdomen is soft.  ?Musculoskeletal:     ?   General: Normal range of motion.  ?   Cervical back: Normal range of motion and neck supple.  ?   Right lower  leg: No edema.  ?   Left lower leg: No edema.  ?Skin: ?   General: Skin is warm and dry.  ?   Capillary Refill: Capillary refill takes less than 2 seconds.  ?   Findings: No rash.  ?Neurological:  ?   Mental Status: He is alert and oriented to person, place, and time.  ?   Deep Tendon Reflexes: Reflexes are normal and symmetric.  ?Psychiatric:     ?   Attention and Perception: Attention normal.     ?   Mood and Affect: Mood normal.     ?   Speech: Speech normal.     ?   Behavior: Behavior normal. Behavior is cooperative.     ?   Thought Content: Thought content normal.  ? ?Results for orders placed or performed in visit on 07/08/21  ?ECHOCARDIOGRAM COMPLETE  ?Result Value Ref Range  ? S' Lateral 3.00 cm  ? Area-P 1/2 3.27 cm2  ? Single Plane A4C EF 56.9 %  ? Single Plane A2C EF 49.6 %  ? Calc EF 53.8 %  ? ?   ?Assessment & Plan:  ? ?Problem List Items Addressed This Visit   ? ?  ? Cardiovascular and Mediastinum  ? Aortic atherosclerosis ([place])  ?  Noted on February 2021 CT screening, continue statin daily and recommend daily Baby ASA.  Recommend complete cessation smoking for prevention. ?  ?  ?  ? Respiratory  ? Centrilobular emphysema ([place]) - Primary  ?  Chronic, stable without current maintenance medications.  Spirometry recent February 2023 slight downward trend FEV1 51% and FEV1/FVC 90% pre.  Continue to recommend complete smoking cessation.  Will work on assist for [employer] daily (contacted CCM PharmD) and continue Albuterol as needed, no samples available of Stiolto in office today.  Educated him at length on emphysema and progressive nature of disease.   Continue annual lung screening.  Recommend complete cessation of smoking.  Highly recommend PCV13 vaccination, he refuses.  Return in 5 months for follow-up. ?  ?  ?  ? Other  ? Chest pain  ?  Acute with no further episodes, is scheduled for Myo on Friday. Recent visit with cardiology and no changes made.  At this time continue to monitor closely and he is  aware to immediately contact provider or go to ER if further CP episodes present. ?  ?  ?  ? ?Follow up plan: ?Return in about 5 months (around 01/16/2022) for COPD, HTN/HLD, SMOKER. ? ? ? ? ? ?

## 2021-08-16 NOTE — Assessment & Plan Note (Addendum)
Chronic, stable without current maintenance medications.  Spirometry recent February 2023 slight downward trend FEV1 51% and FEV1/FVC 90% pre.  Continue to recommend complete smoking cessation.  Will work on assist for [employer] daily (contacted CCM PharmD) and continue Albuterol as needed, no samples available of Stiolto in office today.  Educated him at length on emphysema and progressive nature of disease.   Continue annual lung screening.  Recommend complete cessation of smoking.  Highly recommend PCV13 vaccination, he refuses.  Return in 5 months for follow-up. ?

## 2021-08-16 NOTE — Assessment & Plan Note (Signed)
Acute with no further episodes, is scheduled for Myo on Friday. Recent visit with cardiology and no changes made.  At this time continue to monitor closely and he is aware to immediately contact provider or go to ER if further CP episodes present. ?

## 2021-08-16 NOTE — Assessment & Plan Note (Signed)
Noted on February 2021 CT screening, continue statin daily and recommend daily Baby ASA.  Recommend complete cessation smoking for prevention. ?

## 2021-08-19 ENCOUNTER — Telehealth: Payer: Self-pay | Admitting: Cardiovascular Disease

## 2021-08-19 NOTE — Telephone Encounter (Signed)
Spoke with pt, he reports he canceled the test because he decided not to do it right now, maybe later. He will call back to the office when he is ready to schedule. Will make dr Rockey Situ aware. ?

## 2021-08-19 NOTE — Telephone Encounter (Signed)
Radiology called, they said pt canceled stress test and di not provide reason, they wanted to let Dr. Rockey Situ know ?

## 2021-08-20 ENCOUNTER — Ambulatory Visit: Payer: Medicare Other | Admitting: Cardiovascular Disease

## 2021-08-30 ENCOUNTER — Telehealth: Payer: Self-pay | Admitting: Nurse Practitioner

## 2021-08-30 NOTE — Telephone Encounter (Signed)
Noted will complete upon return. ?

## 2021-08-30 NOTE — Telephone Encounter (Signed)
Patient walked in and brought in [place] Patient Assistance program paperwork. He has completed his part and needs provider signature. Paperwork placed in provider's folder. ?

## 2021-09-02 NOTE — Telephone Encounter (Signed)
Paperwork was completed and faxed over to [facility].  ?

## 2021-09-28 ENCOUNTER — Other Ambulatory Visit: Payer: Self-pay | Admitting: Nurse Practitioner

## 2021-09-29 NOTE — Telephone Encounter (Signed)
Requested medication (s) are due for refill today - expired Rx, yes ? ?Requested medication (s) are on the active medication list -yes ? ?Future visit scheduled -yes ? ?Last refill: 06/29/20 #90 4RF ? ?Notes to clinic: Expired Rx- passes visit/lab protocol ? ?Requested Prescriptions  ?Pending Prescriptions Disp Refills  ? lisinopril-hydrochlorothiazide (ZESTORETIC) 20-25 MG tablet [Pharmacy Med Name: LISINOPRIL-HCTZ 20-25 MG TAB] 90 tablet 0  ?  Sig: Take 1 tablet by mouth daily.  ?  ? Cardiovascular:  ACEI + Diuretic Combos Passed - 09/28/2021  9:37 AM  ?  ?  Passed - Na in normal range and within 180 days  ?  Sodium  ?Date Value Ref Range Status  ?06/30/2021 137 134 - 144 mmol/L Final  ?   ?  ?  Passed - K in normal range and within 180 days  ?  Potassium  ?Date Value Ref Range Status  ?06/30/2021 3.9 3.5 - 5.2 mmol/L Final  ?   ?  ?  Passed - Cr in normal range and within 180 days  ?  Creatinine, Ser  ?Date Value Ref Range Status  ?06/30/2021 1.06 0.76 - 1.27 mg/dL Final  ?   ?  ?  Passed - eGFR is 30 or above and within 180 days  ?  GFR calc Af Amer  ?Date Value Ref Range Status  ?06/29/2020 84 >59 mL/min/1.73 Final  ?  Comment:  ?  **In accordance with recommendations from the NKF-ASN Task force,** ?  [facility] is in the process of updating its eGFR calculation to the ?  2021 CKD-EPI creatinine equation that estimates kidney function ?  without a race variable. ?  ? ?GFR calc non Af Amer  ?Date Value Ref Range Status  ?06/29/2020 73 >59 mL/min/1.73 Final  ? ?eGFR  ?Date Value Ref Range Status  ?06/30/2021 74 >59 mL/min/1.73 Final  ?   ?  ?  Passed - Patient is not pregnant  ?  ?  Passed - Last BP in normal range  ?  BP Readings from Last 1 Encounters:  ?08/16/21 111/77  ?   ?  ?  Passed - Valid encounter within last 6 months  ?  Recent Outpatient Visits   ? ?      ? 1 month ago Centrilobular emphysema ([place])  ? [place], [place] T, NP  ? 3 months ago Centrilobular emphysema ([place])  ? [place], [place] T, NP  ? 9 months ago Centrilobular emphysema ([place])  ? [place], Mitchell T, NP  ? 1 year ago Centrilobular emphysema ([employer])  ? [place], [place] T, NP  ? 1 year ago Centrilobular emphysema ([place])  ? [facility] [place], Henrine Screws T, NP  ? ?  ?  ?Future Appointments   ? ?        ? In 3 months [name], [name], NP [employer]  ? ?  ? ? ?  ?  ?  ? amLODipine (NORVASC) 5 MG tablet [Pharmacy Med Name: AMLODIPINE BESYLATE 5 MG TAB] 90 tablet 0  ?  Sig: Take 1 tablet (5 mg total) by mouth daily.  ?  ? Cardiovascular: Calcium Channel Blockers 2 Passed - 09/28/2021  9:37 AM  ?  ?  Passed - Last BP in normal range  ?  BP Readings from Last 1 Encounters:  ?08/16/21 111/77  ?   ?  ?  Passed - Last Heart Rate in normal  range  ?  Pulse Readings from Last 1 Encounters:  ?08/16/21 77  ?   ?  ?  Passed - Valid encounter within last 6 months  ?  Recent Outpatient Visits   ? ?      ? 1 month ago Centrilobular emphysema ([place])  ? [place], Mahtomedi T, NP  ? 3 months ago Centrilobular emphysema ([place])  ? [place], [place] T, NP  ? 9 months ago Centrilobular emphysema ([place])  ? [place], Roberts T, NP  ? 1 year ago Centrilobular emphysema ([place])  ? [place], Lamberton T, NP  ? 1 year ago Centrilobular emphysema ([place])  ? [facility] [place], Henrine Screws T, NP  ? ?  ?  ?Future Appointments   ? ?        ? In 3 months [name], [name], NP [employer]  ? ?  ? ? ?  ?  ?  ? ? ? ?Requested Prescriptions  ?Pending Prescriptions Disp Refills  ? lisinopril-hydrochlorothiazide (ZESTORETIC) 20-25 MG tablet [Pharmacy Med Name: LISINOPRIL-HCTZ 20-25 MG TAB] 90 tablet 0  ?  Sig: Take 1 tablet by mouth daily.  ?  ? Cardiovascular:  ACEI + Diuretic Combos Passed - 09/28/2021  9:37 AM  ?  ?  Passed - Na in normal range and within 180 days  ?   Sodium  ?Date Value Ref Range Status  ?06/30/2021 137 134 - 144 mmol/L Final  ?   ?  ?  Passed - K in normal range and within 180 days  ?  Potassium  ?Date Value Ref Range Status  ?06/30/2021 3.9 3.5 - 5.2 mmol/L Final  ?   ?  ?  Passed - Cr in normal range and within 180 days  ?  Creatinine, Ser  ?Date Value Ref Range Status  ?06/30/2021 1.06 0.76 - 1.27 mg/dL Final  ?   ?  ?  Passed - eGFR is 30 or above and within 180 days  ?  GFR calc Af Amer  ?Date Value Ref Range Status  ?06/29/2020 84 >59 mL/min/1.73 Final  ?  Comment:  ?  **In accordance with recommendations from the NKF-ASN Task force,** ?  [facility] is in the process of updating its eGFR calculation to the ?  2021 CKD-EPI creatinine equation that estimates kidney function ?  without a race variable. ?  ? ?GFR calc non Af Amer  ?Date Value Ref Range Status  ?06/29/2020 73 >59 mL/min/1.73 Final  ? ?eGFR  ?Date Value Ref Range Status  ?06/30/2021 74 >59 mL/min/1.73 Final  ?   ?  ?  Passed - Patient is not pregnant  ?  ?  Passed - Last BP in normal range  ?  BP Readings from Last 1 Encounters:  ?08/16/21 111/77  ?   ?  ?  Passed - Valid encounter within last 6 months  ?  Recent Outpatient Visits   ? ?      ? 1 month ago Centrilobular emphysema ([place])  ? [place], [place] T, NP  ? 3 months ago Centrilobular emphysema ([place])  ? [place], [place]utherford T, NP  ? 9 months ago Centrilobular emphysema ([place])  ? [place], [place] T, NP  ? 1 year ago Centrilobular emphysema ([place])  ? [place], De[place]idder T, NP  ? 1 year ago Centrilobular emphysema ([place])  ? [place],  [name], NP  ? ?  ?  ?Future Appointments   ? ?        ? In 3 months [name], [name], NP [employer]  ? ?  ? ? ?  ?  ?  ? amLODipine (NORVASC) 5 MG tablet [Pharmacy Med Name: AMLODIPINE BESYLATE 5 MG TAB] 90 tablet 0  ?  Sig: Take 1 tablet (5 mg total) by mouth daily.  ?  ?  Cardiovascular: Calcium Channel Blockers 2 Passed - 09/28/2021  9:37 AM  ?  ?  Passed - Last BP in normal range  ?  BP Readings from Last 1 Encounters:  ?08/16/21 111/77  ?   ?  ?  Passed - Last Heart Rate in normal range  ?  Pulse Readings from Last 1 Encounters:  ?08/16/21 77  ?   ?  ?  Passed - Valid encounter within last 6 months  ?  Recent Outpatient Visits   ? ?      ? 1 month ago Centrilobular emphysema ([place])  ? [place], Dike T, NP  ? 3 months ago Centrilobular emphysema ([place])  ? [place], Inverness T, NP  ? 9 months ago Centrilobular emphysema ([place])  ? [place], McDonald T, NP  ? 1 year ago Centrilobular emphysema ([place])  ? [place], [place] T, NP  ? 1 year ago Centrilobular emphysema ([place])  ? [facility] [place], Henrine Screws T, NP  ? ?  ?  ?Future Appointments   ? ?        ? In 3 months [name], [name], NP [employer]  ? ?  ? ? ?  ?  ?  ? ? ? ?

## 2021-10-04 ENCOUNTER — Telehealth: Payer: Self-pay | Admitting: Nurse Practitioner

## 2021-10-04 MED ORDER — UMECLIDINIUM-VILANTEROL 62.5-25 MCG/ACT IN AEPB: 1.0000 | INHALATION_SPRAY | Freq: Every day | RESPIRATORY_TRACT | 4 refills | Status: DC

## 2021-10-04 NOTE — Telephone Encounter (Signed)
[employer] calling to ask if you can change the pt's inhaler umeclidinium-vilanterol (ANORO ELLIPTA) 62.5-25 MCG/ACT AEPB To  Symbicort  The co pay is less.  [place], [place] - [address]

## 2021-10-07 MED ORDER — UMECLIDINIUM-VILANTEROL 62.5-25 MCG/ACT IN AEPB: 1.0000 | INHALATION_SPRAY | Freq: Every day | RESPIRATORY_TRACT | 4 refills | Status: DC

## 2021-10-07 NOTE — Addendum Note (Signed)
Addended by: Marnee Guarneri T on: 10/07/2021 08:59 AM   Modules accepted: Orders

## 2021-10-29 ENCOUNTER — Other Ambulatory Visit: Payer: Self-pay | Admitting: Nurse Practitioner

## 2021-10-29 NOTE — Telephone Encounter (Signed)
Requested Prescriptions  Pending Prescriptions Disp Refills  . atorvastatin (LIPITOR) 20 MG tablet [Pharmacy Med Name: ATORVASTATIN 20 MG TABLET] 90 tablet 2    Sig: Take 1 tablet (20 mg total) by mouth daily.     Cardiovascular:  Antilipid - Statins Failed - 10/29/2021 10:00 AM      Failed - Lipid Panel in normal range within the last 12 months    Cholesterol, Total  Date Value Ref Range Status  06/30/2021 145 100 - 199 mg/dL Final   Cholesterol Piccolo, Waived  Date Value Ref Range Status  04/30/2015 194 <200 mg/dL Final    Comment:                            Desirable                <200                         Borderline High      200- 239                         High                     >239    LDL Chol Calc (NIH)  Date Value Ref Range Status  06/30/2021 71 0 - 99 mg/dL Final   HDL  Date Value Ref Range Status  06/30/2021 29 (L) >39 mg/dL Final   Triglycerides  Date Value Ref Range Status  06/30/2021 280 (H) 0 - 149 mg/dL Final   Triglycerides Piccolo,Waived  Date Value Ref Range Status  04/30/2015 182 (H) <150 mg/dL Final    Comment:                            Normal                   <150                         Borderline High     150 - 199                         High                200 - 499                         Very High                >499          Passed - Patient is not pregnant      Passed - Valid encounter within last 12 months    Recent Outpatient Visits          2 months ago Centrilobular emphysema ([place])   [place], Jolene T, NP   4 months ago Centrilobular emphysema ([place])   [place], Jolene T, NP   10 months ago Centrilobular emphysema ([place])   [place], Henrine Screws T, NP   1 year ago Centrilobular emphysema ([place])   [place], Henrine Screws T, NP   1 year ago Centrilobular emphysema ([place])   [place], [name], NP  Future  Appointments            In 2 months [name], [name], NP [employer]

## 2021-11-24 ENCOUNTER — Telehealth: Payer: Self-pay

## 2021-11-24 NOTE — Chronic Care Management (AMB) (Signed)
Chronic Care Management Pharmacy Assistant   Name: Bradin Mcadory  MRN: [id number] DOB: 05-30-1945   Reason for Encounter: Disease State-General   Recent office visits:  None since last coordination call 08/16/21  Recent consult visits:  None since last coordination call 08/16/21  Hospital visits:  None in previous 6 months  Medications: Outpatient Encounter Medications as of 11/24/2021  Medication Sig   amLODipine (NORVASC) 5 MG tablet Take 1 tablet (5 mg total) by mouth daily.   lisinopril-hydrochlorothiazide (ZESTORETIC) 20-25 MG tablet Take 1 tablet by mouth daily.   albuterol (PROAIR HFA) 108 (90 Base) MCG/ACT inhaler Inhale 2 puffs into the lungs every 6 (six) hours as needed for wheezing or shortness of breath. (Patient not taking: Reported on 08/16/2021)   atorvastatin (LIPITOR) 20 MG tablet Take 1 tablet (20 mg total) by mouth daily.   umeclidinium-vilanterol (ANORO ELLIPTA) 62.5-25 MCG/ACT AEPB Inhale 1 puff into the lungs daily at 6 (six) AM.   No facility-administered encounter medications on file as of 11/24/2021.   [place] for General Review Call   Chart Review:  Have there been any documented new, changed, or discontinued medications since last visit? No (If yes, include name, dose, frequency, date) Has there been any documented recent hospitalizations or ED visits since last visit with Clinical Pharmacist? No Brief Summary (including medication and/or Diagnosis changes):   Adherence Review:  Does the Clinical Pharmacist Assistant have access to adherence rates? Yes Adherence rates for STAR metric medications (List medication(s)/day supply/ last 2 fill dates). Adherence rates for medications indicated for disease state being reviewed (List medication(s)/day supply/ last 2 fill dates). Does the patient have >5 day gap between last estimated fill dates for any of the above medications or other medication gaps? No Reason for  medication gaps.   Disease State Questions:  Able to connect with Patient? Yes  Did patient have any problems with their health recently? No Note problems and Concerns:  Have you had any admissions or emergency room visits or worsening of your condition(s) since last visit? No Details of ED visit, hospital visit and/or worsening condition(s):  Have you had any visits with new specialists or providers since your last visit? No Explain:  Have you had any new health care problem(s) since your last visit? No New problem(s) reported:  Have you run out of any of your medications since you last spoke with clinical pharmacist? No What caused you to run out of your medications?  Are there any medications you are not taking as prescribed? No What kept you from taking your medications as prescribed?  Are you having any issues or side effects with your medications? No Note of issues or side effects:  Do you have any other health concerns or questions you want to discuss with your Clinical Pharmacist before your next visit? No Note additional concerns and questions from Patient.  Are there any health concerns that you feel we can do a better job addressing? No Note Patient's response.  Are you having any problems with any of the following since the last visit: (select all that apply)  None  Details:  12. Any falls since last visit? No  Details:  13. Any increased or uncontrolled pain since last visit? No  Details:  14. Next visit Type: telephone       Visit with:Clinical        Date:01/31/22        Time:2pm  15. Additional Details? No  Care Gaps: Colonoscopy-04/11/16 Diabetic Foot Exam-NA Ophthalmology-NA Dexa Scan - NA Annual Well Visit - NA Micro albumin-NA Hemoglobin A1c- NA  Star Rating Drugs: Lisinopril-HCTZ 20-25 mg-last fill 09/29/21 90 ds Atorvastatin 20 mg-last fill 10/29/21 90 ds  Ethelene Hal Clinical Pharmacist Assistant [phone]

## 2022-01-16 NOTE — Patient Instructions (Signed)
COPD and Physical Activity ?Chronic obstructive pulmonary disease (COPD) is a long-term, or chronic, condition that affects the lungs. COPD is a general term that can be used to describe many problems that cause inflammation of the lungs and limit airflow. These conditions include chronic bronchitis and emphysema. ?The main symptom of COPD is shortness of breath, which makes it harder to do even simple tasks. This can also make it harder to exercise and stay active. Talk with your health care provider about treatments to help you breathe better and actions you can take to prevent breathing problems during physical activity. ?What are the benefits of exercising when you have COPD? ?Exercising regularly is an important part of a healthy lifestyle. You can still exercise and do physical activities even though you have COPD. Exercise and physical activity improve your shortness of breath by increasing blood flow (circulation). This causes your heart to pump more oxygen through your body. Moderate exercise can: ?Improve oxygen use. ?Increase your energy level. ?Help with shortness of breath. ?Strengthen your breathing muscles. ?Improve heart health. ?Help with sleep. ?Improve your self-esteem and feelings of self-worth. ?Lower depression, stress, and anxiety. ?Exercise can benefit everyone with COPD. The severity of your disease may affect how hard you can exercise, especially at first, but everyone can benefit. Talk with your health care provider about how much exercise is safe for you, and which activities and exercises are safe for you. ?What actions can I take to prevent breathing problems during physical activity? ?Sign up for a pulmonary rehabilitation program. This type of program may include: ?Education about lung diseases. ?Exercise classes that teach you how to exercise and be more active while improving your breathing. This usually involves: ?Exercise using your lower extremities, such as a stationary  bicycle. ?About 30 minutes of exercise, 2 to 5 times per week, for 6 to 12 weeks. ?Strength training, such as push-ups or leg lifts. ?Nutrition education. ?Group classes in which you can talk with others who also have COPD and learn ways to manage stress. ?If you use an oxygen tank, you should use it while you exercise. Work with your health care provider to adjust your oxygen for your physical activity. Your resting flow rate is different from your flow rate during physical activity. ?How to manage your breathing while exercising ?While you are exercising: ?Take slow breaths. ?Pace yourself, and do nottry to go too fast. ?Purse your lips while breathing out. Pursing your lips is similar to a kissing or whistling position. ?If doing exercise that uses a quick burst of effort, such as weight lifting: ?Breathe in before starting the exercise. ?Breathe out during the hardest part of the exercise, such as raising the weights. ?Where to find support ?You can find support for exercising with COPD from: ?Your health care provider. ?A pulmonary rehabilitation program. ?Your local health department or community health programs. ?Support groups, either online or in-person. Your health care provider may be able to recommend support groups. ?Where to find more information ?You can find more information about exercising with COPD from: ?American Lung Association: lung.org ?COPD Foundation: copdfoundation.org ?Contact a health care provider if: ?Your symptoms get worse. ?You have nausea. ?You have a fever. ?You want to start a new exercise program or a new activity. ?Get help right away if: ?You have chest pain. ?You cannot breathe. ?These symptoms may represent a serious problem that is an emergency. Do not wait to see if the symptoms will go away. Get medical help right away. Call   your local emergency services (911 in the [place] Do not drive yourself to the hospital. ?Summary ?COPD is a general term that can be used to describe  many different lung problems that cause lung inflammation and limit airflow. This includes chronic bronchitis and emphysema. ?Exercise and physical activity improve your shortness of breath by increasing blood flow (circulation). This causes your heart to provide more oxygen to your body. ?Contact your health care provider before starting any exercise program or new activity. Ask your health care provider what exercises and activities are safe for you. ?This information is not intended to replace advice given to you by your health care provider. Make sure you discuss any questions you have with your health care provider. ?Document Revised: 03/10/2020 Document Reviewed: 03/10/2020 ?Elsevier Patient Education ? 2023 Elsevier Inc. ? ?

## 2022-01-19 ENCOUNTER — Encounter: Payer: Self-pay | Admitting: Nurse Practitioner

## 2022-01-19 ENCOUNTER — Telehealth: Payer: Self-pay

## 2022-01-19 ENCOUNTER — Ambulatory Visit (INDEPENDENT_AMBULATORY_CARE_PROVIDER_SITE_OTHER): Payer: Medicare HMO | Admitting: Nurse Practitioner

## 2022-01-19 VITALS — BP 107/69 | HR 72 | Temp 97.8°F | Ht 69.0 in | Wt 194.6 lb

## 2022-01-19 DIAGNOSIS — F17210 Nicotine dependence, cigarettes, uncomplicated: Secondary | ICD-10-CM

## 2022-01-19 DIAGNOSIS — I25118 Atherosclerotic heart disease of native coronary artery with other forms of angina pectoris: Secondary | ICD-10-CM | POA: Diagnosis not present

## 2022-01-19 DIAGNOSIS — I10 Essential (primary) hypertension: Secondary | ICD-10-CM | POA: Diagnosis not present

## 2022-01-19 DIAGNOSIS — E782 Mixed hyperlipidemia: Secondary | ICD-10-CM

## 2022-01-19 DIAGNOSIS — I700 Atherosclerosis of aorta: Secondary | ICD-10-CM

## 2022-01-19 DIAGNOSIS — R69 Illness, unspecified: Secondary | ICD-10-CM | POA: Diagnosis not present

## 2022-01-19 DIAGNOSIS — E559 Vitamin D deficiency, unspecified: Secondary | ICD-10-CM | POA: Diagnosis not present

## 2022-01-19 DIAGNOSIS — J432 Centrilobular emphysema: Secondary | ICD-10-CM | POA: Diagnosis not present

## 2022-01-19 MED ORDER — ALBUTEROL SULFATE HFA 108 (90 BASE) MCG/ACT IN AERS: 2.0000 | INHALATION_SPRAY | Freq: Four times a day (QID) | RESPIRATORY_TRACT | 4 refills | Status: DC | PRN

## 2022-01-19 NOTE — Assessment & Plan Note (Signed)
Chronic, stable without current maintenance medications.  Continue to recommend complete smoking cessation.  Unable to obtain inhaler maintenance as too costly will work with CCM team on this.  Educated him at length on emphysema and progressive nature of disease.   Continue annual lung screening.  Recommend complete cessation of smoking.  Spirometry up to date, due next February 2024.

## 2022-01-19 NOTE — Assessment & Plan Note (Signed)
Chronic and stable with no recent CP.  Continue current medications and collaboration with cardiology.

## 2022-01-19 NOTE — Assessment & Plan Note (Signed)
Chronic, stable with BP at goal today.  Recommend he start checking BP at home three mornings a week and documenting for provider.  Discussed goal BP with him.  Continue current medication regimen and adjust as needed.  Return in 6 months.  LABS: CMP.

## 2022-01-19 NOTE — Assessment & Plan Note (Signed)
I have recommended complete cessation of tobacco use. I have discussed various options available for assistance with tobacco cessation including over the counter methods (Nicotine gum, patch and lozenges). We also discussed prescription options (Chantix, Nicotine Inhaler / Nasal Spray). The patient is not interested in pursuing any prescription tobacco cessation options at this time.  

## 2022-01-19 NOTE — Assessment & Plan Note (Signed)
Chronic, recommend he take Vitamin D3 2000 units daily.  Recheck today. 

## 2022-01-19 NOTE — Progress Notes (Signed)
BP 107/69   Pulse 72   Temp 97.8 F (36.6 C) (Oral)   Ht '5\' 9"'$  (1.753 m)   Wt 194 lb 9.6 oz (88.3 kg)   SpO2 96%   BMI 28.74 kg/m    Subjective:    Patient ID: Brandon Hart, male    DOB: May 04, 1946, 76 y.o.   MRN: [id number]  HPI: Brandon Hart is a 76 y.o. male  Chief Complaint  Patient presents with   COPD   Hypertension   Hyperlipidemia   HYPERTENSION / HYPERLIPIDEMIA Continues on Lipitor and Norvasc + Lisinopril-HCTZ.   Followed by cardiology with last visit on 08/11/21 for CAD with stable angina.  Denies any recent CP. Satisfied with current treatment? yes Duration of hypertension: chronic BP monitoring frequency: not checking BP range: not checking BP medication side effects: no Duration of hyperlipidemia: chronic Cholesterol medication side effects: no Cholesterol supplements: none Medication compliance: good compliance Aspirin: no Recent stressors: no Recurrent headaches: no Visual changes: no Palpitations: no Dyspnea: no Chest pain: no Lower extremity edema: no Dizzy/lightheaded: no The 10-year ASCVD risk score (Arnett DK, et al., 2019) is: 23.9%   Values used to calculate the score:     Age: 54 years     Sex: Male     Is Non-Hispanic African American: No     Diabetic: No     Tobacco smoker: Yes     Systolic Blood Pressure: 427 mmHg     Is BP treated: Yes     HDL Cholesterol: 29 mg/dL     Total Cholesterol: 145 mg/dL     COPD Last FEV1 66% and FEV1/FVC 90% pre in February 2023.  Currently no maintenance medication (could not get Anoro, too costly -- could not get assistance), using Albuterol as needed.  Continues to smoke 1/2 pack per day, this is down from previous 1 PPD and encouraged him to continue reduction.  He is not interested in medication to assist with quitting.    Had last lung CT screening in March 2023 -- with mild centrilobular and paraseptal emphysema noted + aortic atherosclerosis. COPD status:  stable Satisfied with current treatment?: yes Oxygen use: no Dyspnea frequency: none Cough frequency: occasional on daily basis Rescue inhaler frequency: twice a week Limitation of activity: no Productive cough: none Last Spirometry: February 2023 Pneumovax: Not up to Date -- refused Influenza: Not Up to Date -- refused   Relevant past medical, surgical, family and social history reviewed and updated as indicated. Interim medical history since our last visit reviewed. Allergies and medications reviewed and updated.  Review of Systems  Constitutional:  Negative for activity change, diaphoresis, fatigue and fever.  Respiratory:  Positive for wheezing (intermittent at baseline). Negative for cough, chest tightness and shortness of breath.   Cardiovascular:  Negative for chest pain, palpitations and leg swelling.  Gastrointestinal: Negative.   Neurological: Negative.   Psychiatric/Behavioral:  Negative for decreased concentration, self-injury, sleep disturbance and suicidal ideas. The patient is not nervous/anxious.     Per HPI unless specifically indicated above     Objective:    BP 107/69   Pulse 72   Temp 97.8 F (36.6 C) (Oral)   Ht '5\' 9"'$  (1.753 m)   Wt 194 lb 9.6 oz (88.3 kg)   SpO2 96%   BMI 28.74 kg/m   Wt Readings from Last 3 Encounters:  01/19/22 194 lb 9.6 oz (88.3 kg)  08/16/21 199 lb (90.3 kg)  08/11/21 200 lb 8 oz (90.9  kg)    Physical Exam Vitals and nursing note reviewed.  Constitutional:      General: He is awake. He is not in acute distress.    Appearance: He is well-developed and well-groomed. He is not ill-appearing or toxic-appearing.  HENT:     Head: Normocephalic and atraumatic.     Right Ear: Hearing normal. No drainage.     Left Ear: Hearing normal. No drainage.  Eyes:     General: Lids are normal.        Right eye: No discharge.        Left eye: No discharge.     Conjunctiva/sclera: Conjunctivae normal.     Pupils: Pupils are equal, round,  and reactive to light.  Neck:     Thyroid: No thyromegaly.     Vascular: No carotid bruit.  Cardiovascular:     Rate and Rhythm: Normal rate and regular rhythm.     Heart sounds: Normal heart sounds, S1 normal and S2 normal. No murmur heard.    No gallop.  Pulmonary:     Effort: Pulmonary effort is normal. No accessory muscle usage or respiratory distress.     Breath sounds: Wheezing present. No rhonchi.     Comments: Intermittent expiratory wheezes noted. Abdominal:     General: Bowel sounds are normal.     Palpations: Abdomen is soft.  Musculoskeletal:     Right hand: Normal pulse.     Cervical back: Normal range of motion and neck supple.     Right lower leg: No edema.     Left lower leg: No edema.  Lymphadenopathy:     Cervical: No cervical adenopathy.  Skin:    General: Skin is warm and dry.     Capillary Refill: Capillary refill takes less than 2 seconds.  Neurological:     Mental Status: He is alert and oriented to person, place, and time.     Deep Tendon Reflexes: Reflexes are normal and symmetric.  Psychiatric:        Attention and Perception: Attention normal.        Mood and Affect: Mood normal.        Speech: Speech normal.        Behavior: Behavior normal. Behavior is cooperative.        Thought Content: Thought content normal.    Results for orders placed or performed in visit on 07/08/21  ECHOCARDIOGRAM COMPLETE  Result Value Ref Range   S' Lateral 3.00 cm   Area-P 1/2 3.27 cm2   Single Plane A4C EF 56.9 %   Single Plane A2C EF 49.6 %   Calc EF 53.8 %      Assessment & Plan:   Problem List Items Addressed This Visit       Cardiovascular and Mediastinum   Aortic atherosclerosis (HCC)    Chronic.  Noted on lung CA CT screening, continue statin daily and recommend daily Baby ASA.  Recommend complete cessation smoking for prevention.      Relevant Orders   Comprehensive metabolic panel   Lipid Panel w/o Chol/HDL Ratio   Coronary artery disease of  native artery of native heart with stable angina pectoris (HCC)    Chronic and stable with no recent CP.  Continue current medications and collaboration with cardiology.      Hypertension    Chronic, stable with BP at goal today.  Recommend he start checking BP at home three mornings a week and documenting for provider.  Discussed goal  BP with him.  Continue current medication regimen and adjust as needed.  Return in 6 months.  LABS: CMP.       Relevant Orders   Comprehensive metabolic panel     Respiratory   Centrilobular emphysema ([place]) - Primary    Chronic, stable without current maintenance medications.  Continue to recommend complete smoking cessation.  Unable to obtain inhaler maintenance as too costly will work with CCM team on this.  Educated him at length on emphysema and progressive nature of disease.   Continue annual lung screening.  Recommend complete cessation of smoking.  Spirometry up to date, due next February 2024.      Relevant Medications   albuterol (PROAIR HFA) 108 (90 Base) MCG/ACT inhaler     Other   Hyperlipidemia    Chronic, ongoing.  Continue current medication regimen and adjust as needed.  Lipid panel today.   Return in 6 months.      Relevant Orders   Comprehensive metabolic panel   Lipid Panel w/o Chol/HDL Ratio   Nicotine dependence, cigarettes, uncomplicated    I have recommended complete cessation of tobacco use. I have discussed various options available for assistance with tobacco cessation including over the counter methods (Nicotine gum, patch and lozenges). We also discussed prescription options (Chantix, Nicotine Inhaler / Nasal Spray). The patient is not interested in pursuing any prescription tobacco cessation options at this time.       Vitamin D deficiency    Chronic, recommend he take Vitamin D3 2000 units daily.  Recheck today.      Relevant Orders   VITAMIN D 25 Hydroxy (Vit-D Deficiency, Fractures)     Follow up plan: Return in  about 6 months (around 07/20/2022) for Annual physical with spirometry needed.

## 2022-01-19 NOTE — Assessment & Plan Note (Signed)
Chronic, ongoing.  Continue current medication regimen and adjust as needed.  Lipid panel today.  Return in 6 months. 

## 2022-01-19 NOTE — Telephone Encounter (Signed)
PCP wanted me to look into maintenance inhalers for PAP. Anoro should be easy to cover. Sent msg back to PCP

## 2022-01-19 NOTE — Assessment & Plan Note (Signed)
Chronic.  Noted on lung CA CT screening, continue statin daily and recommend daily Baby ASA.  Recommend complete cessation smoking for prevention. 

## 2022-01-20 LAB — COMPREHENSIVE METABOLIC PANEL
ALT: 16 IU/L (ref 0–44)
AST: 16 IU/L (ref 0–40)
Albumin/Globulin Ratio: 1.9 (ref 1.2–2.2)
Albumin: 4.6 g/dL (ref 3.8–4.8)
Alkaline Phosphatase: 110 IU/L (ref 44–121)
BUN/Creatinine Ratio: 20 (ref 10–24)
BUN: 26 mg/dL (ref 8–27)
Bilirubin Total: 0.4 mg/dL (ref 0.0–1.2)
CO2: 24 mmol/L (ref 20–29)
Calcium: 9.9 mg/dL (ref 8.6–10.2)
Chloride: 99 mmol/L (ref 96–106)
Creatinine, Ser: 1.29 mg/dL — ABNORMAL HIGH (ref 0.76–1.27)
Globulin, Total: 2.4 g/dL (ref 1.5–4.5)
Glucose: 87 mg/dL (ref 70–99)
Potassium: 4.1 mmol/L (ref 3.5–5.2)
Sodium: 140 mmol/L (ref 134–144)
Total Protein: 7 g/dL (ref 6.0–8.5)
eGFR: 58 mL/min/{1.73_m2} — ABNORMAL LOW (ref >59)

## 2022-01-20 LAB — LIPID PANEL W/O CHOL/HDL RATIO
Cholesterol, Total: 134 mg/dL (ref 100–199)
HDL: 30 mg/dL — ABNORMAL LOW (ref >39)
LDL Chol Calc (NIH): 68 mg/dL (ref 0–99)
Triglycerides: 217 mg/dL — ABNORMAL HIGH (ref 0–149)
VLDL Cholesterol Cal: 36 mg/dL (ref 5–40)

## 2022-01-20 LAB — VITAMIN D 25 HYDROXY (VIT D DEFICIENCY, FRACTURES): Vit D, 25-Hydroxy: 40.5 ng/mL (ref 30.0–100.0)

## 2022-01-20 NOTE — Progress Notes (Signed)
Good afternoon, please let Brandon Hart know that labs have returned -- Brandon Hart for you that I am alerting him that you will be reaching out: - Kidney function is showing some mild kidney disease this check, please increase water intake and reduce salt intake + add a little lemon to water.  We will continue to monitor and recheck next visit as this is new for you. Cholesterol levels show stable LDL, bad cholesterol -- continue Atorvastatin. - Vitamin D normal, continue supplement.   Brandon Hart our amazing pharmacist will be reaching out to you as he believes we can get you assistance for Anoro!!  Please listen for phone calls and look in mail in case he sends forms to fill out.  Any questions? Keep being amazing!!  Thank you for allowing me to participate in your care.  I appreciate you. Kindest regards, Brandon Hart

## 2022-01-24 ENCOUNTER — Telehealth: Payer: Self-pay

## 2022-01-24 NOTE — Chronic Care Management (AMB) (Signed)
    Chronic Care Management Pharmacy Assistant   Name: Brandon Hart  MRN: [id number] DOB: 11-09-1945  Reason for Encounter: Patient assistance application on Anoro    Medications: Outpatient Encounter Medications as of 01/24/2022  Medication Sig   albuterol (PROAIR HFA) 108 (90 Base) MCG/ACT inhaler Inhale 2 puffs into the lungs every 6 (six) hours as needed for wheezing or shortness of breath.   amLODipine (NORVASC) 5 MG tablet Take 1 tablet (5 mg total) by mouth daily.   atorvastatin (LIPITOR) 20 MG tablet Take 1 tablet (20 mg total) by mouth daily.   lisinopril-hydrochlorothiazide (ZESTORETIC) 20-25 MG tablet Take 1 tablet by mouth daily.   No facility-administered encounter medications on file as of 01/24/2022.   I have prefilled and mailed out the application for the patient to fill out. Patient states he has picked up his Anoro Inhalor at his pharmacy without any issues of cost. Patient states it didn't cost him anything. I stated to the patient to keep the application once he receives it in the mail just in case if needed for future.  Patient understood and is aware.  Corrie Mckusick, [place]

## 2022-01-27 ENCOUNTER — Telehealth: Payer: Self-pay | Admitting: Nurse Practitioner

## 2022-01-27 NOTE — Telephone Encounter (Signed)
Copied from [place] [phone]. Topic: General - Other >> Jan 27, 2022 12:20 PM Everette C wrote: Reason for CRM: The patient would like to speak with a member of clinical staff when possible about their recent lab work   Please contact further when possible

## 2022-01-28 NOTE — Telephone Encounter (Signed)
Spoke with patient in regards to questions to his recent labs results. Patient questions were answered and verbalized understanding and has no further questions. Patient advised to give our office a call if he has any other questions.

## 2022-01-31 ENCOUNTER — Telehealth: Payer: Medicare Other

## 2022-02-08 DIAGNOSIS — Z720 Tobacco use: Secondary | ICD-10-CM | POA: Diagnosis not present

## 2022-02-08 DIAGNOSIS — I10 Essential (primary) hypertension: Secondary | ICD-10-CM | POA: Diagnosis not present

## 2022-02-08 DIAGNOSIS — J449 Chronic obstructive pulmonary disease, unspecified: Secondary | ICD-10-CM | POA: Diagnosis not present

## 2022-02-08 DIAGNOSIS — Z809 Family history of malignant neoplasm, unspecified: Secondary | ICD-10-CM | POA: Diagnosis not present

## 2022-02-08 DIAGNOSIS — Z833 Family history of diabetes mellitus: Secondary | ICD-10-CM | POA: Diagnosis not present

## 2022-02-08 DIAGNOSIS — M1990 Unspecified osteoarthritis, unspecified site: Secondary | ICD-10-CM | POA: Diagnosis not present

## 2022-02-08 DIAGNOSIS — Z008 Encounter for other general examination: Secondary | ICD-10-CM | POA: Diagnosis not present

## 2022-02-08 DIAGNOSIS — I739 Peripheral vascular disease, unspecified: Secondary | ICD-10-CM | POA: Diagnosis not present

## 2022-02-08 DIAGNOSIS — E785 Hyperlipidemia, unspecified: Secondary | ICD-10-CM | POA: Diagnosis not present

## 2022-03-28 ENCOUNTER — Telehealth: Payer: Self-pay

## 2022-03-28 ENCOUNTER — Telehealth: Payer: Medicare Other

## 2022-03-28 NOTE — Telephone Encounter (Signed)
  Care Management   Follow Up Note   03/28/2022 Name: Brandon Hart MRN: [id number] DOB: [birth year]   Referred by: Venita Lick, NP Reason for referral : Chronic Care Management   An unsuccessful telephone outreach was attempted today. The patient was referred to the case management team for assistance with care management and care coordination.   Follow Up Plan: The patient has been provided with contact information for the care management team and has been advised to call with any health related questions or concerns.   [place] Constable, Pharm.D. - [phone]

## 2022-05-02 ENCOUNTER — Telehealth: Payer: Medicare HMO

## 2022-06-13 ENCOUNTER — Ambulatory Visit (INDEPENDENT_AMBULATORY_CARE_PROVIDER_SITE_OTHER): Payer: Medicare HMO | Admitting: Nurse Practitioner

## 2022-06-13 ENCOUNTER — Encounter: Payer: Self-pay | Admitting: Nurse Practitioner

## 2022-06-13 VITALS — BP 126/79 | HR 102 | Temp 98.2°F | Wt 190.8 lb

## 2022-06-13 DIAGNOSIS — R972 Elevated prostate specific antigen [PSA]: Secondary | ICD-10-CM | POA: Diagnosis not present

## 2022-06-13 DIAGNOSIS — N50812 Left testicular pain: Secondary | ICD-10-CM | POA: Diagnosis not present

## 2022-06-13 DIAGNOSIS — N451 Epididymitis: Secondary | ICD-10-CM

## 2022-06-13 MED ORDER — LEVOFLOXACIN 500 MG PO TABS
500.0000 mg | ORAL_TABLET | Freq: Every day | ORAL | 0 refills | Status: AC
Start: 2022-06-13 — End: 2022-06-23

## 2022-06-13 NOTE — Progress Notes (Signed)
BP 126/79   Pulse (!) 102   Temp 98.2 F (36.8 C) (Oral)   Wt 190 lb 12.8 oz (86.5 kg)   SpO2 96%   BMI 28.18 kg/m    Subjective:    Patient ID: Brandon Hart, male    DOB: March 01, 1946, 77 y.o.   MRN: [id number]  HPI: Brandon Hart is a 77 y.o. male  Chief Complaint  Patient presents with   Testicle Pain    Pt states he has been having L testicle pain for the last 3 to 4 days. States the pain comes and goes, hurts more when he sits down.    TESTICULAR PAIN Patient states the pain has been going on for the last 3-4 days.  Patient states he had this in the past and was told it was an infection.  Denies any fever.  He does feel nauseous and the pain is worse with sitting.  Not sure if there is redness but there is some swelling.    Relevant past medical, surgical, family and social history reviewed and updated as indicated. Interim medical history since our last visit reviewed. Allergies and medications reviewed and updated.  Review of Systems  Genitourinary:  Positive for testicular pain.    Per HPI unless specifically indicated above     Objective:    BP 126/79   Pulse (!) 102   Temp 98.2 F (36.8 C) (Oral)   Wt 190 lb 12.8 oz (86.5 kg)   SpO2 96%   BMI 28.18 kg/m   Wt Readings from Last 3 Encounters:  06/13/22 190 lb 12.8 oz (86.5 kg)  01/19/22 194 lb 9.6 oz (88.3 kg)  08/16/21 199 lb (90.3 kg)    Physical Exam Vitals and nursing note reviewed.  Constitutional:      General: He is not in acute distress.    Appearance: Normal appearance. He is not ill-appearing, toxic-appearing or diaphoretic.  HENT:     Head: Normocephalic.     Right Ear: External ear normal.     Left Ear: External ear normal.     Nose: Nose normal. No congestion or rhinorrhea.     Mouth/Throat:     Mouth: Mucous membranes are moist.  Eyes:     General:        Right eye: No discharge.        Left eye: No discharge.     Extraocular Movements: Extraocular movements  intact.     Conjunctiva/sclera: Conjunctivae normal.     Pupils: Pupils are equal, round, and reactive to light.  Cardiovascular:     Rate and Rhythm: Normal rate and regular rhythm.     Heart sounds: No murmur heard. Pulmonary:     Effort: Pulmonary effort is normal. No respiratory distress.     Breath sounds: Normal breath sounds. No wheezing, rhonchi or rales.  Abdominal:     General: Abdomen is flat. Bowel sounds are normal.  Genitourinary:    Penis: No erythema, tenderness or swelling.      Testes:        Right: Tenderness and swelling present.        Left: Tenderness and swelling present.     Epididymis:     Right: Inflamed. Tenderness present.     Left: Inflamed. Tenderness present.  Musculoskeletal:     Cervical back: Normal range of motion and neck supple.  Skin:    General: Skin is warm and dry.     Capillary Refill: Capillary refill  takes less than 2 seconds.  Neurological:     General: No focal deficit present.     Mental Status: He is alert and oriented to person, place, and time.  Psychiatric:        Mood and Affect: Mood normal.        Behavior: Behavior normal.        Thought Content: Thought content normal.        Judgment: Judgment normal.     Results for orders placed or performed in visit on 01/19/22  Comprehensive metabolic panel  Result Value Ref Range   Glucose 87 70 - 99 mg/dL   BUN 26 8 - 27 mg/dL   Creatinine, Ser 1.29 (H) 0.76 - 1.27 mg/dL   eGFR 58 (L) >59 mL/min/1.73   BUN/Creatinine Ratio 20 10 - 24   Sodium 140 134 - 144 mmol/L   Potassium 4.1 3.5 - 5.2 mmol/L   Chloride 99 96 - 106 mmol/L   CO2 24 20 - 29 mmol/L   Calcium 9.9 8.6 - 10.2 mg/dL   Total Protein 7.0 6.0 - 8.5 g/dL   Albumin 4.6 3.8 - 4.8 g/dL   Globulin, Total 2.4 1.5 - 4.5 g/dL   Albumin/Globulin Ratio 1.9 1.2 - 2.2   Bilirubin Total 0.4 0.0 - 1.2 mg/dL   Alkaline Phosphatase 110 44 - 121 IU/L   AST 16 0 - 40 IU/L   ALT 16 0 - 44 IU/L  Lipid Panel w/o Chol/HDL Ratio   Result Value Ref Range   Cholesterol, Total 134 100 - 199 mg/dL   Triglycerides 217 (H) 0 - 149 mg/dL   HDL 30 (L) >39 mg/dL   VLDL Cholesterol Cal 36 5 - 40 mg/dL   LDL Chol Calc (NIH) 68 0 - 99 mg/dL  VITAMIN D 25 Hydroxy (Vit-D Deficiency, Fractures)  Result Value Ref Range   Vit D, 25-Hydroxy 40.5 30.0 - 100.0 ng/mL      Assessment & Plan:   Problem List Items Addressed This Visit   None Visit Diagnoses     Epididymitis    -  Primary   Will treat with Levofloxacin.  CBC and PSA checked at visit today.  Follow up if symptoms not improved.   Left testicular pain       Relevant Orders   PSA   CBC w/Diff        Follow up plan: No follow-ups on file.

## 2022-06-14 LAB — CBC WITH DIFFERENTIAL/PLATELET
Basophils Absolute: 0.1 10*3/uL (ref 0.0–0.2)
Basos: 1 %
EOS (ABSOLUTE): 0.1 10*3/uL (ref 0.0–0.4)
Eos: 2 %
Hematocrit: 41.9 % (ref 37.5–51.0)
Hemoglobin: 14.2 g/dL (ref 13.0–17.7)
Immature Grans (Abs): 0 10*3/uL (ref 0.0–0.1)
Immature Granulocytes: 0 %
Lymphocytes Absolute: 2.8 10*3/uL (ref 0.7–3.1)
Lymphs: 29 %
MCH: 29.7 pg (ref 26.6–33.0)
MCHC: 33.9 g/dL (ref 31.5–35.7)
MCV: 88 fL (ref 79–97)
Monocytes Absolute: 0.6 10*3/uL (ref 0.1–0.9)
Monocytes: 7 %
Neutrophils Absolute: 5.9 10*3/uL (ref 1.4–7.0)
Neutrophils: 61 %
Platelets: 216 10*3/uL (ref 150–450)
RBC: 4.78 x10E6/uL (ref 4.14–5.80)
RDW: 12.4 % (ref 11.6–15.4)
WBC: 9.6 10*3/uL (ref 3.4–10.8)

## 2022-06-14 LAB — PSA: Prostate Specific Ag, Serum: 6.3 ng/mL — ABNORMAL HIGH (ref 0.0–4.0)

## 2022-06-14 NOTE — Addendum Note (Signed)
Addended by: Jon Billings on: 06/14/2022 10:37 AM   Modules accepted: Orders

## 2022-06-14 NOTE — Progress Notes (Signed)
Please let patient know that his prostate screening was elevated from his baseline.  However, the Levofloxacin should take care of this.  I would like him to come back and repeat the prostate lab 1 week after finishing the antibiotics.

## 2022-06-30 ENCOUNTER — Other Ambulatory Visit: Payer: Medicare HMO

## 2022-07-06 ENCOUNTER — Telehealth: Payer: Self-pay | Admitting: Nurse Practitioner

## 2022-07-06 NOTE — Telephone Encounter (Signed)
Contacted Aubert Okino to schedule their annual wellness visit. Appointment made for 07/25/2022.  Sherol Dade; [facility] Direct Dial: [phone]

## 2022-07-17 DIAGNOSIS — R972 Elevated prostate specific antigen [PSA]: Secondary | ICD-10-CM | POA: Insufficient documentation

## 2022-07-17 NOTE — Patient Instructions (Signed)
Eating Plan for Chronic Obstructive Pulmonary Disease Chronic obstructive pulmonary disease (COPD) causes symptoms such as shortness of breath, coughing, and chest discomfort. These symptoms can make it difficult to eat enough to maintain a healthy weight. Generally, people with COPD should eat a diet that is high in calories, protein, and other nutrients to maintain body weight and to keep the lungs as healthy as possible. Depending on the medicines you take and other health conditions you may have, your health care provider may give you additional recommendations on what to eat or avoid. Talk with your health care provider about your goals for body weight, and work with a dietitian to develop an eating plan that is right for you. What are tips for following this plan? Reading food labels  Avoid foods with more than 300 milligrams (mg) of salt (sodium) per serving. Choose foods that contain at least 4 grams (g) of fiber per serving. Try to eat 20-30 g of fiber each day. Choose foods that are high in calories and protein, such as nuts, beans, yogurt, and cheese. Shopping Do not buy foods labeled as diet, low-calorie, or low-fat. If you are able to eat dairy products: Avoid low-fat or skim milk. Buy dairy products that have at least 2% fat. Buy nutritional supplement drinks. Buy grains and prepared foods labeled as enriched or fortified. Consider buying low-sodium, pre-made foods to conserve energy for eating. Cooking Add dry milk or protein powder to smoothies. Cook with healthy fats, such as olive oil, canola oil, sunflower oil, and grapeseed oil. Add oil, butter, cream cheese, or nut butters to foods to increase fat and calories. To make foods easier to chew and swallow: Cook vegetables, pasta, and rice until soft. Cut or grind meat into very small pieces. Dip breads in liquid. Meal planning  Eat when you feel hungry. Eat 5-6 small meals throughout the day. Drink 6-8 glasses of water  each day. Do not drink liquids with meals. Drink liquids at the end of the meal to avoid feeling full too quickly. Eat a variety of fruits and vegetables every day. Ask for assistance from family or friends with planning and preparing meals as needed. Avoid foods that cause you to feel bloated, such as carbonated drinks, fried foods, beans, broccoli, cabbage, and apples. For older adults, ask your local agency on aging whether you are eligible for meal assistance programs, such as Meals on Wheels. Lifestyle  Do not smoke. Eat slowly. Take small bites and chew food well before swallowing. Do not overeat. This may make it more difficult to breathe after eating. Sit up while eating. If needed, continue to use supplemental oxygen while eating. Rest or relax for 30 minutes before and after eating. Monitor your weight as told by your health care provider. Exercise as told by your health care provider. What foods should I eat? Fruits All fresh, dried, canned, or frozen fruits that do not cause gas. Vegetables All fresh, canned (no salt added), or frozen vegetables that do not cause gas. Grains Whole-grain bread. Enriched whole-grain pasta. Fortified whole-grain cereals. Fortified rice. Quinoa. Meats and other proteins Lean meat. Poultry. Fish. Dried beans. Unsalted nuts. Tofu. Eggs. Nut butters. Dairy Whole or 2% milk. Cheese. Yogurt. Fats and oils Olive oil. Canola oil. Butter. Margarine. Beverages Water. Vegetable juice (no salt added). Decaffeinated coffee. Decaffeinated or herbal tea. Seasonings and condiments Fresh or dried herbs. Low-salt or salt-free seasonings. Low-sodium soy sauce. The items listed above may not be a complete list of foods  and beverages you can eat. Contact a dietitian for more information. What foods should I avoid? Fruits Fruits that cause gas, such as apples or melon. Vegetables Vegetables that cause gas, such as broccoli, Brussels sprouts, cabbage,  cauliflower, and onions. Canned vegetables with added salt. Meats and other proteins Fried meat. Salt-cured meat. Processed meat. Dairy Fat-free or low-fat milk, yogurt, or cheese. Processed cheese. Beverages Carbonated drinks. Caffeinated drinks, such as coffee, tea, and soft drinks. Juice. Alcohol. Vegetable juice with added salt. Seasonings and condiments Salt. Seasoning mixes with salt. Soy sauce. Angie Fava. Other foods Clear soup or broth. Fried foods. Prepared frozen meals. The items listed above may not be a complete list of foods and beverages you should avoid. Contact a dietitian for more information. Summary COPD symptoms can make it difficult to eat enough to maintain a healthy weight. A COPD eating plan can help you maintain your body weight and keep your lungs as healthy as possible. Eat a diet that is high in calories, protein, and other nutrients. Read labels to make sure that you are getting the right nutrients. Cook foods to make them easier to chew and swallow. Eat 5-6 small meals throughout the day, and avoid foods that cause gas or make you feel bloated. This information is not intended to replace advice given to you by your health care provider. Make sure you discuss any questions you have with your health care provider. Document Revised: 03/10/2020 Document Reviewed: 03/10/2020 Elsevier Patient Education  [place].

## 2022-07-21 ENCOUNTER — Ambulatory Visit (INDEPENDENT_AMBULATORY_CARE_PROVIDER_SITE_OTHER): Payer: Medicare HMO | Admitting: Nurse Practitioner

## 2022-07-21 ENCOUNTER — Telehealth: Payer: Self-pay

## 2022-07-21 ENCOUNTER — Encounter: Payer: Self-pay | Admitting: Nurse Practitioner

## 2022-07-21 VITALS — BP 113/70 | HR 82 | Temp 97.7°F | Ht 68.8 in | Wt 192.3 lb

## 2022-07-21 DIAGNOSIS — E559 Vitamin D deficiency, unspecified: Secondary | ICD-10-CM | POA: Diagnosis not present

## 2022-07-21 DIAGNOSIS — J432 Centrilobular emphysema: Secondary | ICD-10-CM

## 2022-07-21 DIAGNOSIS — I25118 Atherosclerotic heart disease of native coronary artery with other forms of angina pectoris: Secondary | ICD-10-CM | POA: Diagnosis not present

## 2022-07-21 DIAGNOSIS — I10 Essential (primary) hypertension: Secondary | ICD-10-CM | POA: Diagnosis not present

## 2022-07-21 DIAGNOSIS — R972 Elevated prostate specific antigen [PSA]: Secondary | ICD-10-CM | POA: Diagnosis not present

## 2022-07-21 DIAGNOSIS — R69 Illness, unspecified: Secondary | ICD-10-CM | POA: Diagnosis not present

## 2022-07-21 DIAGNOSIS — R911 Solitary pulmonary nodule: Secondary | ICD-10-CM | POA: Diagnosis not present

## 2022-07-21 DIAGNOSIS — Z0000 Encounter for general adult medical examination without abnormal findings: Secondary | ICD-10-CM | POA: Diagnosis not present

## 2022-07-21 DIAGNOSIS — I700 Atherosclerosis of aorta: Secondary | ICD-10-CM

## 2022-07-21 DIAGNOSIS — E782 Mixed hyperlipidemia: Secondary | ICD-10-CM | POA: Diagnosis not present

## 2022-07-21 DIAGNOSIS — F17210 Nicotine dependence, cigarettes, uncomplicated: Secondary | ICD-10-CM

## 2022-07-21 LAB — MICROALBUMIN, URINE WAIVED
Creatinine, Urine Waived: 200 mg/dL (ref 10–300)
Microalb, Ur Waived: 80 mg/L — ABNORMAL HIGH (ref 0–19)

## 2022-07-21 MED ORDER — LISINOPRIL-HYDROCHLOROTHIAZIDE 20-25 MG PO TABS: 1.0000 | ORAL_TABLET | Freq: Every day | ORAL | 4 refills | Status: DC

## 2022-07-21 MED ORDER — ATORVASTATIN CALCIUM 20 MG PO TABS: 20.0000 mg | ORAL_TABLET | Freq: Every day | ORAL | 4 refills | Status: DC

## 2022-07-21 MED ORDER — AMLODIPINE BESYLATE 5 MG PO TABS: 5.0000 mg | ORAL_TABLET | Freq: Every day | ORAL | 4 refills | Status: DC

## 2022-07-21 MED ORDER — BUPROPION HCL ER (SR) 150 MG PO TB12: ORAL_TABLET | ORAL | 1 refills | Status: DC

## 2022-07-21 NOTE — Assessment & Plan Note (Signed)
Non worrisome on report.  Continue annual lung screening and discussed with patient.  Recommend complete smoking cessation.

## 2022-07-21 NOTE — Progress Notes (Signed)
  Chronic Care Management   Note  07/21/2022 Name: Brandon Hart MRN: [id number] DOB: [birth year]  Brandon Hart is a 77 y.o. year old male who is a primary care patient of [name], [name], NP. I reached out to Brandon Hart by phone today in response to a referral sent by [name]'s PCP.  Brandon Hart was given information about Chronic Care Management services today including:  CCM service includes personalized support from designated clinical staff supervised by the physician, including individualized plan of care and coordination with other care providers 24/7 contact phone numbers for assistance for urgent and routine care needs. Service will only be billed when office clinical staff spend 20 minutes or more in a month to coordinate care. Only one practitioner may furnish and bill the service in a calendar month. The patient may stop CCM services at amy time (effective at the end of the month) by phone call to the office staff. The patient will be responsible for cost sharing (co-pay) or up to 20% of the service fee (after annual deductible is met)  Brandon Hart  agreedto scheduling an appointment with the CCM RN Case Manager   Follow up plan: Patient agreed to scheduled appointment with RN Case Manager on 07/25/2022 and Pharm d 07/26/2022(date/time).   Noreene Larsson, [place], [place] [address] Direct Dial: [phone] [email or web address]

## 2022-07-21 NOTE — Assessment & Plan Note (Signed)
Chronic, ongoing.  Continue current medication regimen and adjust as needed.  Lipid panel today.   Return in 6 months.

## 2022-07-21 NOTE — Assessment & Plan Note (Signed)
Wishes to trial Wellbutrin.  Discussed all smoking cessation options with him at length.  Educated on Wellbutrin and side effects to monitor for.  Return in 6 weeks for smoking cessation visit.

## 2022-07-21 NOTE — Assessment & Plan Note (Signed)
Chronic, ongoing.  Continue to recommend complete smoking cessation.  Unable to obtain inhaler maintenance as too costly will work with CCM team on this, new referral placed.  Would benefit LABA/LAMA with FEV1 59% and FEV1/FVC 97%.  Educated him at length on emphysema and progressive nature of disease.   Continue annual lung screening.  Recommend complete cessation of smoking.  Spirometry up to date, due next March 2025.

## 2022-07-21 NOTE — Assessment & Plan Note (Signed)
Chronic and stable with no recent CP.  Continue current medications and collaboration with cardiology as needed.

## 2022-07-21 NOTE — Assessment & Plan Note (Signed)
Chronic, stable.  BP at goal today.  Recommend he start checking BP at home three mornings a week and documenting for provider.  Discussed goal BP with him.  Continue current medication regimen and adjust as needed.  LABS: CMP, CBC, TSH, urine ALB. Recommend complete cessation of smoking.  Refills sent.  Return in 6 months.

## 2022-07-21 NOTE — Assessment & Plan Note (Signed)
Chronic.  Noted on lung CA CT screening, continue statin daily and recommend daily Baby ASA.  Recommend complete cessation smoking for prevention.

## 2022-07-21 NOTE — Assessment & Plan Note (Signed)
Chronic, recommend he take Vitamin D3 2000 units daily.  Recheck today.

## 2022-07-21 NOTE — Assessment & Plan Note (Signed)
6.3 in January 2024 with infection.  He declined prostate exam today.  Will recheck PSA, plan on urology referral if ongoing elevations above goal for age.

## 2022-07-21 NOTE — Progress Notes (Signed)
BP 113/70   Pulse 82   Temp 97.7 F (36.5 C) (Oral)   Ht 5' 8.8" (1.748 m)   Wt 192 lb 4.8 oz (87.2 kg)   SpO2 94%   BMI 28.56 kg/m    Subjective:    Patient ID: Brandon Hart, male    DOB: [birth year], 77 y.o.   MRN: [id number]  HPI: Brandon Hart is a 77 y.o. male presenting on 07/21/2022 for Medicare Wellness and comprehensive medical examination. Current medical complaints include:none  He currently lives with: self Interim Problems from his last visit: no   HYPERTENSION / HYPERLIPIDEMIA Taking Lipitor and Norvasc + Lisinopril-HCTZ.   Satisfied with current treatment? yes Duration of hypertension: chronic BP monitoring frequency: not checking BP range: not checking BP medication side effects: no Duration of hyperlipidemia: chronic Cholesterol medication side effects: no Cholesterol supplements: none Medication compliance: good compliance Aspirin: no Recent stressors: no Recurrent headaches: no Visual changes: no Palpitations: no Dyspnea: no Chest pain: no Lower extremity edema: no Dizzy/lightheaded: no    COPD Continues to smoke 1/2 pack per day, used to smoke 1 PPD.  He is interested in quitting and wishes to think about medications, we discussed options today.  Lung CT screening last 07/15/20 continuing to note centrilobular and paraseptal emphysema + aortic atherosclerosis.    Currently only uses Albuterol --- has not had to use yet.  Tried to place on Anoro and Stiolto in past, but too costly. COPD status: stable Satisfied with current treatment?: yes Oxygen use: no Dyspnea frequency: none Cough frequency: occasional per patient Rescue inhaler frequency:  none Limitation of activity: no Productive cough: none Last Spirometry: today 06/19/2019 == FEV1 59% and FEV1/FVC 97% today and previous FEV1 79% and FEV1/FVC 98% Pneumovax: Not up to Date -- refuses Influenza: Not Up to Date -- refuses  Functional Status Survey: Is the patient deaf or  have difficulty hearing?: No Does the patient have difficulty seeing, even when wearing glasses/contacts?: No Does the patient have difficulty concentrating, remembering, or making decisions?: No Does the patient have difficulty walking or climbing stairs?: No Does the patient have difficulty dressing or bathing?: No Does the patient have difficulty doing errands alone such as visiting a doctor's office or shopping?: No  FALL RISK:    07/21/2022    8:36 AM 06/13/2022   10:30 AM 01/19/2022    9:27 AM 07/20/2021    9:03 AM 06/30/2021   10:01 AM  Fall Risk   Falls in the past year? 0 0 0 0 0  Number falls in past yr: 0 0 0 0 0  Injury with Fall? 0 0 0 0 0  Risk for fall due to : No Fall Risks No Fall Risks No Fall Risks  No Fall Risks  Follow up Falls evaluation completed Falls evaluation completed Falls evaluation completed Falls evaluation completed;Falls prevention discussed Falls prevention discussed       07/21/2022    8:37 AM 06/13/2022   10:30 AM 01/19/2022    9:27 AM 07/20/2021    9:05 AM 06/30/2021   10:07 AM  Depression screen PHQ 2/9  Decreased Interest 0 0 0 0 1  Down, Depressed, Hopeless 0 0 0 0 1  PHQ - 2 Score 0 0 0 0 2  Altered sleeping 0 0 0 1 1  Tired, decreased energy 1 0 '1 1 1  '$ Change in appetite 0 0 0 0 1  Feeling bad or failure about yourself  0 0 0 0  1  Trouble concentrating 0 0 0 0 0  Moving slowly or fidgety/restless 0 0 0 0 1  Suicidal thoughts 0 0 0 0 0  PHQ-9 Score 1 0 '1 2 7  '$ Difficult doing work/chores Not difficult at all Not difficult at all Not difficult at all  Somewhat difficult      06/13/2022   10:30 AM 01/19/2022    9:27 AM 06/30/2021   10:08 AM  GAD 7 : Generalized Anxiety Score  Nervous, Anxious, on Edge 0 0 0  Control/stop worrying 0 0 0  Worry too much - different things 0 0 1  Trouble relaxing 0 0 0  Restless 0 0 0  Easily annoyed or irritable 0 0 0  Afraid - awful might happen 0 0 0  Total GAD 7 Score 0 0 1  Anxiety Difficulty Not  difficult at all Not difficult at all Not difficult at all   Advanced Directives A voluntary discussion about advance care planning including the explanation and discussion of advance directives was extensively discussed  with the patient for 15 minutes with patient.  Explanation about the health care proxy and Living will was reviewed and packet with forms with explanation of how to fill them out was given.  During this discussion, the patient was able to identify a health care proxy as his daughter and plans to fill out the paperwork required.  Patient was offered a separate [place] visit for further assistance with forms.     Past Medical History:  Past Medical History:  Diagnosis Date   Hypertension     Surgical History:  Past Surgical History:  Procedure Laterality Date   COLONOSCOPY WITH PROPOFOL N/A 04/11/2016   Procedure: COLONOSCOPY WITH PROPOFOL;  Surgeon: Jonathon Bellows, MD;  Location: [facility] ENDOSCOPY;  Service: Endoscopy;  Laterality: N/A;   HERNIA REPAIR     VASECTOMY      Medications:  Current Outpatient Medications on File Prior to Visit  Medication Sig   albuterol (PROAIR HFA) 108 (90 Base) MCG/ACT inhaler Inhale 2 puffs into the lungs every 6 (six) hours as needed for wheezing or shortness of breath.   No current facility-administered medications on file prior to visit.    Allergies:  No Known Allergies  Social History:  Social History   Socioeconomic History   Marital status: Single    Spouse name: Not on file   Number of children: Not on file   Years of education: Not on file   Highest education level: Not on file  Occupational History   Occupation: retired  Tobacco Use   Smoking status: Every Day    Packs/day: 0.50    Years: 50.00    Total pack years: 25.00    Types: Cigarettes   Smokeless tobacco: Never  Vaping Use   Vaping Use: Never used  Substance and Sexual Activity   Alcohol use: No    Comment: pt states he has not drink  anything in about a year   Drug use: No   Sexual activity: Yes  Other Topics Concern   Not on file  Social History Narrative   Not on file   Social Determinants of Health   Financial Resource Strain: Low Risk  (07/20/2021)   Overall Financial Resource Strain (CARDIA)    Difficulty of Paying Living Expenses: Not hard at all  Food Insecurity: No Food Insecurity (07/20/2021)   Hunger Vital Sign    Worried About Running Out of Food in the Last Year:  Never true    Ran Out of Food in the Last Year: Never true  Transportation Needs: No Transportation Needs (07/19/2021)   PRAPARE - Hydrologist (Medical): No    Lack of Transportation (Non-Medical): No  Physical Activity: Inactive (07/06/2020)   Exercise Vital Sign    Days of Exercise per Week: 0 days    Minutes of Exercise per Session: 0 min  Stress: No Stress Concern Present (07/20/2021)   [place]    Feeling of Stress : Not at all  Social Connections: Unknown (07/20/2021)   Social Connection and Isolation Panel [NHANES]    Frequency of Communication with Friends and Family: Twice a week    Frequency of Social Gatherings with Friends and Family: Once a week    Attends Religious Services: More than 4 times per year    Active Member of [employer] or Organizations: No    Attends Archivist Meetings: Never    Marital Status: Not on file  Intimate Partner Violence: Not At Risk (07/14/2017)   Humiliation, Afraid, Rape, and Kick questionnaire    Fear of Current or Ex-Partner: No    Emotionally Abused: No    Physically Abused: No    Sexually Abused: No   Social History   Tobacco Use  Smoking Status Every Day   Packs/day: 0.50   Years: 50.00   Total pack years: 25.00   Types: Cigarettes  Smokeless Tobacco Never   Social History   Substance and Sexual Activity  Alcohol Use No   Comment: pt states he has not drink anything in about a year     Family History:  Family History  Problem Relation Age of Onset   Hypertension Mother    Breast cancer Mother    Hypertension Father    Colon cancer Father    Hypertension Brother    Hypertension Brother     Past medical history, surgical history, medications, allergies, family history and social history reviewed with patient today and changes made to appropriate areas of the chart.   Review of Systems - negative All other ROS negative except what is listed above and in the HPI.      Objective:    BP 113/70   Pulse 82   Temp 97.7 F (36.5 C) (Oral)   Ht 5' 8.8" (1.748 m)   Wt 192 lb 4.8 oz (87.2 kg)   SpO2 94%   BMI 28.56 kg/m   Wt Readings from Last 3 Encounters:  07/21/22 192 lb 4.8 oz (87.2 kg)  06/13/22 190 lb 12.8 oz (86.5 kg)  01/19/22 194 lb 9.6 oz (88.3 kg)    Physical Exam Vitals and nursing note reviewed.  Constitutional:      General: He is awake. He is not in acute distress.    Appearance: He is well-developed. He is not ill-appearing.  HENT:     Head: Normocephalic and atraumatic.     Right Ear: Hearing, tympanic membrane, ear canal and external ear normal. No drainage.     Left Ear: Hearing, tympanic membrane, ear canal and external ear normal. No drainage.     Nose: Nose normal.     Mouth/Throat:     Pharynx: Uvula midline.  Eyes:     General: Lids are normal.        Right eye: No discharge.        Left eye: No discharge.     Extraocular Movements:  Extraocular movements intact.     Conjunctiva/sclera: Conjunctivae normal.     Pupils: Pupils are equal, round, and reactive to light.     Visual Fields: Right eye visual fields normal and left eye visual fields normal.  Neck:     Thyroid: No thyromegaly.     Vascular: No carotid bruit or JVD.     Trachea: Trachea normal.  Cardiovascular:     Rate and Rhythm: Normal rate and regular rhythm.     Heart sounds: Normal heart sounds, S1 normal and S2 normal. No murmur heard.    No gallop.   Pulmonary:     Effort: Pulmonary effort is normal. No accessory muscle usage or respiratory distress.     Breath sounds: Normal breath sounds.     Comments: Intermittent expiratory wheezes noted throughout.   Abdominal:     General: Bowel sounds are normal.     Palpations: Abdomen is soft. There is no hepatomegaly or splenomegaly.     Tenderness: There is no abdominal tenderness.  Genitourinary:    Comments: Deferred per patient request. Musculoskeletal:        General: Normal range of motion.     Cervical back: Normal range of motion and neck supple.     Right lower leg: No edema.     Left lower leg: No edema.  Lymphadenopathy:     Head:     Right side of head: No submental, submandibular, tonsillar, preauricular or posterior auricular adenopathy.     Left side of head: No submental, submandibular, tonsillar, preauricular or posterior auricular adenopathy.     Cervical: No cervical adenopathy.  Skin:    General: Skin is warm and dry.     Capillary Refill: Capillary refill takes less than 2 seconds.     Findings: No rash.  Neurological:     Mental Status: He is alert and oriented to person, place, and time.     Gait: Gait is intact.     Deep Tendon Reflexes: Reflexes are normal and symmetric.     Reflex Scores:      Brachioradialis reflexes are 2+ on the right side and 2+ on the left side.      Patellar reflexes are 2+ on the right side and 2+ on the left side. Psychiatric:        Attention and Perception: Attention normal.        Mood and Affect: Mood normal.        Speech: Speech normal.        Behavior: Behavior normal. Behavior is cooperative.        Thought Content: Thought content normal.        Cognition and Memory: Cognition normal.        Judgment: Judgment normal.       07/21/2022    8:54 AM 07/06/2020    3:18 PM 07/03/2019   10:58 AM 07/14/2017    1:12 PM  6CIT Screen  What Year? 0 points 0 points 0 points 0 points  What month? 0 points 0 points 0 points 0  points  What time? 0 points 0 points 0 points 0 points  Count back from 20 0 points 0 points 0 points 0 points  Months in reverse 2 points 4 points 0 points 0 points  Repeat phrase 2 points 4 points 0 points 2 points  Total Score 4 points 8 points 0 points 2 points     Results for orders placed or performed in visit on  06/13/22  PSA  Result Value Ref Range   Prostate Specific Ag, Serum 6.3 (H) 0.0 - 4.0 ng/mL  CBC w/Diff  Result Value Ref Range   WBC 9.6 3.4 - 10.8 x10E3/uL   RBC 4.78 4.14 - 5.80 x10E6/uL   Hemoglobin 14.2 13.0 - 17.7 g/dL   Hematocrit 41.9 37.5 - 51.0 %   MCV 88 79 - 97 fL   MCH 29.7 26.6 - 33.0 pg   MCHC 33.9 31.5 - 35.7 g/dL   RDW 12.4 11.6 - 15.4 %   Platelets 216 150 - 450 x10E3/uL   Neutrophils 61 Not Estab. %   Lymphs 29 Not Estab. %   Monocytes 7 Not Estab. %   Eos 2 Not Estab. %   Basos 1 Not Estab. %   Neutrophils Absolute 5.9 1.4 - 7.0 x10E3/uL   Lymphocytes Absolute 2.8 0.7 - 3.1 x10E3/uL   Monocytes Absolute 0.6 0.1 - 0.9 x10E3/uL   EOS (ABSOLUTE) 0.1 0.0 - 0.4 x10E3/uL   Basophils Absolute 0.1 0.0 - 0.2 x10E3/uL   Immature Granulocytes 0 Not Estab. %   Immature Grans (Abs) 0.0 0.0 - 0.1 x10E3/uL      Assessment & Plan:   Problem List Items Addressed This Visit       Cardiovascular and Mediastinum   Aortic atherosclerosis (HCC)    Chronic.  Noted on lung CA CT screening, continue statin daily and recommend daily Baby ASA.  Recommend complete cessation smoking for prevention.      Relevant Medications   amLODipine (NORVASC) 5 MG tablet   atorvastatin (LIPITOR) 20 MG tablet   lisinopril-hydrochlorothiazide (ZESTORETIC) 20-25 MG tablet   Other Relevant Orders   Comprehensive metabolic panel   Lipid Panel w/o Chol/HDL Ratio   Coronary artery disease of native artery of native heart with stable angina pectoris (HCC)    Chronic and stable with no recent CP.  Continue current medications and collaboration with cardiology as needed.       Relevant Medications   buPROPion (WELLBUTRIN SR) 150 MG 12 hr tablet   amLODipine (NORVASC) 5 MG tablet   atorvastatin (LIPITOR) 20 MG tablet   lisinopril-hydrochlorothiazide (ZESTORETIC) 20-25 MG tablet   Hypertension    Chronic, stable.  BP at goal today.  Recommend he start checking BP at home three mornings a week and documenting for provider.  Discussed goal BP with him.  Continue current medication regimen and adjust as needed.  LABS: CMP, CBC, TSH, urine ALB. Recommend complete cessation of smoking.  Refills sent.  Return in 6 months.      Relevant Medications   amLODipine (NORVASC) 5 MG tablet   atorvastatin (LIPITOR) 20 MG tablet   lisinopril-hydrochlorothiazide (ZESTORETIC) 20-25 MG tablet   Other Relevant Orders   CBC with Differential/Platelet   Comprehensive metabolic panel   TSH   Microalbumin, Urine Waived     Respiratory   Centrilobular emphysema (HCC)    Chronic, ongoing.  Continue to recommend complete smoking cessation.  Unable to obtain inhaler maintenance as too costly will work with CCM team on this, new referral placed.  Would benefit LABA/LAMA with FEV1 59% and FEV1/FVC 97%.  Educated him at length on emphysema and progressive nature of disease.   Continue annual lung screening.  Recommend complete cessation of smoking.  Spirometry up to date, due next March 2025.      Relevant Orders   CBC with Differential/Platelet   Spirometry with Graph (Completed)   AMB Referral to Chronic Care Management Services  Pulmonary nodule    Non worrisome on report.  Continue annual lung screening and discussed with patient.  Recommend complete smoking cessation.        Other   Elevated PSA measurement    6.3 in January 2024 with infection.  He declined prostate exam today.  Will recheck PSA, plan on urology referral if ongoing elevations above goal for age.      Relevant Orders   PSA, total and free   Hyperlipidemia    Chronic, ongoing.  Continue current medication  regimen and adjust as needed.  Lipid panel today.   Return in 6 months.      Relevant Medications   amLODipine (NORVASC) 5 MG tablet   atorvastatin (LIPITOR) 20 MG tablet   lisinopril-hydrochlorothiazide (ZESTORETIC) 20-25 MG tablet   Other Relevant Orders   Comprehensive metabolic panel   Lipid Panel w/o Chol/HDL Ratio   Nicotine dependence, cigarettes, uncomplicated    Wishes to trial Wellbutrin.  Discussed all smoking cessation options with him at length.  Educated on Wellbutrin and side effects to monitor for.  Return in 6 weeks for smoking cessation visit.      Vitamin D deficiency    Chronic, recommend he take Vitamin D3 2000 units daily.  Recheck today.      Relevant Orders   VITAMIN D 25 Hydroxy (Vit-D Deficiency, Fractures)   Other Visit Diagnoses     Medicare annual wellness visit, subsequent    -  Primary   Due for Medicare Wellness, performed today with patient.   Encounter for annual physical exam       Annual physical today with labs and health maintenance reviewed, discussed with patient.       Discussed aspirin prophylaxis for myocardial infarction prevention and decision was made to start ASA  LABORATORY TESTING:  Health maintenance labs ordered today as discussed above.   The natural history of prostate cancer and ongoing controversy regarding screening and potential treatment outcomes of prostate cancer has been discussed with the patient. The meaning of a false positive PSA and a false negative PSA has been discussed. He indicates understanding of the limitations of this screening test and wishes to proceed with screening PSA testing.  He is aware of guideline recommendations, stopping at age 11, he agrees with this plan, however recent elevations -- plan recheck today.  IMMUNIZATIONS:   - Tdap: Tetanus vaccination status reviewed: refuses. - Influenza: Refused - Pneumovax: Refused - Prevnar: Refused - Zostavax vaccine: Refused  SCREENING: -  Colonoscopy: Up to date  Discussed with patient purpose of the colonoscopy is to detect colon cancer at curable precancerous or early stages   - AAA Screening: Refused  -Hearing Test: Not applicable  -Spirometry: Done today  PATIENT COUNSELING:    Sexuality: Discussed sexually transmitted diseases, partner selection, use of condoms, avoidance of unintended pregnancy  and contraceptive alternatives.   Advised to avoid cigarette smoking.  I discussed with the patient that most people either abstain from alcohol or drink within safe limits (<=14/week and <=4 drinks/occasion for males, <=7/weeks and <= 3 drinks/occasion for females) and that the risk for alcohol disorders and other health effects rises proportionally with the number of drinks per week and how often a drinker exceeds daily limits.  Discussed cessation/primary prevention of drug use and availability of treatment for abuse.   Diet: Encouraged to adjust caloric intake to maintain  or achieve ideal body weight, to reduce intake of dietary saturated fat and total fat, to limit sodium  intake by avoiding high sodium foods and not adding table salt, and to maintain adequate dietary potassium and calcium preferably from fresh fruits, vegetables, and low-fat dairy products.    Stressed the importance of regular exercise  Injury prevention: Discussed safety belts, safety helmets, smoke detector, smoking near bedding or upholstery.   Dental health: Discussed importance of regular tooth brushing, flossing, and dental visits.   Follow up plan: NEXT PREVENTATIVE PHYSICAL DUE IN 1 YEAR. Return in about 6 weeks (around 09/01/2022) for Smoking Cessation and COPD -- added Wellbutrin.

## 2022-07-22 ENCOUNTER — Other Ambulatory Visit: Payer: Self-pay | Admitting: Nurse Practitioner

## 2022-07-22 ENCOUNTER — Ambulatory Visit
Admission: RE | Admit: 2022-07-22 | Discharge: 2022-07-22 | Disposition: A | Discharge status: Home / Self Care | Payer: Medicare HMO | Source: Ambulatory Visit | Attending: Nurse Practitioner | Admitting: Nurse Practitioner

## 2022-07-22 DIAGNOSIS — F17210 Nicotine dependence, cigarettes, uncomplicated: Secondary | ICD-10-CM | POA: Diagnosis not present

## 2022-07-22 DIAGNOSIS — Z122 Encounter for screening for malignant neoplasm of respiratory organs: Secondary | ICD-10-CM | POA: Diagnosis not present

## 2022-07-22 DIAGNOSIS — R69 Illness, unspecified: Secondary | ICD-10-CM | POA: Diagnosis not present

## 2022-07-22 DIAGNOSIS — Z87891 Personal history of nicotine dependence: Secondary | ICD-10-CM | POA: Diagnosis not present

## 2022-07-22 DIAGNOSIS — R972 Elevated prostate specific antigen [PSA]: Secondary | ICD-10-CM

## 2022-07-22 DIAGNOSIS — I2510 Atherosclerotic heart disease of native coronary artery without angina pectoris: Secondary | ICD-10-CM | POA: Diagnosis not present

## 2022-07-22 DIAGNOSIS — I700 Atherosclerosis of aorta: Secondary | ICD-10-CM | POA: Diagnosis not present

## 2022-07-22 DIAGNOSIS — J432 Centrilobular emphysema: Secondary | ICD-10-CM | POA: Diagnosis not present

## 2022-07-22 LAB — LIPID PANEL W/O CHOL/HDL RATIO
Cholesterol, Total: 138 mg/dL (ref 100–199)
HDL: 31 mg/dL — ABNORMAL LOW (ref >39)
LDL Chol Calc (NIH): 72 mg/dL (ref 0–99)
Triglycerides: 207 mg/dL — ABNORMAL HIGH (ref 0–149)
VLDL Cholesterol Cal: 35 mg/dL (ref 5–40)

## 2022-07-22 LAB — CBC WITH DIFFERENTIAL/PLATELET
Basophils Absolute: 0.1 10*3/uL (ref 0.0–0.2)
Basos: 1 %
EOS (ABSOLUTE): 0.2 10*3/uL (ref 0.0–0.4)
Eos: 2 %
Hematocrit: 41.7 % (ref 37.5–51.0)
Hemoglobin: 14 g/dL (ref 13.0–17.7)
Immature Grans (Abs): 0 10*3/uL (ref 0.0–0.1)
Immature Granulocytes: 0 %
Lymphocytes Absolute: 2.8 10*3/uL (ref 0.7–3.1)
Lymphs: 29 %
MCH: 29.6 pg (ref 26.6–33.0)
MCHC: 33.6 g/dL (ref 31.5–35.7)
MCV: 88 fL (ref 79–97)
Monocytes Absolute: 0.7 10*3/uL (ref 0.1–0.9)
Monocytes: 7 %
Neutrophils Absolute: 5.9 10*3/uL (ref 1.4–7.0)
Neutrophils: 61 %
Platelets: 217 10*3/uL (ref 150–450)
RBC: 4.73 x10E6/uL (ref 4.14–5.80)
RDW: 12.7 % (ref 11.6–15.4)
WBC: 9.7 10*3/uL (ref 3.4–10.8)

## 2022-07-22 LAB — COMPREHENSIVE METABOLIC PANEL
ALT: 18 IU/L (ref 0–44)
AST: 16 IU/L (ref 0–40)
Albumin/Globulin Ratio: 1.9 (ref 1.2–2.2)
Albumin: 4.6 g/dL (ref 3.8–4.8)
Alkaline Phosphatase: 109 IU/L (ref 44–121)
BUN/Creatinine Ratio: 17 (ref 10–24)
BUN: 22 mg/dL (ref 8–27)
Bilirubin Total: 0.4 mg/dL (ref 0.0–1.2)
CO2: 25 mmol/L (ref 20–29)
Calcium: 9.5 mg/dL (ref 8.6–10.2)
Chloride: 100 mmol/L (ref 96–106)
Creatinine, Ser: 1.26 mg/dL (ref 0.76–1.27)
Globulin, Total: 2.4 g/dL (ref 1.5–4.5)
Glucose: 106 mg/dL — ABNORMAL HIGH (ref 70–99)
Potassium: 4.4 mmol/L (ref 3.5–5.2)
Sodium: 140 mmol/L (ref 134–144)
Total Protein: 7 g/dL (ref 6.0–8.5)
eGFR: 60 mL/min/{1.73_m2} (ref >59)

## 2022-07-22 LAB — VITAMIN D 25 HYDROXY (VIT D DEFICIENCY, FRACTURES): Vit D, 25-Hydroxy: 43.7 ng/mL (ref 30.0–100.0)

## 2022-07-22 LAB — PSA, TOTAL AND FREE
PSA, Free Pct: 26 %
PSA, Free: 2.08 ng/mL
Prostate Specific Ag, Serum: 8 ng/mL — ABNORMAL HIGH (ref 0.0–4.0)

## 2022-07-22 LAB — TSH: TSH: 2.6 u[IU]/mL (ref 0.450–4.500)

## 2022-07-22 NOTE — Progress Notes (Signed)
Scheduled labs only visit for 08/19/2022 @ 10 am.

## 2022-07-22 NOTE — Progress Notes (Signed)
Good morning, please let Brandon Hart know his labs have returned.  He will need a lab only visit in 4 weeks scheduled please. LABS: - Kidney and liver function remain nice and stable. - Cholesterol labs show stable LDL, bad cholesterol, continue Atorvastatin daily.  However, triglycerides remain a little elevated.  I do recommend taking Omega 3 or fish oil supplement daily and we will recheck next visit fasting labs.  You can obtain these supplements over the counter. - CBC shows no anemia or infection. - PSA, prostate blood work is trending up from 6.3 to now 8.  I would like to recheck this on outpatient labs in 4 weeks and if still elevated would like to send you to urology for further assessment.  Any questions about this? Keep being awesome!!  Thank you for allowing me to participate in your care.  I appreciate you. Kindest regards, Mubarak Bevens

## 2022-07-25 ENCOUNTER — Ambulatory Visit (INDEPENDENT_AMBULATORY_CARE_PROVIDER_SITE_OTHER): Payer: Medicare HMO

## 2022-07-25 ENCOUNTER — Telehealth: Payer: Self-pay

## 2022-07-25 ENCOUNTER — Telehealth: Payer: Medicare HMO

## 2022-07-25 ENCOUNTER — Other Ambulatory Visit: Payer: Self-pay | Admitting: Acute Care

## 2022-07-25 VITALS — Ht 69.0 in | Wt 192.0 lb

## 2022-07-25 DIAGNOSIS — Z0000 Encounter for general adult medical examination without abnormal findings: Secondary | ICD-10-CM | POA: Diagnosis not present

## 2022-07-25 DIAGNOSIS — Z87891 Personal history of nicotine dependence: Secondary | ICD-10-CM

## 2022-07-25 DIAGNOSIS — Z122 Encounter for screening for malignant neoplasm of respiratory organs: Secondary | ICD-10-CM

## 2022-07-25 DIAGNOSIS — F17210 Nicotine dependence, cigarettes, uncomplicated: Secondary | ICD-10-CM

## 2022-07-25 NOTE — Telephone Encounter (Signed)
   CCM RN Visit Note   07-25-2022 Name: [name] MRN: [id number]      DOB: 1946-02-27  Subjective: Brandon Hart is a 77 y.o. year old male who is a primary care patient of Orion Crook. The patient was referred to the Chronic Care Management team for assistance with care management needs subsequent to provider initiation of CCM services and plan of care.      An unsuccessful telephone outreach was attempted today to contact the patient about Chronic Care Management needs.    Plan:The care management team will reach out to the patient again over the next 30 days.  Noreene Larsson RN, MSN, CCM RN Care Manager  Chronic Care Management Direct Number: [phone]

## 2022-07-25 NOTE — Patient Instructions (Signed)
[name] , Thank you for taking time to come for your Medicare Wellness Visit. I appreciate your ongoing commitment to your health goals. Please review the following plan we discussed and let me know if I can assist you in the future.   These are the goals we discussed:  Goals      DIET - EAT MORE FRUITS AND VEGETABLES     Patient Stated     07/06/2020, no goals     Patient Stated     No goals     Quit Smoking     Smoking cessation discussed     Quit smoking / using tobacco        This is a list of the screening recommended for you and due dates:  Health Maintenance  Topic Date Due   Flu Shot  08/14/2022*   Zoster (Shingles) Vaccine (1 of 2) 10/21/2022*   Pneumonia Vaccine (1 of 2 - PCV) 07/21/2023*   Screening for Lung Cancer  07/22/2023   Medicare Annual Wellness Visit  07/25/2023   Colon Cancer Screening  04/11/2026   Hepatitis C Screening: USPSTF Recommendation to screen - Ages 18-79 yo.  Completed   HPV Vaccine  Aged Out   DTaP/Tdap/Td vaccine  Discontinued   COVID-19 Vaccine  Discontinued  *Topic was postponed. The date shown is not the original due date.    Advanced directives: no  Conditions/risks identified: none  Next appointment: Follow up in one year for your annual wellness visit. 07/31/23 @ 2:00 pm by phone  Preventive Care 65 Years and Older, Male  Preventive care refers to lifestyle choices and visits with your health care provider that can promote health and wellness. What does preventive care include? A yearly physical exam. This is also called an annual well check. Dental exams once or twice a year. Routine eye exams. Ask your health care provider how often you should have your eyes checked. Personal lifestyle choices, including: Daily care of your teeth and gums. Regular physical activity. Eating a healthy diet. Avoiding tobacco and drug use. Limiting alcohol use. Practicing safe sex. Taking low doses of aspirin every day. Taking  vitamin and mineral supplements as recommended by your health care provider. What happens during an annual well check? The services and screenings done by your health care provider during your annual well check will depend on your age, overall health, lifestyle risk factors, and family history of disease. Counseling  Your health care provider may ask you questions about your: Alcohol use. Tobacco use. Drug use. Emotional well-being. Home and relationship well-being. Sexual activity. Eating habits. History of falls. Memory and ability to understand (cognition). Work and work Statistician. Screening  You may have the following tests or measurements: Height, weight, and BMI. Blood pressure. Lipid and cholesterol levels. These may be checked every 5 years, or more frequently if you are over 37 years old. Skin check. Lung cancer screening. You may have this screening every year starting at age 30 if you have a 30-pack-year history of smoking and currently smoke or have quit within the past 15 years. Fecal occult blood test (FOBT) of the stool. You may have this test every year starting at age 83. Flexible sigmoidoscopy or colonoscopy. You may have a sigmoidoscopy every 5 years or a colonoscopy every 10 years starting at age 70. Prostate cancer screening. Recommendations will vary depending on your family history and other risks. Hepatitis C blood test. Hepatitis B blood test. Sexually transmitted disease (STD) testing. Diabetes screening.  This is done by checking your blood sugar (glucose) after you have not eaten for a while (fasting). You may have this done every 1-3 years. Abdominal aortic aneurysm (AAA) screening. You may need this if you are a current or former smoker. Osteoporosis. You may be screened starting at age 74 if you are at high risk. Talk with your health care provider about your test results, treatment options, and if necessary, the need for more tests. Vaccines  Your  health care provider may recommend certain vaccines, such as: Influenza vaccine. This is recommended every year. Tetanus, diphtheria, and acellular pertussis (Tdap, Td) vaccine. You may need a Td booster every 10 years. Zoster vaccine. You may need this after age 72. Pneumococcal 13-valent conjugate (PCV13) vaccine. One dose is recommended after age 91. Pneumococcal polysaccharide (PPSV23) vaccine. One dose is recommended after age 79. Talk to your health care provider about which screenings and vaccines you need and how often you need them. This information is not intended to replace advice given to you by your health care provider. Make sure you discuss any questions you have with your health care provider. Document Released: 05/29/2015 Document Revised: 01/20/2016 Document Reviewed: 03/03/2015 Elsevier Interactive Patient Education  2017 [place] Prevention in the Home Falls can cause injuries. They can happen to people of all ages. There are many things you can do to make your home safe and to help prevent falls. What can I do on the outside of my home? Regularly fix the edges of walkways and driveways and fix any cracks. Remove anything that might make you trip as you walk through a door, such as a raised step or threshold. Trim any bushes or trees on the path to your home. Use bright outdoor lighting. Clear any walking paths of anything that might make someone trip, such as rocks or tools. Regularly check to see if handrails are loose or broken. Make sure that both sides of any steps have handrails. Any raised decks and porches should have guardrails on the edges. Have any leaves, snow, or ice cleared regularly. Use sand or salt on walking paths during winter. Clean up any spills in your garage right away. This includes oil or grease spills. What can I do in the bathroom? Use night lights. Install grab bars by the toilet and in the tub and shower. Do not use towel bars as  grab bars. Use non-skid mats or decals in the tub or shower. If you need to sit down in the shower, use a plastic, non-slip stool. Keep the floor dry. Clean up any water that spills on the floor as soon as it happens. Remove soap buildup in the tub or shower regularly. Attach bath mats securely with double-sided non-slip rug tape. Do not have throw rugs and other things on the floor that can make you trip. What can I do in the bedroom? Use night lights. Make sure that you have a light by your bed that is easy to reach. Do not use any sheets or blankets that are too big for your bed. They should not hang down onto the floor. Have a firm chair that has side arms. You can use this for support while you get dressed. Do not have throw rugs and other things on the floor that can make you trip. What can I do in the kitchen? Clean up any spills right away. Avoid walking on wet floors. Keep items that you use a lot in easy-to-reach places. If  you need to reach something above you, use a strong step stool that has a grab bar. Keep electrical cords out of the way. Do not use floor polish or wax that makes floors slippery. If you must use wax, use non-skid floor wax. Do not have throw rugs and other things on the floor that can make you trip. What can I do with my stairs? Do not leave any items on the stairs. Make sure that there are handrails on both sides of the stairs and use them. Fix handrails that are broken or loose. Make sure that handrails are as long as the stairways. Check any carpeting to make sure that it is firmly attached to the stairs. Fix any carpet that is loose or worn. Avoid having throw rugs at the top or bottom of the stairs. If you do have throw rugs, attach them to the floor with carpet tape. Make sure that you have a light switch at the top of the stairs and the bottom of the stairs. If you do not have them, ask someone to add them for you. What else can I do to help prevent  falls? Wear shoes that: Do not have high heels. Have rubber bottoms. Are comfortable and fit you well. Are closed at the toe. Do not wear sandals. If you use a stepladder: Make sure that it is fully opened. Do not climb a closed stepladder. Make sure that both sides of the stepladder are locked into place. Ask someone to hold it for you, if possible. Clearly mark and make sure that you can see: Any grab bars or handrails. First and last steps. Where the edge of each step is. Use tools that help you move around (mobility aids) if they are needed. These include: Canes. Walkers. Scooters. Crutches. Turn on the lights when you go into a dark area. Replace any light bulbs as soon as they burn out. Set up your furniture so you have a clear path. Avoid moving your furniture around. If any of your floors are uneven, fix them. If there are any pets around you, be aware of where they are. Review your medicines with your doctor. Some medicines can make you feel dizzy. This can increase your chance of falling. Ask your doctor what other things that you can do to help prevent falls. This information is not intended to replace advice given to you by your health care provider. Make sure you discuss any questions you have with your health care provider. Document Released: 02/26/2009 Document Revised: 10/08/2015 Document Reviewed: 06/06/2014 Elsevier Interactive Patient Education  2017 [employer].

## 2022-07-25 NOTE — Progress Notes (Signed)
I connected with  [name] on 07/25/22 by a audio enabled telemedicine application and verified that I am speaking with the correct person using two identifiers.  Patient Location: Home  Provider Location: Office/Clinic  I discussed the limitations of evaluation and management by telemedicine. The patient expressed understanding and agreed to proceed.  Subjective:   Brandon Hart is a 77 y.o. male who presents for Medicare Annual/Subsequent preventive examination.  Review of Systems     Cardiac Risk Factors include: advanced age (>39mn, >>44women);hypertension;male gender;sedentary lifestyle     Objective:    There were no vitals filed for this visit. There is no height or weight on file to calculate BMI.     07/25/2022    2:06 PM 07/20/2021    9:04 AM 07/06/2020    3:16 PM 07/03/2019   11:00 AM 07/14/2017    1:07 PM 07/13/2016    1:06 PM 04/11/2016    9:38 AM  Advanced Directives  Does Patient Have a Medical Advance Directive? No No No No No No No  Would patient like information on creating a medical advance directive? No - Patient declined No - Patient declined   No - Patient declined  No - Patient declined    Current Medications (verified) Outpatient Encounter Medications as of 07/25/2022  Medication Sig   albuterol (PROAIR HFA) 108 (90 Base) MCG/ACT inhaler Inhale 2 puffs into the lungs every 6 (six) hours as needed for wheezing or shortness of breath.   amLODipine (NORVASC) 5 MG tablet Take 1 tablet (5 mg total) by mouth daily.   atorvastatin (LIPITOR) 20 MG tablet Take 1 tablet (20 mg total) by mouth daily.   buPROPion (WELLBUTRIN SR) 150 MG 12 hr tablet Take 150 MG (one tablet) once daily by mouth for 3 days; then increase to 150 MG (one tablet) twice daily by mouth.   lisinopril-hydrochlorothiazide (ZESTORETIC) 20-25 MG tablet Take 1 tablet by mouth daily.   No facility-administered encounter medications on file as of 07/25/2022.    Allergies  (verified) Patient has no known allergies.   History: Past Medical History:  Diagnosis Date   Hypertension    Past Surgical History:  Procedure Laterality Date   COLONOSCOPY WITH PROPOFOL N/A 04/11/2016   Procedure: COLONOSCOPY WITH PROPOFOL;  Surgeon: KJonathon Bellows MD;  Location: [facility] END[facility]COPY;  Service: Endoscopy;  Laterality: N/A;   HERNIA REPAIR     VASECTOMY     Family History  Problem Relation Age of Onset   Hypertension Mother    Breast cancer Mother    Hypertension Father    Colon cancer Father    Hypertension Brother    Hypertension Brother    Social History   Socioeconomic History   Marital status: Single    Spouse name: Not on file   Number of children: Not on file   Years of education: Not on file   Highest education level: Not on file  Occupational History   Occupation: retired  Tobacco Use   Smoking status: Every Day    Packs/day: 0.50    Years: 50.00    Total pack years: 25.00    Types: Cigarettes   Smokeless tobacco: Never  Vaping Use   Vaping Use: Never used  Substance and Sexual Activity   Alcohol use: No    Comment: pt states he has not drink anything in about a year   Drug use: No   Sexual activity: Yes  Other Topics Concern   Not on file  Social History Narrative   Not on file   Social Determinants of Health   Financial Resource Strain: Low Risk  (07/25/2022)   Overall Financial Resource Strain (CARDIA)    Difficulty of Paying Living Expenses: Not hard at all  Food Insecurity: No Food Insecurity (07/25/2022)   Hunger Vital Sign    Worried About Running Out of Food in the Last Year: Never true    Ran Out of Food in the Last Year: Never true  Transportation Needs: No Transportation Needs (07/25/2022)   PRAPARE - Hydrologist (Medical): No    Lack of Transportation (Non-Medical): No  Physical Activity: Insufficiently Active (07/25/2022)   Exercise Vital Sign    Days of Exercise per Week: 2 days    Minutes of  Exercise per Session: 20 min  Stress: No Stress Concern Present (07/25/2022)   [place]    Feeling of Stress : Not at all  Social Connections: Moderately Isolated (07/25/2022)   Social Connection and Isolation Panel [NHANES]    Frequency of Communication with Friends and Family: Twice a week    Frequency of Social Gatherings with Friends and Family: Once a week    Attends Religious Services: More than 4 times per year    Active Member of [employer] or Organizations: No    Attends Music therapist: Never    Marital Status: Divorced    Tobacco Counseling Ready to quit: Not Answered Counseling given: Not Answered   Clinical Intake:  Pre-visit preparation completed: Yes  Pain : No/denies pain     Nutritional Risks: None Diabetes: No  How often do you need to have someone help you when you read instructions, pamphlets, or other written materials from your doctor or pharmacy?: 1 - Never  Diabetic?no  Interpreter Needed?: No  Information entered by :: Kirke Shaggy, LPN   Activities of Daily Living    07/25/2022    2:07 PM 07/21/2022    8:46 AM  In your present state of health, do you have any difficulty performing the following activities:  Hearing? 0 0  Vision? 0 0  Difficulty concentrating or making decisions? 0 0  Walking or climbing stairs? 0 0  Dressing or bathing? 0 0  Doing errands, shopping? 0 0  Preparing Food and eating ? N   Using the Toilet? N   In the past six months, have you accidently leaked urine? N   Do you have problems with loss of bowel control? N   Managing your Medications? N   Managing your Finances? N   Housekeeping or managing your Housekeeping? N     Patient Care Team: Venita Lick, NP as PCP - General (Nurse Practitioner) Madelin Rear, [facility] (Inactive) (Pharmacist)  Indicate any recent Medical Services you may have received from other than [facility] providers in  the past year (date may be approximate).     Assessment:   This is a routine wellness examination for [facility].  Hearing/Vision screen Hearing Screening - Comments:: No aids Vision Screening - Comments:: readers  Dietary issues and exercise activities discussed: Current Exercise Habits: Home exercise routine, Type of exercise: walking, Time (Minutes): 20, Frequency (Times/Week): 2, Weekly Exercise (Minutes/Week): 40, Intensity: Mild   Goals Addressed             This Visit's Progress    DIET - EAT MORE FRUITS AND VEGETABLES         Depression Screen  07/25/2022    2:05 PM 07/21/2022    8:37 AM 06/13/2022   10:30 AM 01/19/2022    9:27 AM 07/20/2021    9:05 AM 06/30/2021   10:07 AM 07/06/2020    3:17 PM  PHQ 2/9 Scores  PHQ - 2 Score 0 0 0 0 0 2 0  PHQ- 9 Score 0 1 0 '1 2 7     '$ Fall Risk    07/25/2022    2:07 PM 07/21/2022    8:36 AM 06/13/2022   10:30 AM 01/19/2022    9:27 AM 07/20/2021    9:03 AM  Fall Risk   Falls in the past year? 0 0 0 0 0  Number falls in past yr: 0 0 0 0 0  Injury with Fall? 0 0 0 0 0  Risk for fall due to : No Fall Risks No Fall Risks No Fall Risks No Fall Risks   Follow up Falls prevention discussed;Falls evaluation completed Falls evaluation completed Falls evaluation completed Falls evaluation completed Falls evaluation completed;Falls prevention discussed    FALL RISK PREVENTION PERTAINING TO THE HOME:  Any stairs in or around the home? Yes  If so, are there any without handrails? No  Home free of loose throw rugs in walkways, pet beds, electrical cords, etc? Yes  Adequate lighting in your home to reduce risk of falls? Yes   ASSISTIVE DEVICES UTILIZED TO PREVENT FALLS:  Life alert? No  Use of a cane, walker or w/c? No  Grab bars in the bathroom? No  Shower chair or bench in shower? No  Elevated toilet seat or a handicapped toilet? No     Cognitive Function:        07/25/2022    2:11 PM 07/21/2022    8:54 AM 07/06/2020    3:18 PM  07/03/2019   10:58 AM 07/14/2017    1:12 PM  6CIT Screen  What Year? 0 points 0 points 0 points 0 points 0 points  What month? 0 points 0 points 0 points 0 points 0 points  What time? 0 points 0 points 0 points 0 points 0 points  Count back from 20 0 points 0 points 0 points 0 points 0 points  Months in reverse 4 points 2 points 4 points 0 points 0 points  Repeat phrase 4 points 2 points 4 points 0 points 2 points  Total Score 8 points 4 points 8 points 0 points 2 points    Immunizations  There is no immunization history on file for this patient.  TDAP status: Due, Education has been provided regarding the importance of this vaccine. Advised may receive this vaccine at local pharmacy or Health Dept. Aware to provide a copy of the vaccination record if obtained from local pharmacy or Health Dept. Verbalized acceptance and understanding.  Flu Vaccine status: Declined, Education has been provided regarding the importance of this vaccine but patient still declined. Advised may receive this vaccine at local pharmacy or Health Dept. Aware to provide a copy of the vaccination record if obtained from local pharmacy or Health Dept. Verbalized acceptance and understanding.  Pneumococcal vaccine status: Declined,  Education has been provided regarding the importance of this vaccine but patient still declined. Advised may receive this vaccine at local pharmacy or Health Dept. Aware to provide a copy of the vaccination record if obtained from local pharmacy or Health Dept. Verbalized acceptance and understanding.   Covid-19 vaccine status: Declined, Education has been provided regarding the importance of this  vaccine but patient still declined. Advised may receive this vaccine at local pharmacy or Health Dept.or vaccine clinic. Aware to provide a copy of the vaccination record if obtained from local pharmacy or Health Dept. Verbalized acceptance and understanding.  Qualifies for Shingles Vaccine? Yes    Zostavax completed No   Shingrix Completed?: No.    Education has been provided regarding the importance of this vaccine. Patient has been advised to call insurance company to determine out of pocket expense if they have not yet received this vaccine. Advised may also receive vaccine at local pharmacy or Health Dept. Verbalized acceptance and understanding.  Screening Tests Health Maintenance  Topic Date Due   INFLUENZA VACCINE  08/14/2022 (Originally 12/14/2021)   Zoster Vaccines- Shingrix (1 of 2) 10/21/2022 (Originally 01/02/1998)   Pneumonia Vaccine 59+ Years old (1 of 2 - PCV) 07/21/2023 (Originally 01/02/1952)   Lung Cancer Screening  07/22/2023   Medicare Annual Wellness (AWV)  07/25/2023   COLONOSCOPY (Pts 45-68yr Insurance coverage will need to be confirmed)  04/11/2026   Hepatitis C Screening  Completed   HPV VACCINES  Aged Out   DTaP/Tdap/Td  Discontinued   COVID-19 Vaccine  Discontinued    Health Maintenance  There are no preventive care reminders to display for this patient.  Colorectal cancer screening: Type of screening: Colonoscopy. Completed 04/11/16. Repeat every 10 years  Lung Cancer Screening: (Low Dose CT Chest recommended if Age 75-80years, 30 pack-year currently smoking OR have quit w/in 15years.) does qualify.   Lung Cancer Screening Referral: ordered 07/25/22  Additional Screening:  Hepatitis C Screening: does qualify; Completed 08/10/15  Vision Screening: Recommended annual ophthalmology exams for early detection of glaucoma and other disorders of the eye. Is the patient up to date with their annual eye exam?  No  Who is the provider or what is the name of the office in which the patient attends annual eye exams? No one If pt is not established with a provider, would they like to be referred to a provider to establish care? No .   Dental Screening: Recommended annual dental exams for proper oral hygiene  Community Resource Referral / Chronic Care  Management: CRR required this visit?  No   CCM required this visit?  No      Plan:     I have personally reviewed and noted the following in the patient's chart:   Medical and social history Use of alcohol, tobacco or illicit drugs  Current medications and supplements including opioid prescriptions. Patient is not currently taking opioid prescriptions. Functional ability and status Nutritional status Physical activity Advanced directives List of other physicians Hospitalizations, surgeries, and ER visits in previous 12 months Vitals Screenings to include cognitive, depression, and falls Referrals and appointments  In addition, I have reviewed and discussed with patient certain preventive protocols, quality metrics, and best practice recommendations. A written personalized care plan for preventive services as well as general preventive health recommendations were provided to patient.     LDionisio David LPN   [id number]  Nurse Notes: none

## 2022-07-25 NOTE — Telephone Encounter (Signed)
   CCM RN Visit Note   07-25-2022 Name: Brandon Hart MRN: [id number]      DOB: [birth year]  Subjective: Brandon Hart is a 77 y.o. year old male who is a primary care patient of Marnee Guarneri, NP The patient was referred to the Chronic Care Management team for assistance with care management needs subsequent to provider initiation of CCM services and plan of care.      An unsuccessful telephone outreach was attempted today to contact the patient about Chronic Care Management needs.  Was able to talk to the patient his afternoon. He was busy this afternoon but states he can talk to the [facility] tomorrow. Will call the patient back in the am.   Plan:Telephone follow up appointment with care management team member scheduled for:  07-26-2022 at 53 am  [place], MSN, CCM RN Care Manager  Chronic Care Management Direct Number: [phone]

## 2022-07-26 ENCOUNTER — Other Ambulatory Visit: Payer: Medicare HMO

## 2022-07-26 ENCOUNTER — Telehealth: Payer: Medicare HMO

## 2022-07-26 NOTE — Progress Notes (Signed)
   07/26/2022  Patient ID: Brandon Hart, male   DOB: 27-Jul-1945, 77 y.o.   MRN: [id number]  Subjective/Objective: -Referral to pharmacy from PCP, Denyse Amass, for assistance with affordability of LABA/LAMA for patient. -Patient has Medicare drug coverage through [facility] but maintenance inhaler for his asthma have either not been covered and/or copay not affordable -Previously prescribed Anoro and tolerated well  Assessment/Plan: -Based on conversation evaluating household size and income, patient would qualify for Anoro patient assistance through [place] chart to Rx Medication Assistance team to initiate application process  Follow-up:  Contacting patient to notify application will be sent and provide my direct number, so he can contact me for medication education in regard to administration if needed.  Darlina Guys, PharmD, DPLA

## 2022-07-27 ENCOUNTER — Other Ambulatory Visit (HOSPITAL_COMMUNITY): Payer: Self-pay

## 2022-07-27 MED ORDER — UMECLIDINIUM-VILANTEROL 62.5-25 MCG/ACT IN AEPB: 1.0000 | INHALATION_SPRAY | Freq: Every day | RESPIRATORY_TRACT | 3 refills | Status: DC

## 2022-07-27 NOTE — Progress Notes (Signed)
   07/27/2022  Patient ID: Brandon Hart, male   DOB: 1945-12-06, 77 y.o.   MRN: [id number]  Pres[facility] to [facility]'s preferred [facility], and I contacted them to verify insurance coverage.  They were able to confirm insurance was paying but could not see patient copay due to having to order the medication.  Med will be in tomorrow, so I contacted Mr. Breeden to provide an update.  He gets a notification from [facility] when medication is ready.  Once he gets notification, he will call to verify his copay and reach out to me if not affordable.  Darlina Guys, PharmD, DPLA

## 2022-07-27 NOTE — Addendum Note (Signed)
Addended by: Paulina Fusi A on: 07/27/2022 01:28 PM   Modules accepted: Orders

## 2022-07-27 NOTE — Progress Notes (Signed)
   07/27/2022  Patient ID: Brandon Hart, male   DOB: November 13, 1945, 77 y.o.   MRN: [id number]  Message received from medication assistance team that they were able to run a test claim on Brandon Hart's insurance for Anoro inhaler, and it went through at [employer] copay.  Pending order to be signed by PCP for Anoro and will contact pharmacy once sent to verify claim goes through before notifying patient.    Darlina Guys, PharmD, DPLA

## 2022-07-27 NOTE — Addendum Note (Signed)
Addended by: Marnee Guarneri T on: 07/27/2022 01:48 PM   Modules accepted: Orders

## 2022-07-27 NOTE — Progress Notes (Signed)
Ran test claim for Anoro inhaler, received paid test claim with 0 dollar copay. Pt states all of his medication is usually free of charge, if assistance was needed, [employer] requires that applicants have spent at least 600 dollars on prescriptions for 2024.

## 2022-07-29 ENCOUNTER — Ambulatory Visit: Payer: Self-pay | Admitting: *Deleted

## 2022-07-29 NOTE — Telephone Encounter (Signed)
Patient made aware of Provider's recommendations and verbalized understanding.   

## 2022-07-29 NOTE — Telephone Encounter (Signed)
Summary: medication reaction   Pt was prescribed buPROPion (WELLBUTRIN SR) 150 MG 12 hr tablet to help stop smoking but he stated it makes his throat real dry and causes trouble sleeping / pt stated he stopped taking them after a week / please advise         Chief Complaint:  Wellbutrin 150 mg medication SE- stopped medication 2 days ago Symptoms: dry mouth, trouble sleeping  Disposition: [] ED /[] Urgent Care (no appt availability in office) / [] Appointment(In office/virtual)/ []  [facility]/ [] Home Care/ [] Refused Recommended Disposition /[] [facility]/ [x]  Follow-up with PCP Additional Notes: Patient states he has stopped medication- he would be willing to try something else- "although everything has side effects"   Reason for Disposition . [1] Caller has NON-URGENT medicine question about med that PCP prescribed AND [2] triager unable to answer question  Answer Assessment - Initial Assessment Questions 1. NAME of MEDICINE: "What medicine(s) are you calling about?"     Bupropion 150mg  2. QUESTION: "What is your question?" (e.g., double dose of medicine, side effect)     SE- mouth dryness, trouble sleeping 3. PRESCRIBER: "Who prescribed the medicine?" Reason: if prescribed by specialist, call should be referred to that group.     PCP 4. SYMPTOMS: "Do you have any symptoms?" If Yes, ask: "What symptoms are you having?"  "How bad are the symptoms (e.g., mild, moderate, severe)     Patient stopped medication due to SE-mouth dryness, trouble sleeping- -stopped 2 days ago Patient states he would be willing to try something else- has never tried patches or gum to help stop.  Protocols used: Medication Question Call-A-AH

## 2022-08-03 ENCOUNTER — Ambulatory Visit (INDEPENDENT_AMBULATORY_CARE_PROVIDER_SITE_OTHER): Payer: Medicare HMO

## 2022-08-03 ENCOUNTER — Telehealth: Payer: Medicare HMO

## 2022-08-03 DIAGNOSIS — J432 Centrilobular emphysema: Secondary | ICD-10-CM

## 2022-08-03 DIAGNOSIS — I10 Essential (primary) hypertension: Secondary | ICD-10-CM

## 2022-08-03 NOTE — Chronic Care Management (AMB) (Signed)
Chronic Care Management   CCM RN Visit Note  08/03/2022 Name: Brandon Hart MRN: [id number] DOB: 1946-03-02  Subjective: Brandon Hart is a 77 y.o. year old male who is a primary care patient of [name], [name], NP. The patient was referred to the Chronic Care Management team for assistance with care management needs subsequent to provider initiation of CCM services and plan of care.    Today's Visit:  Engaged with patient by telephone for initial visit.        Goals Addressed             This Visit's Progress    CCM Expected Outcome:  Monitor, Self-Manage, and Reduce Symptoms of Hypertension       Current Barriers:  Knowledge Deficits related to abnormal labs and how to manage effectively with HTN and other chronic conditions associated with HLD and heart health Chronic Disease Management support and education needs related to effective management of HTN BP Readings from Last 3 Encounters:  07/21/22 113/70  06/13/22 126/79  01/19/22 107/69     Planned Interventions: Evaluation of current treatment plan related to hypertension self management and patient's adherence to plan as established by provider;   Provided education to patient re: stroke prevention, s/s of heart attack and stroke. Education on how controlling blood pressures and cholesterol levels reduce the risk of heart attack and stroke. ; Reviewed prescribed diet heart healthy diet, the patient states he does not add salt to his food but he really eats what he wants to eat Reviewed medications with patient and discussed importance of compliance. The patient is compliant with medications. Read the notes from the pcp about lab work abnormal readings and ask the patient if he has gotten the OTC Omega 3 or Fish Oil. He states he has not but will get some. Review of repeating lab work on 08-19-2022 for abnormal labs he had earlier in the month. Explained these lab values;  Discussed plans with patient for  ongoing care management follow up and provided patient with direct contact information for care management team; Advised patient, providing education and rationale, to monitor blood pressure daily and record, calling PCP for findings outside established parameters;  Reviewed scheduled/upcoming provider appointments including: 09-01-2022 and 08-19-2022 for labwork Advised patient to discuss changes in his blood pressures, changes in his lab values for cholesterol, questions or concerns with provider; Provided education on prescribed diet heart healthy diet, monitoring for starchy foods that can increase triglyceride levels, staying away from fried foods. Will send the patient information in mail for education and help with choosing healthy meal options;  Discussed complications of poorly controlled blood pressure such as heart disease, stroke, circulatory complications, vision complications, kidney impairment, sexual dysfunction;  Screening for signs and symptoms of depression related to chronic disease state;  Assessed social determinant of health barriers;  Smoker >50 years. Was taking medications but this was causing insomnia. The patient states he has tried to cut back. Is working with the pcp. Will send information in mail for help with smoking cessation  Symptom Management: Take medications as prescribed   Attend all scheduled provider appointments Call provider office for new concerns or questions  call the Suicide and Crisis Lifeline: 988 call the [place] National Suicide Prevention Lifeline: 805-760-2095 or TTY: 629-863-8850 TTY 250-298-9370) to talk to a trained counselor call 1-800-273-TALK (toll free, 24 hour hotline) if experiencing a Mental Health or [place]  learn about high blood pressure develop an action plan  for high blood pressure keep all doctor appointments take medications for blood pressure exactly as prescribed report new symptoms to your doctor  Follow Up  Plan: Telephone follow up appointment with care management team member scheduled for: 10-05-2022 at 0945 am       CCM:  Maintain, Monitor and Self-Manage Symptoms of COPD       Current Barriers:  Care Coordination needs related to inhaler cost and needs from pharm D assistance with obtaining inhaler in a patient with COPD Chronic Disease Management support and education needs related to effective management of COPD Current every day smoker. Working with pcp on smoking cessation  Planned Interventions: Provided patient with basic written and verbal COPD education on self care/management/and exacerbation prevention Advised patient to track and manage COPD triggers Provided written and verbal instructions on pursed lip breathing and utilized returned demonstration as teach back Provided instruction about proper use of medications used for management of COPD including inhalers. The patient has received his Anoro inhaler and has worked with the pharm D on cost constraints. The patient was able to get his inhaler at $0 co-pay. The patient was thankful for this. Denies any questions related to inhaler use.  Advised patient to self assesses COPD action plan zone and make appointment with provider if in the yellow zone for 48 hours without improvement Advised patient to engage in light exercise as tolerated 3-5 days a week to aid in the the management of COPD Provided education about and advised patient to utilize infection prevention strategies to reduce risk of respiratory infection Discussed the importance of adequate rest and management of fatigue with COPD Screening for signs and symptoms of depression related to chronic disease state  Assessed social determinant of health barriers Will send the patient smoking cessation material in the mail to assist with education and help with smoking cessation. The patient is actively working with the pcp on smoking cessation  Symptom Management: Take  medications as prescribed   Attend all scheduled provider appointments Call provider office for new concerns or questions  call the Suicide and Crisis Lifeline: 988 call the [place] National Suicide Prevention Lifeline: 303-545-2344 or TTY: (754)665-5812 TTY 469-223-7042) to talk to a trained counselor call 1-800-273-TALK (toll free, 24 hour hotline) if experiencing a Mental Health or [place]  avoid second hand smoke eliminate smoking in my home identify and remove indoor air pollutants limit outdoor activity during cold weather listen for public air quality announcements every day do breathing exercises every day develop a rescue plan eliminate symptom triggers at home follow rescue plan if symptoms flare-up  Follow Up Plan: Telephone follow up appointment with care management team member scheduled for: 10-05-2022 at 0945 am          Plan:Telephone follow up appointment with care management team member scheduled for:  10-05-2022 at Luis M. Cintron am  Noreene Larsson RN, MSN, CCM RN Care Manager  Chronic Care Management Direct Number: [phone]

## 2022-08-03 NOTE — Plan of Care (Signed)
Chronic Care Management Provider Comprehensive Care Plan    08/03/2022 Name: Brandon Hart MRN: [id number] DOB: 08-Jun-1945  Referral to Chronic Care Management (CCM) services was placed by Provider:  Marnee Guarneri, NP on Date: 07-21-2022.  Chronic Condition 1: COPD Provider Assessment and Plan  Chronic, ongoing.  Continue to recommend complete smoking cessation.  Unable to obtain inhaler maintenance as too costly will work with CCM team on this, new referral placed.  Would benefit LABA/LAMA with FEV1 59% and FEV1/FVC 97%.  Educated him at length on emphysema and progressive nature of disease.   Continue annual lung screening.  Recommend complete cessation of smoking.  Spirometry up to date, due next March 2025.         Relevant Orders    CBC with Differential/Platelet    Spirometry with Graph (Completed)    AMB Referral to Chronic Care Management Services     Expected Outcome/Goals Addressed This Visit (Provider CCM goals/Provider Assessment and plan    CCM (COPD) EXPECTED OUTCOME: MONITOR, SELF-MANAGE AND REDUCE SYMPTOMS OF COPD  Symptom Management Condition 1: Take all medications as prescribed Attend all scheduled provider appointments Call provider office for new concerns or questions  call the Suicide and Crisis Lifeline: 988 call the [place] National Suicide Prevention Lifeline: 3015909885 or TTY: 916-443-8847 TTY 330-538-0194) to talk to a trained counselor call 1-800-273-TALK (toll free, 24 hour hotline) if experiencing a Mental Health or [place]  eliminate smoking in my home identify and avoid work-related triggers identify and remove indoor air pollutants limit outdoor activity during cold weather listen for public air quality announcements every day do breathing exercises every day develop a rescue plan eliminate symptom triggers at home follow rescue plan if symptoms flare-up  Chronic Condition 2: HTN Provider Assessment and Plan    Chronic, stable.  BP at goal today.  Recommend he start checking BP at home three mornings a week and documenting for provider.  Discussed goal BP with him.  Continue current medication regimen and adjust as needed.  LABS: CMP, CBC, TSH, urine ALB. Recommend complete cessation of smoking.  Refills sent.  Return in 6 months.         Relevant Medications    amLODipine (NORVASC) 5 MG tablet    atorvastatin (LIPITOR) 20 MG tablet    lisinopril-hydrochlorothiazide (ZESTORETIC) 20-25 MG tablet    Other Relevant Orders    CBC with Differential/Platelet    Comprehensive metabolic panel    TSH    Microalbumin, Urine Waived     Expected Outcome/Goals Addressed This Visit (Provider CCM goals/Provider Assessment and plan   CCM (HYPERTENSION)  EXPECTED OUTCOME:  MONITOR,SELF- MANAGE AND REDUCE SYMPTOMS OF HYPERTENSION   Symptom Management Condition 2: Take all medications as prescribed Attend all scheduled provider appointments Call provider office for new concerns or questions  call the Suicide and Crisis Lifeline: 988 call the [place] National Suicide Prevention Lifeline: 980-766-2772 or TTY: 310-592-6362 [place] 3195881372) to talk to a trained counselor call 1-800-273-TALK (toll free, 24 hour hotline) if experiencing a Mental Health or [place]  eliminate smoking in my home identify and avoid work-related triggers identify and remove indoor air pollutants limit outdoor activity during cold weather listen for public air quality announcements every day do breathing exercises every day develop a rescue plan eliminate symptom triggers at home follow rescue plan if symptoms flare-up  Problem List Patient Active Problem List   Diagnosis Date Noted   Elevated PSA measurement 07/17/2022   Coronary  artery disease of native artery of native heart with stable angina pectoris ([place]) 01/19/2022   Vitamin D deficiency 07/01/2021   Epidermal cyst of face 12/28/2020   Aortic  atherosclerosis ([place]) 07/11/2019   Pulmonary nodule 07/11/2019   Hyperlipidemia 07/14/2016   Advanced care planning/counseling discussion 07/13/2016   Insomnia 09/01/2015   Centrilobular emphysema ([place]) 05/27/2015   Nicotine dependence, cigarettes, uncomplicated 0000000   Hypertension     Medication Management  Current Outpatient Medications:    albuterol (PROAIR HFA) 108 (90 Base) MCG/ACT inhaler, Inhale 2 puffs into the lungs every 6 (six) hours as needed for wheezing or shortness of breath., Disp: 18 g, Rfl: 4   amLODipine (NORVASC) 5 MG tablet, Take 1 tablet (5 mg total) by mouth daily., Disp: 90 tablet, Rfl: 4   atorvastatin (LIPITOR) 20 MG tablet, Take 1 tablet (20 mg total) by mouth daily., Disp: 90 tablet, Rfl: 4   buPROPion (WELLBUTRIN SR) 150 MG 12 hr tablet, Take 150 MG (one tablet) once daily by mouth for 3 days; then increase to 150 MG (one tablet) twice daily by mouth., Disp: 120 tablet, Rfl: 1   lisinopril-hydrochlorothiazide (ZESTORETIC) 20-25 MG tablet, Take 1 tablet by mouth daily., Disp: 90 tablet, Rfl: 4   umeclidinium-vilanterol (ANORO ELLIPTA) 62.5-25 MCG/ACT AEPB, Inhale 1 puff into the lungs daily at 6 (six) AM., Disp: 90 each, Rfl: 3  Cognitive Assessment Identity Confirmed: : Name; DOB Cognitive Status: Normal   Functional Assessment Hearing Difficulty or Deaf: no Wear Glasses or Blind: no Concentrating, Remembering or Making Decisions Difficulty (CP): no Difficulty Communicating: no Difficulty Eating/Swallowing: no Walking or Climbing Stairs Difficulty: no Dressing/Bathing Difficulty: no Doing Errands Independently Difficulty (such as shopping) (CP): no   Caregiver Assessment  Primary Source of Support/Comfort: child(ren) Name of Support/Comfort Primary Source: daughter-Lisa Q[place]man in Home: alone Family Caregiver if Needed: none Primary Roles/Responsibilities: retired   Planned Interventions  Provided patient with basic written and  verbal COPD education on self care/management/and exacerbation prevention Advised patient to track and manage COPD triggers Provided written and verbal instructions on pursed lip breathing and utilized returned demonstration as teach back Provided instruction about proper use of medications used for management of COPD including inhalers. The patient has received his Anoro inhaler and has worked with the pharm D on cost constraints. The patient was able to get his inhaler at $0 co-pay. The patient was thankful for this. Denies any questions related to inhaler use.  Advised patient to self assesses COPD action plan zone and make appointment with provider if in the yellow zone for 48 hours without improvement Advised patient to engage in light exercise as tolerated 3-5 days a week to aid in the the management of COPD Provided education about and advised patient to utilize infection prevention strategies to reduce risk of respiratory infection Discussed the importance of adequate rest and management of fatigue with COPD Screening for signs and symptoms of depression related to chronic disease state  Assessed social determinant of health barriers Will send the patient smoking cessation material in the mail to assist with education and help with smoking cessation. The patient is actively working with the pcp on smoking cessation Evaluation of current treatment plan related to hypertension self management and patient's adherence to plan as established by provider;   Provided education to patient re: stroke prevention, s/s of heart attack and stroke. Education on how controlling blood pressures and cholesterol levels reduce the risk of heart attack and stroke. ; Reviewed prescribed diet  heart healthy diet, the patient states he does not add salt to his food but he really eats what he wants to eat Reviewed medications with patient and discussed importance of compliance. The patient is compliant with  medications. Read the notes from the pcp about lab work abnormal readings and ask the patient if he has gotten the OTC Omega 3 or Fish Oil. He states he has not but will get some. Review of repeating lab work on 08-19-2022 for abnormal labs he had earlier in the month. Explained these lab values;  Discussed plans with patient for ongoing care management follow up and provided patient with direct contact information for care management team; Advised patient, providing education and rationale, to monitor blood pressure daily and record, calling PCP for findings outside established parameters;  Reviewed scheduled/upcoming provider appointments including: 09-01-2022 and 08-19-2022 for labwork Advised patient to discuss changes in his blood pressures, changes in his lab values for cholesterol, questions or concerns with provider; Provided education on prescribed diet heart healthy diet, monitoring for starchy foods that can increase triglyceride levels, staying away from fried foods. Will send the patient information in mail for education and help with choosing healthy meal options;  Discussed complications of poorly controlled blood pressure such as heart disease, stroke, circulatory complications, vision complications, kidney impairment, sexual dysfunction;  Screening for signs and symptoms of depression related to chronic disease state;  Assessed social determinant of health barriers;  Smoker >50 years. Was taking medications but this was causing insomnia. The patient states he has tried to cut back. Is working with the pcp. Will send information in mail for help with smoking cessation    Interaction and coordination with outside resources, practitioners, and providers See CCM Referral  Care Plan: Printed and mailed to patient

## 2022-08-03 NOTE — Patient Instructions (Addendum)
Please call the care guide team at [phone] if you need to cancel or reschedule your appointment.   If you are experiencing a Mental Health or [place] or need someone to talk to, please call the Suicide and Crisis Lifeline: 988 call the [place] National Suicide Prevention Lifeline: 317-264-6727 or TTY: 770 364 4908 TTY (346)722-1872) to talk to a trained counselor call 1-800-273-TALK (toll free, 24 hour hotline)   Following is a copy of the CCM Program Consent:  CCM service includes personalized support from designated clinical staff supervised by the physician, including individualized plan of care and coordination with other care providers 24/7 contact phone numbers for assistance for urgent and routine care needs. Service will only be billed when office clinical staff spend 20 minutes or more in a month to coordinate care. Only one practitioner may furnish and bill the service in a calendar month. The patient may stop CCM services at amy time (effective at the end of the month) by phone call to the office staff. The patient will be responsible for cost sharing (co-pay) or up to 20% of the service fee (after annual deductible is met)  Following is a copy of your full provider care plan:   Goals Addressed             This Visit's Progress    CCM Expected Outcome:  Monitor, Self-Manage, and Reduce Symptoms of Hypertension       Current Barriers:  Knowledge Deficits related to abnormal labs and how to manage effectively with HTN and other chronic conditions associated with HLD and heart health Chronic Disease Management support and education needs related to effective management of HTN BP Readings from Last 3 Encounters:  07/21/22 113/70  06/13/22 126/79  01/19/22 107/69     Planned Interventions: Evaluation of current treatment plan related to hypertension self management and patient's adherence to plan as established by provider;   Provided education to patient  re: stroke prevention, s/s of heart attack and stroke. Education on how controlling blood pressures and cholesterol levels reduce the risk of heart attack and stroke. ; Reviewed prescribed diet heart healthy diet, the patient states he does not add salt to his food but he really eats what he wants to eat Reviewed medications with patient and discussed importance of compliance. The patient is compliant with medications. Read the notes from the pcp about lab work abnormal readings and ask the patient if he has gotten the OTC Omega 3 or Fish Oil. He states he has not but will get some. Review of repeating lab work on 08-19-2022 for abnormal labs he had earlier in the month. Explained these lab values;  Discussed plans with patient for ongoing care management follow up and provided patient with direct contact information for care management team; Advised patient, providing education and rationale, to monitor blood pressure daily and record, calling PCP for findings outside established parameters;  Reviewed scheduled/upcoming provider appointments including: 09-01-2022 and 08-19-2022 for labwork Advised patient to discuss changes in his blood pressures, changes in his lab values for cholesterol, questions or concerns with provider; Provided education on prescribed diet heart healthy diet, monitoring for starchy foods that can increase triglyceride levels, staying away from fried foods. Will send the patient information in mail for education and help with choosing healthy meal options;  Discussed complications of poorly controlled blood pressure such as heart disease, stroke, circulatory complications, vision complications, kidney impairment, sexual dysfunction;  Screening for signs and symptoms of depression related to chronic disease  state;  Assessed social determinant of health barriers;  Smoker >50 years. Was taking medications but this was causing insomnia. The patient states he has tried to cut back. Is  working with the pcp. Will send information in mail for help with smoking cessation  Symptom Management: Take medications as prescribed   Attend all scheduled provider appointments Call provider office for new concerns or questions  call the Suicide and Crisis Lifeline: 988 call the [place] National Suicide Prevention Lifeline: 571-039-1220 or TTY: 936-260-8822 TTY (331)885-8866) to talk to a trained counselor call 1-800-273-TALK (toll free, 24 hour hotline) if experiencing a Mental Health or [place]  learn about high blood pressure develop an action plan for high blood pressure keep all doctor appointments take medications for blood pressure exactly as prescribed report new symptoms to your doctor  Follow Up Plan: Telephone follow up appointment with care management team member scheduled for: 10-05-2022 at 0945 am       CCM:  Maintain, Monitor and Self-Manage Symptoms of COPD       Current Barriers:  Care Coordination needs related to inhaler cost and needs from pharm D assistance with obtaining inhaler in a patient with COPD Chronic Disease Management support and education needs related to effective management of COPD Current every day smoker. Working with pcp on smoking cessation  Planned Interventions: Provided patient with basic written and verbal COPD education on self care/management/and exacerbation prevention Advised patient to track and manage COPD triggers Provided written and verbal instructions on pursed lip breathing and utilized returned demonstration as teach back Provided instruction about proper use of medications used for management of COPD including inhalers. The patient has received his Anoro inhaler and has worked with the pharm D on cost constraints. The patient was able to get his inhaler at $0 co-pay. The patient was thankful for this. Denies any questions related to inhaler use.  Advised patient to self assesses COPD action plan zone and make  appointment with provider if in the yellow zone for 48 hours without improvement Advised patient to engage in light exercise as tolerated 3-5 days a week to aid in the the management of COPD Provided education about and advised patient to utilize infection prevention strategies to reduce risk of respiratory infection Discussed the importance of adequate rest and management of fatigue with COPD Screening for signs and symptoms of depression related to chronic disease state  Assessed social determinant of health barriers Will send the patient smoking cessation material in the mail to assist with education and help with smoking cessation. The patient is actively working with the pcp on smoking cessation  Symptom Management: Take medications as prescribed   Attend all scheduled provider appointments Call provider office for new concerns or questions  call the Suicide and Crisis Lifeline: 988 call the [place] National Suicide Prevention Lifeline: (403) 253-4406 or TTY: (567)237-0073 TTY (914)359-3221) to talk to a trained counselor call 1-800-273-TALK (toll free, 24 hour hotline) if experiencing a Mental Health or [place]  avoid second hand smoke eliminate smoking in my home identify and remove indoor air pollutants limit outdoor activity during cold weather listen for public air quality announcements every day do breathing exercises every day develop a rescue plan eliminate symptom triggers at home follow rescue plan if symptoms flare-up  Follow Up Plan: Telephone follow up appointment with care management team member scheduled for: 10-05-2022 at 0945 am          The patient verbalized understanding of instructions, educational  materials, and care plan provided today and agreed to receive a mailed copy of patient instructions, educational materials, and care plan.   Telephone follow up appointment with care management team member scheduled for: 10-05-2022 at 0945 am  High  Triglycerides Eating Plan Triglycerides are a type of fat in the blood. High levels of triglycerides can increase your risk of heart disease and stroke. If your triglyceride levels are high, choosing the right foods can help lower your triglycerides and keep your heart healthy. Work with your health care provider or a dietitian to develop an eating plan that is right for you. What are tips for following this plan? General guidelines  Lose weight, if you are overweight. For most people, losing 5-10 lb (2-5 kg) helps lower triglyceride levels. A weight-loss plan may include: 30 minutes of exercise at least 5 days a week. Reducing the amount of calories, sugar, and fat you eat. Eat a wide variety of fresh fruits, vegetables, and whole grains. These foods are high in fiber. Eat foods that contain healthy fats, such as fatty fish, nuts, seeds, and olive oil. Avoid foods that are high in added sugar, added salt (sodium), and saturated fat. Avoid low-fiber, refined carbohydrates such as white bread, crackers, noodles, and white rice. Avoid foods with trans fats or partially hydrogenated oils, such as fried foods or stick margarine. If you drink alcohol: Limit how much you have to: 0-1 drink a day for women who are not pregnant. 0-2 drinks a day for men. Your health care provider may recommend that you drink less than these amounts depending on your overall health. Know how much alcohol is in a drink. In the [place] one drink equals one 12 oz bottle of beer (355 mL), one 5 oz glass of wine (148 mL), or one 1 oz glass of hard liquor (44 mL). Reading food labels Check food labels for: The amount of saturated fat. Choose foods with no or very little saturated fat (less than 2 g). The amount of trans fat. Choose foods with no transfat. The amount of cholesterol. Choose foods that are low in cholesterol. The amount of sodium. Choose foods with less than 140 milligrams (mg) per serving. Shopping Buy dairy  products labeled as nonfat (skim) or low-fat (1%). Avoid buying processed or prepackaged foods. These are often high in added sugar, sodium, and fat. Cooking Choose healthy fats when cooking, such as olive oil, avocado oil, or canola oil. Cook foods using lower fat methods, such as baking, broiling, boiling, or grilling. Make your own sauces, dressings, and marinades when possible, instead of buying them. Store-bought sauces, dressings, and marinades are often high in sodium and sugar. Meal planning Eat more home-cooked food and less restaurant, buffet, and fast food. Eat fatty fish at least 2 times each week. Examples of fatty fish include salmon, trout, sardines, mackerel, tuna, and herring. If you eat whole eggs, do not eat more than 4 egg yolks per week. What foods should I eat? Fruits All fresh, canned (in natural juice), or frozen fruits. Vegetables Fresh or frozen vegetables. Low-sodium canned vegetables. Grains Whole wheat or whole grain breads, crackers, cereals, and pasta. Unsweetened oatmeal. Bulgur. Barley. Quinoa. Brown rice. Whole wheat flour tortillas. Meats and other proteins Skinless chicken or [place]. Ground chicken or [place]. Lean cuts of pork, trimmed of fat. Fish and seafood, especially salmon, trout, and herring. Egg whites. Dried beans, peas, or lentils. Unsalted nuts or seeds. Unsalted canned beans. Natural peanut or almond butter or other  nut butters. Dairy Low-fat dairy products. Skim or low-fat (1%) milk. Reduced fat (2%) and low-sodium cheese. Low-fat ricotta cheese. Low-fat cottage cheese. Plain, low-fat yogurt. Fats and oils Tub margarine without trans fats. Light or reduced-fat mayonnaise. Light or reduced-fat salad dressings. Avocado. Safflower, olive, sunflower, soybean, and canola oils. The items listed above may not be a complete list of recommended foods and beverages. Talk with your dietitian about what dietary choices are best for you. What foods should I  avoid? Fruits Sweetened dried fruit. Canned fruit in syrup. Fruit juice. Vegetables Creamed or fried vegetables. Vegetables in a cheese sauce. Grains White bread. White (regular) pasta. White rice. Cornbread. Bagels. Pastries. Crackers that contain trans fat. Meats and other proteins Fatty cuts of meat. Ribs. Chicken wings. Berniece Salines. Sausage. Bologna. Salami. Chitterlings. Fatback. Hot dogs. Bratwurst. Packaged lunch meats. Dairy Whole or reduced-fat (2%) milk. Half-and-half. Cream cheese. Full-fat or sweetened yogurt. Full-fat cheese. Nondairy creamers. Whipped toppings. Processed cheese or cheese spreads. Cheese curds. Fats and oils Butter. Stick margarine. Lard. Shortening. Ghee. Bacon fat. Tropical oils, such as coconut, palm kernel, or palm oils. Beverages Alcohol. Sweetened drinks, such as soda, lemonade, fruit drinks, or punches. Sweets and desserts Corn syrup. Sugars. Honey. Molasses. Candy. Jam and jelly. Syrup. Sweetened cereals. Cookies. Pies. Cakes. Donuts. Muffins. Ice cream. Condiments Store-bought sauces, dressings, and marinades that are high in sugar, such as ketchup and barbecue sauce. The items listed above may not be a complete list of foods and beverages you should avoid. Talk with your dietitian about what dietary choices are best for you. Summary High levels of triglycerides can increase the risk of heart disease and stroke. Choosing the right foods can help lower your triglycerides. Eat plenty of fresh fruits, vegetables, and whole grains. Choose low-fat dairy and lean meats. Eat fatty fish at least twice a week. Avoid processed and prepackaged foods with added sugar, sodium, saturated fat, and trans fat. If you need suggestions or have questions about what types of food are good for you, talk with your health care provider or a dietitian. This information is not intended to replace advice given to you by your health care provider. Make sure you discuss any questions you  have with your health care provider. Document Revised: 09/11/2020 Document Reviewed: 09/11/2020 Elsevier Patient Education  [place]. Cholesterol Content in Foods Cholesterol is a waxy, fat-like substance that helps to carry fat in the blood. The body needs cholesterol in small amounts, but too much cholesterol can cause damage to the arteries and heart. What foods have cholesterol?  Cholesterol is found in animal-based foods, such as meat, seafood, and dairy. Generally, low-fat dairy and lean meats have less cholesterol than full-fat dairy and fatty meats. The milligrams of cholesterol per serving (mg per serving) of common cholesterol-containing foods are listed below. Meats and other proteins Egg -- one large whole egg has 186 mg. Veal shank -- 4 oz (113 g) has 141 mg. Lean ground [place] (93% lean) -- 4 oz (113 g) has 118 mg. Fat-trimmed lamb loin -- 4 oz (113 g) has 106 mg. Lean ground beef (90% lean) -- 4 oz (113 g) has 100 mg. Lobster -- 3.5 oz (99 g) has 90 mg. Pork loin chops -- 4 oz (113 g) has 86 mg. Canned salmon -- 3.5 oz (99 g) has 83 mg. Fat-trimmed beef top loin -- 4 oz (113 g) has 78 mg. Frankfurter -- 1 frank (3.5 oz or 99 g) has 77 mg. Crab -- 3.5  oz (99 g) has 71 mg. Roasted chicken without skin, white meat -- 4 oz (113 g) has 66 mg. Light bologna -- 2 oz (57 g) has 45 mg. Deli-cut [place] -- 2 oz (57 g) has 31 mg. Canned tuna -- 3.5 oz (99 g) has 31 mg. Berniece Salines -- 1 oz (28 g) has 29 mg. Oysters and mussels (raw) -- 3.5 oz (99 g) has 25 mg. Mackerel -- 1 oz (28 g) has 22 mg. Trout -- 1 oz (28 g) has 20 mg. Pork sausage -- 1 link (1 oz or 28 g) has 17 mg. Salmon -- 1 oz (28 g) has 16 mg. Tilapia -- 1 oz (28 g) has 14 mg. Dairy Soft-serve ice cream --  cup (4 oz or 86 g) has 103 mg. Whole-milk yogurt -- 1 cup (8 oz or 245 g) has 29 mg. Cheddar cheese -- 1 oz (28 g) has 28 mg. American cheese -- 1 oz (28 g) has 28 mg. Whole milk -- 1 cup (8 oz or 250 mL)  has 23 mg. 2% milk -- 1 cup (8 oz or 250 mL) has 18 mg. Cream cheese -- 1 tablespoon (Tbsp) (14.5 g) has 15 mg. Cottage cheese --  cup (4 oz or 113 g) has 14 mg. Low-fat (1%) milk -- 1 cup (8 oz or 250 mL) has 10 mg. Sour cream -- 1 Tbsp (12 g) has 8.5 mg. Low-fat yogurt -- 1 cup (8 oz or 245 g) has 8 mg. Nonfat Greek yogurt -- 1 cup (8 oz or 228 g) has 7 mg. Half-and-half cream -- 1 Tbsp (15 mL) has 5 mg. Fats and oils Cod liver oil -- 1 tablespoon (Tbsp) (13.6 g) has 82 mg. Butter -- 1 Tbsp (14 g) has 15 mg. Lard -- 1 Tbsp (12.8 g) has 14 mg. Bacon grease -- 1 Tbsp (12.9 g) has 14 mg. Mayonnaise -- 1 Tbsp (13.8 g) has 5-10 mg. Margarine -- 1 Tbsp (14 g) has 3-10 mg. The items listed above may not be a complete list of foods with cholesterol. Exact amounts of cholesterol in these foods may vary depending on specific ingredients and brands. Contact a dietitian for more information. What foods do not have cholesterol? Most plant-based foods do not have cholesterol unless you combine them with a food that has cholesterol. Foods without cholesterol include: Grains and cereals. Vegetables. Fruits. Vegetable oils, such as olive, canola, and sunflower oil. Legumes, such as peas, beans, and lentils. Nuts and seeds. Egg whites. The items listed above may not be a complete list of foods that do not have cholesterol. Contact a dietitian for more information. Summary The body needs cholesterol in small amounts, but too much cholesterol can cause damage to the arteries and heart. Cholesterol is found in animal-based foods, such as meat, seafood, and dairy. Generally, low-fat dairy and lean meats have less cholesterol than full-fat dairy and fatty meats. This information is not intended to replace advice given to you by your health care provider. Make sure you discuss any questions you have with your health care provider. Document Revised: 09/11/2020 Document Reviewed: 09/11/2020 Elsevier Patient  Education  [place] Eating a healthy diet is important for the health of your heart. A heart-healthy eating plan includes: Eating less unhealthy fats. Eating more healthy fats. Eating less salt in your food. Salt is also called sodium. Making other changes in your diet. Talk with your doctor or a diet specialist (dietitian) to create an eating  plan that is right for you. What is my plan? Your doctor may recommend an eating plan that includes: Total fat: ______% or less of total calories a day. Saturated fat: ______% or less of total calories a day. Cholesterol: less than _________mg a day. Sodium: less than _________mg a day. What are tips for following this plan? Cooking Avoid frying your food. Try to bake, boil, grill, or broil it instead. You can also reduce fat by: Removing the skin from poultry. Removing all visible fats from meats. Steaming vegetables in water or broth. Meal planning  At meals, divide your plate into four equal parts: Fill one-half of your plate with vegetables and green salads. Fill one-fourth of your plate with whole grains. Fill one-fourth of your plate with lean protein foods. Eat 2-4 cups of vegetables per day. One cup of vegetables is: 1 cup (91 g) broccoli or cauliflower florets. 2 medium carrots. 1 large bell pepper. 1 large sweet potato. 1 large tomato. 1 medium white potato. 2 cups (150 g) raw leafy greens. Eat 1-2 cups of fruit per day. One cup of fruit is: 1 small apple 1 large banana 1 cup (237 g) mixed fruit, 1 large orange,  cup (82 g) dried fruit, 1 cup (240 mL) 100% fruit juice. Eat more foods that have soluble fiber. These are apples, broccoli, carrots, beans, peas, and barley. Try to get 20-30 g of fiber per day. Eat 4-5 servings of nuts, legumes, and seeds per week: 1 serving of dried beans or legumes equals  cup (90 g) cooked. 1 serving of nuts is  oz (12 almonds, 24 pistachios, or 7  walnut halves). 1 serving of seeds equals  oz (8 g). General information Eat more home-cooked food. Eat less restaurant, buffet, and fast food. Limit or avoid alcohol. Limit foods that are high in starch and sugar. Avoid fried foods. Lose weight if you are overweight. Keep track of how much salt (sodium) you eat. This is important if you have high blood pressure. Ask your doctor to tell you more about this. Try to add vegetarian meals each week. Fats Choose healthy fats. These include olive oil and canola oil, flaxseeds, walnuts, almonds, and seeds. Eat more omega-3 fats. These include salmon, mackerel, sardines, tuna, flaxseed oil, and ground flaxseeds. Try to eat fish at least 2 times each week. Check food labels. Avoid foods with trans fats or high amounts of saturated fat. Limit saturated fats. These are often found in animal products, such as meats, butter, and cream. These are also found in plant foods, such as palm oil, palm kernel oil, and coconut oil. Avoid foods with partially hydrogenated oils in them. These have trans fats. Examples are stick margarine, some tub margarines, cookies, crackers, and other baked goods. What foods should I eat? Fruits All fresh, canned (in natural juice), or frozen fruits. Vegetables Fresh or frozen vegetables (raw, steamed, roasted, or grilled). Green salads. Grains Most grains. Choose whole wheat and whole grains most of the time. Rice and pasta, including brown rice and pastas made with whole wheat. Meats and other proteins Lean, well-trimmed beef, veal, pork, and lamb. Chicken and [place] without skin. All fish and shellfish. Wild duck, rabbit, pheasant, and venison. Egg whites or low-cholesterol egg substitutes. Dried beans, peas, lentils, and tofu. Seeds and most nuts. Dairy Low-fat or nonfat cheeses, including ricotta and mozzarella. Skim or 1% milk that is liquid, powdered, or evaporated. Buttermilk that is made with low-fat milk. Nonfat or  low-fat yogurt. Fats  and oils Non-hydrogenated (trans-free) margarines. Vegetable oils, including soybean, sesame, sunflower, olive, peanut, safflower, corn, canola, and cottonseed. Salad dressings or mayonnaise made with a vegetable oil. Beverages Mineral water. Coffee and tea. Diet carbonated beverages. Sweets and desserts Sherbet, gelatin, and fruit ice. Small amounts of dark chocolate. Limit all sweets and desserts. Seasonings and condiments All seasonings and condiments. The items listed above may not be a complete list of foods and drinks you can eat. Contact a dietitian for more options. What foods should I avoid? Fruits Canned fruit in heavy syrup. Fruit in cream or butter sauce. Fried fruit. Limit coconut. Vegetables Vegetables cooked in cheese, cream, or butter sauce. Fried vegetables. Grains Breads that are made with saturated or trans fats, oils, or whole milk. Croissants. Sweet rolls. Donuts. High-fat crackers, such as cheese crackers. Meats and other proteins Fatty meats, such as hot dogs, ribs, sausage, bacon, rib-eye roast or steak. High-fat deli meats, such as salami and bologna. Caviar. Domestic duck and goose. Organ meats, such as liver. Dairy Cream, sour cream, cream cheese, and creamed cottage cheese. Whole-milk cheeses. Whole or 2% milk that is liquid, evaporated, or condensed. Whole buttermilk. Cream sauce or high-fat cheese sauce. Yogurt that is made from whole milk. Fats and oils Meat fat, or shortening. Cocoa butter, hydrogenated oils, palm oil, coconut oil, palm kernel oil. Solid fats and shortenings, including bacon fat, salt pork, lard, and butter. Nondairy cream substitutes. Salad dressings with cheese or sour cream. Beverages Regular sodas and juice drinks with added sugar. Sweets and desserts Frosting. Pudding. Cookies. Cakes. Pies. Milk chocolate or white chocolate. Buttered syrups. Full-fat ice cream or ice cream drinks. The items listed above may not be  a complete list of foods and drinks to avoid. Contact a dietitian for more information. Summary Heart-healthy meal planning includes eating less unhealthy fats, eating more healthy fats, and making other changes in your diet. Eat a balanced diet. This includes fruits and vegetables, low-fat or nonfat dairy, lean protein, nuts and legumes, whole grains, and heart-healthy oils and fats. This information is not intended to replace advice given to you by your health care provider. Make sure you discuss any questions you have with your health care provider. Document Revised: 06/07/2021 Document Reviewed: 06/07/2021 Elsevier Patient Education  [place] of Quitting Smoking Quitting smoking is a physical and mental challenge. You may have cravings, withdrawal symptoms, and temptation to smoke. Before quitting, work with your health care provider to make a plan that can help you manage quitting. Making a plan before you quit may keep you from smoking when you have the urge to smoke while trying to quit. How to manage lifestyle changes Managing stress Stress can make you want to smoke, and wanting to smoke may cause stress. It is important to find ways to manage your stress. You could try some of the following: Practice relaxation techniques. Breathe slowly and deeply, in through your nose and out through your mouth. Listen to music. Soak in a bath or take a shower. Imagine a peaceful place or vacation. Get some support. Talk with family or friends about your stress. Join a support group. Talk with a counselor or therapist. Get some physical activity. Go for a walk, run, or bike ride. Play a favorite sport. Practice yoga.  Medicines Talk with your health care provider about medicines that might help you deal with cravings and make quitting easier for you. Relationships Social situations can be difficult when you are quitting  smoking. To manage this, you  can: Avoid parties and other social situations where people might be smoking. Avoid alcohol. Leave right away if you have the urge to smoke. Explain to your family and friends that you are quitting smoking. Ask for support and let them know you might be a bit grumpy. Plan activities where smoking is not an option. General instructions Be aware that many people gain weight after they quit smoking. However, not everyone does. To keep from gaining weight, have a plan in place before you quit, and stick to the plan after you quit. Your plan should include: Eating healthy snacks. When you have a craving, it may help to: Eat popcorn, or try carrots, celery, or other cut vegetables. Chew sugar-free gum. Changing how you eat. Eat small portion sizes at meals. Eat 4-6 small meals throughout the day instead of 1-2 large meals a day. Be mindful when you eat. You should avoid watching television or doing other things that might distract you as you eat. Exercising regularly. Make time to exercise each day. If you do not have time for a long workout, do short bouts of exercise for 5-10 minutes several times a day. Do some form of strengthening exercise, such as weight lifting. Do some exercise that gets your heart beating and causes you to breathe deeply, such as walking fast, running, swimming, or biking. This is very important. Drinking plenty of water or other low-calorie or no-calorie drinks. Drink enough fluid to keep your urine pale yellow.  How to recognize withdrawal symptoms Your body and mind may experience discomfort as you try to get used to not having nicotine in your system. These effects are called withdrawal symptoms. They may include: Feeling hungrier than normal. Having trouble concentrating. Feeling irritable or restless. Having trouble sleeping. Feeling depressed. Craving a cigarette. These symptoms may surprise you, but they are normal to have when quitting smoking. To manage  withdrawal symptoms: Avoid places, people, and activities that trigger your cravings. Remember why you want to quit. Get plenty of sleep. Avoid coffee and other drinks that contain caffeine. These may worsen some of your symptoms. How to manage cravings Come up with a plan for how to deal with your cravings. The plan should include the following: A definition of the specific situation you want to deal with. An activity or action you will take to replace smoking. A clear idea for how this action will help. The name of someone who could help you with this. Cravings usually last for 5-10 minutes. Consider taking the following actions to help you with your plan to deal with cravings: Keep your mouth busy. Chew sugar-free gum. Suck on hard candies or a straw. Brush your teeth. Keep your hands and body busy. Change to a different activity right away. Squeeze or play with a ball. Do an activity or a hobby, such as making bead jewelry, practicing needlepoint, or working with wood. Mix up your normal routine. Take a short exercise break. Go for a quick walk, or run up and down stairs. Focus on doing something kind or helpful for someone else. Call a friend or family member to talk during a craving. Join a support group. Contact a quitline. Where to find support To get help or find a support group: Call the [facility]'s Smoking Quitline: 1-800-QUIT-NOW 720-361-4217) Text QUIT to SmokefreeTXTMQ:317211 Where to find more information Visit these websites to find more information on quitting smoking: U.S. Department of Health and Human Services: www.smokefree.gov  American Lung Association: www.freedomfromsmoking.org Centers for Disease Control and Prevention (CDC): http://www.wolf.info/ American Heart Association: www.heart.org Contact a health care provider if: You want to change your plan for quitting. The medicines you are taking are not helping. Your eating feels out of control or you  cannot sleep. You feel depressed or become very anxious. Summary Quitting smoking is a physical and mental challenge. You will face cravings, withdrawal symptoms, and temptation to smoke again. Preparation can help you as you go through these challenges. Try different techniques to manage stress, handle social situations, and prevent weight gain. You can deal with cravings by keeping your mouth busy (such as by chewing gum), keeping your hands and body busy, calling family or friends, or contacting a quitline for people who want to quit smoking. You can deal with withdrawal symptoms by avoiding places where people smoke, getting plenty of rest, and avoiding drinks that contain caffeine. This information is not intended to replace advice given to you by your health care provider. Make sure you discuss any questions you have with your health care provider. Document Revised: 04/23/2021 Document Reviewed: 04/23/2021 Elsevier Patient Education  [place].

## 2022-08-12 ENCOUNTER — Other Ambulatory Visit: Payer: Self-pay | Admitting: Nurse Practitioner

## 2022-08-12 NOTE — Telephone Encounter (Signed)
Requested Prescriptions  Refused Prescriptions Disp Refills   buPROPion (WELLBUTRIN SR) 150 MG 12 hr tablet [Pharmacy Med Name: BUPROPION HCL SR 150 MG TABLET] 180 tablet 2    Sig: TAKE 1 TABLET ONCE DAILY BY MOUTH X3 DAYS THEN INCREASE TO 1 TABLET TWICE DAILY     Psychiatry: Antidepressants - bupropion Passed - 08/12/2022 10:30 AM      Passed - Cr in normal range and within 360 days    Creatinine, Ser  Date Value Ref Range Status  07/21/2022 1.26 0.76 - 1.27 mg/dL Final         Passed - AST in normal range and within 360 days    AST  Date Value Ref Range Status  07/21/2022 16 0 - 40 IU/L Final         Passed - ALT in normal range and within 360 days    ALT  Date Value Ref Range Status  07/21/2022 18 0 - 44 IU/L Final         Passed - Last BP in normal range    BP Readings from Last 1 Encounters:  07/21/22 113/70         Passed - Valid encounter within last 6 months    Recent Outpatient Visits           3 weeks ago Medicare annual wellness visit, subsequent   [place], Henrine Screws T, NP   2 months ago [place], Karen, NP   6 months ago Centrilobular emphysema [facility])   [place], Henrine Screws T, NP   12 months ago Centrilobular emphysema ([place])   [place], Henrine Screws T, NP   1 year ago Centrilobular emphysema ([place])   [place], [name], NP       Future Appointments             In 2 weeks [name], [name], NP [place], [facility]

## 2022-08-14 DIAGNOSIS — J449 Chronic obstructive pulmonary disease, unspecified: Secondary | ICD-10-CM

## 2022-08-14 DIAGNOSIS — I10 Essential (primary) hypertension: Secondary | ICD-10-CM | POA: Diagnosis not present

## 2022-08-14 DIAGNOSIS — R69 Illness, unspecified: Secondary | ICD-10-CM | POA: Diagnosis not present

## 2022-08-14 DIAGNOSIS — F17210 Nicotine dependence, cigarettes, uncomplicated: Secondary | ICD-10-CM | POA: Diagnosis not present

## 2022-08-16 ENCOUNTER — Telehealth: Payer: Self-pay

## 2022-08-16 NOTE — Progress Notes (Cosign Needed)
Care Management [facility] Coordination Services Pharmacy Team  Reason for Encounter: General adherence update   Contacted patient for general health update and medication adherence call.  Unsuccessful outreach. Left voicemail for patient to return call.   Recent office visits:  07/21/22-Jolene T. Harvest Dark, NP (PCP) Seen for Medicare annual wellness. Labs ordered. AMB Referral to [facility]. Patient denied prostate exam. Follow up in 6 weeks. 06/13/22-Karen Caren Griffins, NP. Seen for testicle pain. Labs ordered. Start on Levofloxacin.  Recent consult visits:  None noted  Hospital visits:  None in previous 6 months  Medications: Outpatient Encounter Medications as of 08/16/2022  Medication Sig   albuterol (PROAIR HFA) 108 (90 Base) MCG/ACT inhaler Inhale 2 puffs into the lungs every 6 (six) hours as needed for wheezing or shortness of breath.   amLODipine (NORVASC) 5 MG tablet Take 1 tablet (5 mg total) by mouth daily.   atorvastatin (LIPITOR) 20 MG tablet Take 1 tablet (20 mg total) by mouth daily.   buPROPion (WELLBUTRIN SR) 150 MG 12 hr tablet Take 150 MG (one tablet) once daily by mouth for 3 days; then increase to 150 MG (one tablet) twice daily by mouth.   lisinopril-hydrochlorothiazide (ZESTORETIC) 20-25 MG tablet Take 1 tablet by mouth daily.   umeclidinium-vilanterol (ANORO ELLIPTA) 62.5-25 MCG/ACT AEPB Inhale 1 puff into the lungs daily at 6 (six) AM.   No facility-administered encounter medications on file as of 08/16/2022.    Recent vitals BP Readings from Last 3 Encounters:  07/21/22 113/70  06/13/22 126/79  01/19/22 107/69   Pulse Readings from Last 3 Encounters:  07/21/22 82  06/13/22 (!) 102  01/19/22 72   Wt Readings from Last 3 Encounters:  07/25/22 192 lb (87.1 kg)  07/21/22 192 lb 4.8 oz (87.2 kg)  06/13/22 190 lb 12.8 oz (86.5 kg)   BMI Readings from Last 3 Encounters:  07/25/22 28.35 kg/m  07/21/22 28.56 kg/m  06/13/22 28.18 kg/m     Recent lab results    Component Value Date/Time   NA 140 07/21/2022 0856   K 4.4 07/21/2022 0856   CL 100 07/21/2022 0856   CO2 25 07/21/2022 0856   GLUCOSE 106 (H) 07/21/2022 0856   BUN 22 07/21/2022 0856   CREATININE 1.26 07/21/2022 0856   CALCIUM 9.5 07/21/2022 0856    Lab Results  Component Value Date   CREATININE 1.26 07/21/2022   EGFR 60 07/21/2022   GFRNONAA 73 06/29/2020   GFRAA 84 06/29/2020   Lab Results  Component Value Date/Time   MICROALBUR 80 (H) 07/21/2022 08:54 AM   MICROALBUR 30 (H) 04/28/2015 03:24 PM    Lab Results  Component Value Date   CHOL 138 07/21/2022   HDL 31 (L) 07/21/2022   LDLCALC 72 07/21/2022   TRIG 207 (H) 07/21/2022   Care Gaps: Annual wellness visit in last year? Yes  If Diabetic: Last eye exam / retinopathy screening:N/A Last diabetic foot exam:N/A Last UACR: N/A  Star Rating Drugs:  Atorvastatin 20 mg Last filled:06/06/22 90 DS, 02/19/22 90 DS   Myriam Carolin Coy, RMA

## 2022-08-19 ENCOUNTER — Other Ambulatory Visit: Payer: Medicare HMO

## 2022-08-19 DIAGNOSIS — R972 Elevated prostate specific antigen [PSA]: Secondary | ICD-10-CM

## 2022-08-20 ENCOUNTER — Other Ambulatory Visit: Payer: Self-pay | Admitting: Nurse Practitioner

## 2022-08-20 DIAGNOSIS — R972 Elevated prostate specific antigen [PSA]: Secondary | ICD-10-CM

## 2022-08-20 LAB — PSA, TOTAL AND FREE
PSA, Free Pct: 25.8 %
PSA, Free: 1.86 ng/mL
Prostate Specific Ag, Serum: 7.2 ng/mL — ABNORMAL HIGH (ref 0.0–4.0)

## 2022-08-20 NOTE — Progress Notes (Signed)
Good morning please let Brandon Hart know his prostate lab has returned and continues to be elevated above normal range for his age and ethnicity.  I suspect it may be more related to natural enlargement of prostate with aging, but since you are a smoker and higher cancer risk I will place a referral to urology for further assessment.  They will call you to schedule.  Please listen out for call.  Any questions? Keep being awesome!!  Thank you for allowing me to participate in your care.  I appreciate you. Kindest regards, Raji Glinski

## 2022-08-27 NOTE — Patient Instructions (Incomplete)
Living with COPD Being diagnosed with chronic obstructive pulmonary disease (COPD) changes your life physically and emotionally. Having COPD can affect your ability to work and do things you enjoy. COPD is not the same for everyone, and it may change over time. Your health care providers can help you come up with the COPD management plan that works best for you. How to manage lifestyle changes Treatment plan Work closely with your health care providers. Follow your COPD management plan. This plan includes: Instructions about activities, exercises, diet, medicines, what to do when COPD flares up, and when to call your health care provider. A pulmonary rehabilitation program. In pulmonary rehab, you will learn about COPD, do exercises for fitness and breathing, and get support from health care providers and other people who have COPD. Managing emotions and stress Living with a chronic disease means you may also struggle with stressful emotions, such as sadness, fear, and worry. Here are some ways to manage these emotions: Talk to someone about your fear, anxiety, depression, or stress. Learn strategies to avoid or reduce stress and ask for help if you are struggling with depression or anxiety. Consider joining a COPD support group, online or in person.  Adjusting to changes COPD may limit the things you can do, but you can make certain changes to help you cope with the diagnosis. Ask for help when you need it. Getting support from friends, family, and your health care team is an important part of managing the condition. Try to get regular exercise as prescribed by a health care provider or pulmonary rehab team. Exercising can help COPD, even if you are a bit short of breath. Take steps to prevent infection and protect your lungs: Wash your hands often and avoid being in crowds. Stay away from friends and family members who are sick. Check your local air quality each day, and stay out of areas  where air pollution is likely. How to recognize changes in your condition Recognizing changes in your COPD COPD is a progressive disease. It is important to let the health care team know if your COPD is getting worse. Your treatment plan may need to change. Watch for: Increased shortness of breath, wheezing, cough, or fatigue. Loss of ability to exercise or perform daily activities, like climbing stairs. More frequent symptom flares. Signs of depression or anxiety. Recognizing stress It is normal to have additional stress when you have COPD. However, prolonged stress and anxiety can make COPD worse and lead to depression. Recognize the warning signs, which include: Feeling sad or worried more often or most of the time. Having less energy and losing interest in pleasurable activities. Changes in your appetite or sleeping patterns. Being easily angered or irritated. Having unexplained aches and pains, digestive problems, or headaches. Follow these instructions at home: Eating and drinking  Eat foods that are high in fiber, such as fresh fruits and vegetables, whole grains, and beans. Limit foods that are high in fat and processed sugars, such as fried or sweet foods. Follow a balanced diet and maintain a healthy weight. Being overweight or underweight can make COPD worse. You may work with a dietitian as part of your pulmonary rehab program. Drink enough fluid to keep your urine pale yellow. If you drink alcohol: Limit how much you have to: 0-1 drink a day for women who are not pregnant. 0-2 drinks a day for men. Know how much alcohol is in your drink. In the [place] one drink equals one 12 oz bottle   of beer (355 mL), one 5 oz glass of wine (148 mL), or one 1 oz glass of hard liquor (44 mL). Lifestyle If you smoke, the most important thing that you can do is to stop smoking. Continuing to smoke will cause the disease to progress faster. Do not use any products that contain nicotine or  tobacco. These products include cigarettes, chewing tobacco, and vaping devices, such as e-cigarettes. If you need help quitting, ask your health care provider. Avoid exposure to things that irritate your lungs, such as smoke, chemicals, and fumes. Activity Balance exercise and rest. Take short walks every 1-2 hours. This is important to improve blood flow and breathing. Ask for help if you feel weak or unsteady. Do exercises that include controlled breathing with body movement, such as tai chi. General instructions Take over-the-counter and prescription medicines only as told by your health care provider. Take vitamin and protein supplements as told by your health care provider or dietitian. Practice good oral hygiene and see your dental care provider regularly. An oral infection can also spread to your lungs. Make sure you receive all the vaccines that your health care provider recommends. Keep all follow-up visits. This is important. Contact a health care provider if you: Are struggling to manage your COPD. Have emotional stress that interferes with your ability to cope with COPD. Get help right away if you: Have thoughts of suicide, death, or hurting yourself or others. If you ever feel like you may hurt yourself or others, or have thoughts about taking your own life, get help right away. Go to your nearest emergency department or: Call your local emergency services (911 in the [place] Call a suicide crisis helpline, such as the National Suicide Prevention Lifeline at 1-800-273-8255 or 988 in the [place] This is open 24 hours a day in the [place] Text the Crisis Text Line at 741741 (in the [place] Summary Being diagnosed with chronic obstructive pulmonary disease (COPD) changes your life physically and emotionally. Work with your health care providers and follow your COPD management plan. A pulmonary rehabilitation program is an important part of COPD management. Prolonged stress, anxiety, and  depression can make COPD worse. Let your health care provider know if emotional stress interferes with your ability to cope with and manage COPD. This information is not intended to replace advice given to you by your health care provider. Make sure you discuss any questions you have with your health care provider. Document Revised: 11/25/2020 Document Reviewed: 05/20/2020 Elsevier Patient Education  2023 Elsevier Inc.  

## 2022-09-01 ENCOUNTER — Ambulatory Visit (INDEPENDENT_AMBULATORY_CARE_PROVIDER_SITE_OTHER): Payer: Medicare HMO | Admitting: Nurse Practitioner

## 2022-09-01 ENCOUNTER — Encounter: Payer: Self-pay | Admitting: Nurse Practitioner

## 2022-09-01 VITALS — BP 120/78 | HR 74 | Temp 97.6°F | Ht 68.82 in | Wt 193.0 lb

## 2022-09-01 DIAGNOSIS — F17210 Nicotine dependence, cigarettes, uncomplicated: Secondary | ICD-10-CM

## 2022-09-01 DIAGNOSIS — J432 Centrilobular emphysema: Secondary | ICD-10-CM | POA: Diagnosis not present

## 2022-09-01 DIAGNOSIS — R69 Illness, unspecified: Secondary | ICD-10-CM | POA: Diagnosis not present

## 2022-09-01 NOTE — Assessment & Plan Note (Signed)
Chronic, ongoing.  Continue to recommend complete smoking cessation.  At this time continue Anoro daily as is offering benefit and lungs clearer on exam today.  March 2024 FEV1 59% and FEV1/FVC 97%.  Educated him at length on emphysema and progressive nature of disease.   Continue annual lung screening.  Spirometry up to date, due next March 2025.

## 2022-09-01 NOTE — Assessment & Plan Note (Signed)
Did not tolerate Wellbutrin.  I have recommended complete cessation of tobacco use. I have discussed various options available for assistance with tobacco cessation including over the counter methods (Nicotine gum, patch and lozenges). We also discussed prescription options (Chantix, Nicotine Inhaler / Nasal Spray). The patient is not interested in pursuing any prescription tobacco cessation options at this time.

## 2022-09-01 NOTE — Progress Notes (Signed)
BP 120/78   Pulse 74   Temp 97.6 F (36.4 C) (Oral)   Ht 5' 8.82" (1.748 m)   Wt 193 lb (87.5 kg)   SpO2 95%   BMI 28.65 kg/m    Subjective:    Patient ID: Brandon Hart, male    DOB: [birth year], 77 y.o.   MRN: [id number]  HPI: Brandon Hart is a 77 y.o. male  Chief Complaint  Patient presents with   COPD   smoking Cessation    Was prescribed Wellbutrin, was unable to continue taking, med started keeping him awake at night. Patient states that he is still currently smoking,   COPD Follow-up today for COPD and smoking -- started Wellbutrin for cessation last visit and placed new referral to CCM team to work on assistance for an inhaler regimen.  Had to stop Wellbutrin as throat was dry and could not sleep with it, although he did not feel it was helping.  Continues to smoke 1/2 pack per day, used to smoke 1 PPD. Lung CT screening last 07/15/20 continuing to note centrilobular and paraseptal emphysema + aortic atherosclerosis.     Currently only uses Albuterol --- has not had to use yet.  Tried to place on Anoro and Stiolto in past, but too costly.  CCM is working on assistance for [employer] with patient -- is currently taking this.  He reports Anoro seems to be helping. COPD status: stable Satisfied with current treatment?: yes Oxygen use: no Dyspnea frequency: no Cough frequency: no Rescue inhaler frequency:  none, has not used Limitation of activity: no Productive cough: no Last Spirometry: 07/21/22 Pneumovax:  Refuses Influenza: Refuse   Relevant past medical, surgical, family and social history reviewed and updated as indicated. Interim medical history since our last visit reviewed. Allergies and medications reviewed and updated.  Review of Systems  Constitutional:  Negative for activity change, diaphoresis, fatigue and fever.  Respiratory:  Negative for cough, chest tightness, shortness of breath and wheezing.   Cardiovascular:  Negative for chest pain,  palpitations and leg swelling.  Gastrointestinal: Negative.   Neurological: Negative.   Psychiatric/Behavioral: Negative.      Per HPI unless specifically indicated above     Objective:    BP 120/78   Pulse 74   Temp 97.6 F (36.4 C) (Oral)   Ht 5' 8.82" (1.748 m)   Wt 193 lb (87.5 kg)   SpO2 95%   BMI 28.65 kg/m   Wt Readings from Last 3 Encounters:  09/01/22 193 lb (87.5 kg)  07/25/22 192 lb (87.1 kg)  07/21/22 192 lb 4.8 oz (87.2 kg)    Physical Exam Vitals and nursing note reviewed.  Constitutional:      General: He is awake. He is not in acute distress.    Appearance: He is well-developed and well-groomed. He is not ill-appearing or toxic-appearing.  HENT:     Head: Normocephalic and atraumatic.     Right Ear: Hearing normal. No drainage.     Left Ear: Hearing normal. No drainage.  Eyes:     General: Lids are normal.        Right eye: No discharge.        Left eye: No discharge.     Conjunctiva/sclera: Conjunctivae normal.     Pupils: Pupils are equal, round, and reactive to light.  Neck:     Thyroid: No thyromegaly.     Vascular: No carotid bruit.  Cardiovascular:     Rate and Rhythm:  Normal rate and regular rhythm.     Heart sounds: Normal heart sounds, S1 normal and S2 normal. No murmur heard.    No gallop.  Pulmonary:     Effort: Pulmonary effort is normal. No accessory muscle usage or respiratory distress.     Breath sounds: Normal breath sounds. No decreased breath sounds, wheezing, rhonchi or rales.     Comments: Lungs overall clearer on exam today. Abdominal:     General: Bowel sounds are normal.     Palpations: Abdomen is soft.  Musculoskeletal:     Right hand: Normal pulse.     Cervical back: Normal range of motion and neck supple.     Right lower leg: No edema.     Left lower leg: No edema.  Lymphadenopathy:     Cervical: No cervical adenopathy.  Skin:    General: Skin is warm and dry.     Capillary Refill: Capillary refill takes less  than 2 seconds.  Neurological:     Mental Status: He is alert and oriented to person, place, and time.     Deep Tendon Reflexes: Reflexes are normal and symmetric.  Psychiatric:        Attention and Perception: Attention normal.        Mood and Affect: Mood normal.        Speech: Speech normal.        Behavior: Behavior normal. Behavior is cooperative.        Thought Content: Thought content normal.     Results for orders placed or performed in visit on 08/19/22  PSA, total and free  Result Value Ref Range   Prostate Specific Ag, Serum 7.2 (H) 0.0 - 4.0 ng/mL   PSA, Free 1.86 N/A ng/mL   PSA, Free Pct 25.8 %      Assessment & Plan:   Problem List Items Addressed This Visit       Respiratory   Centrilobular emphysema - Primary    Chronic, ongoing.  Continue to recommend complete smoking cessation.  At this time continue Anoro daily as is offering benefit and lungs clearer on exam today.  March 2024 FEV1 59% and FEV1/FVC 97%.  Educated him at length on emphysema and progressive nature of disease.   Continue annual lung screening.  Spirometry up to date, due next March 2025.        Other   Nicotine dependence, cigarettes, uncomplicated    Did not tolerate Wellbutrin.  I have recommended complete cessation of tobacco use. I have discussed various options available for assistance with tobacco cessation including over the counter methods (Nicotine gum, patch and lozenges). We also discussed prescription options (Chantix, Nicotine Inhaler / Nasal Spray). The patient is not interested in pursuing any prescription tobacco cessation options at this time.         Follow up plan: Return in about 5 months (around 02/01/2023) for COPD, HTN/HLD, VIT D.

## 2022-09-19 ENCOUNTER — Telehealth: Payer: Self-pay

## 2022-09-19 NOTE — Progress Notes (Signed)
Care Management & Coordination Services Pharmacy Team  Reason for Encounter: General adherence update   Contacted patient for general health update and medication adherence call.  Spoke with patient on 09/26/2022    What concerns do you have about your medications?None  The patient denies side effects with their medications.   How often do you forget or accidentally miss a dose? Rarely,  Do you use a pillbox? No  Are you having any problems getting your medications from your pharmacy? No  Has the cost of your medications been a concern? No If yes, what medication and is patient assistance available or has it been applied for?  Since last visit with PharmD, no interventions have been made.   The patient has not had an ED visit since last contact.   The patient denies problems with their health.   Patient denies concerns or questions for, PharmD at this time.   Counseled patient on: Importance of taking medication daily without missed doses   Chart Updates:  Recent office visits:  09/01/22 Aura Dials, NP (COPD) Orders: None, Medication changes: none  Recent consult visits:  None since the last coordination call  Hospital visits:  None in previous 6 months  Medications: Outpatient Encounter Medications as of 09/19/2022  Medication Sig   albuterol (PROAIR HFA) 108 (90 Base) MCG/ACT inhaler Inhale 2 puffs into the lungs every 6 (six) hours as needed for wheezing or shortness of breath.   amLODipine (NORVASC) 5 MG tablet Take 1 tablet (5 mg total) by mouth daily.   atorvastatin (LIPITOR) 20 MG tablet Take 1 tablet (20 mg total) by mouth daily.   lisinopril-hydrochlorothiazide (ZESTORETIC) 20-25 MG tablet Take 1 tablet by mouth daily.   umeclidinium-vilanterol (ANORO ELLIPTA) 62.5-25 MCG/ACT AEPB Inhale 1 puff into the lungs daily at 6 (six) AM.   No facility-administered encounter medications on file as of 09/19/2022.    Recent vitals BP Readings from Last 3  Encounters:  09/01/22 120/78  07/21/22 113/70  06/13/22 126/79   Pulse Readings from Last 3 Encounters:  09/01/22 74  07/21/22 82  06/13/22 (!) 102   Wt Readings from Last 3 Encounters:  09/01/22 193 lb (87.5 kg)  07/25/22 192 lb (87.1 kg)  07/21/22 192 lb 4.8 oz (87.2 kg)   BMI Readings from Last 3 Encounters:  09/01/22 28.65 kg/m  07/25/22 28.35 kg/m  07/21/22 28.56 kg/m    Recent lab results    Component Value Date/Time   NA 140 07/21/2022 0856   K 4.4 07/21/2022 0856   CL 100 07/21/2022 0856   CO2 25 07/21/2022 0856   GLUCOSE 106 (H) 07/21/2022 0856   BUN 22 07/21/2022 0856   CREATININE 1.26 07/21/2022 0856   CALCIUM 9.5 07/21/2022 0856    Lab Results  Component Value Date   CREATININE 1.26 07/21/2022   EGFR 60 07/21/2022   GFRNONAA 73 06/29/2020   GFRAA 84 06/29/2020   Lab Results  Component Value Date/Time   MICROALBUR 80 (H) 07/21/2022 08:54 AM   MICROALBUR 30 (H) 04/28/2015 03:24 PM    Lab Results  Component Value Date   CHOL 138 07/21/2022   HDL 31 (L) 07/21/2022   LDLCALC 72 07/21/2022   TRIG 207 (H) 07/21/2022    Care Gaps: Annual wellness visit in last year? Yes  If Diabetic: Last eye exam / retinopathy screening:N/A Last diabetic foot exam:N/A Last UACR: N/A  Star Rating Drugs:  Medication:  Last Fill: Day Supply Atorvastatin 20 mg Last filled:08/30/22 90 DS,  06/06/21 90 DS    Velvet Bathe

## 2022-10-05 ENCOUNTER — Ambulatory Visit (INDEPENDENT_AMBULATORY_CARE_PROVIDER_SITE_OTHER): Payer: Medicare HMO

## 2022-10-05 ENCOUNTER — Telehealth: Payer: Medicare HMO

## 2022-10-05 DIAGNOSIS — I10 Essential (primary) hypertension: Secondary | ICD-10-CM

## 2022-10-05 DIAGNOSIS — J432 Centrilobular emphysema: Secondary | ICD-10-CM

## 2022-10-05 NOTE — Chronic Care Management (AMB) (Signed)
Chronic Care Management   CCM RN Visit Note  10/05/2022 Name: Brandon Hart MRN: [id number] DOB: 1945-08-29  Subjective: Brandon Hart is a 77 y.o. year old male who is a primary care patient of [name], Dorie Rank, NP. The patient was referred to the Chronic Care Management team for assistance with care management needs subsequent to provider initiation of CCM services and plan of care.    Today's Visit:  Engaged with patient by telephone for follow up visit.        Goals Addressed             This Visit's Progress    CCM Expected Outcome:  Monitor, Self-Manage, and Reduce Symptoms of Hypertension       Current Barriers:  Knowledge Deficits related to abnormal labs and how to manage effectively with HTN and other chronic conditions associated with HLD and heart health Chronic Disease Management support and education needs related to effective management of HTN BP Readings from Last 3 Encounters:  09/01/22 120/78  07/21/22 113/70  06/13/22 126/79     Planned Interventions: Evaluation of current treatment plan related to hypertension self management and patient's adherence to plan as established by provider. The patients blood pressures are stable. Saw the pcp in April. Denies any acute changes at this time.    Provided education to patient re: stroke prevention, s/s of heart attack and stroke. Education on how controlling blood pressures and cholesterol levels reduce the risk of heart attack and stroke. ; Reviewed prescribed diet heart healthy diet, the patient states he does not add salt to his food but he really eats what he wants to eat. Denies any issues with swelling in his feet and legs.  Reviewed medications with patient and discussed importance of compliance. The patient is compliant with medications. The patient states compliance with medications. Denies any acute findings or changes with medications. Could not tolerate the Wellbutrin for  helping with  smoking cessation.  Discussed plans with patient for ongoing care management follow up and provided patient with direct contact information for care management team; Advised patient, providing education and rationale, to monitor blood pressure daily and record, calling PCP for findings outside established parameters;  Reviewed scheduled/upcoming provider appointments including: 02-01-2023 at 0940 am Advised patient to discuss changes in his blood pressures, changes in his lab values for cholesterol, questions or concerns with provider; Provided education on prescribed diet heart healthy diet, monitoring for starchy foods that can increase triglyceride levels, staying away from fried foods. Received information in mail on heart healthy diet. Discussed complications of poorly controlled blood pressure such as heart disease, stroke, circulatory complications, vision complications, kidney impairment, sexual dysfunction;  Screening for signs and symptoms of depression related to chronic disease state;  Assessed social determinant of health barriers;  Smoker >50 years. Was taking medications but this was causing insomnia. The patient states he has tried to cut back. Is working with the pcp. Will send information in mail for help with smoking cessation  Symptom Management: Take medications as prescribed   Attend all scheduled provider appointments Call provider office for new concerns or questions  call the Suicide and Crisis Lifeline: 988 call the [place] National Suicide Prevention Lifeline: 210-045-1310 or TTY: 253-162-5944 TTY 6782638552) to talk to a trained counselor call 1-800-273-TALK (toll free, 24 hour hotline) if experiencing a Mental Health or Behavioral Health Crisis  learn about high blood pressure develop an action plan for high blood pressure keep all doctor appointments take  medications for blood pressure exactly as prescribed report new symptoms to your doctor  Follow Up Plan:  Telephone follow up appointment with care management team member scheduled for: 12-07-2022 at 0945 am       CCM:  Maintain, Monitor and Self-Manage Symptoms of COPD       Current Barriers:  Care Coordination needs related to inhaler cost and needs from pharm D assistance with obtaining inhaler in a patient with COPD Chronic Disease Management support and education needs related to effective management of COPD Current every day smoker. Working with pcp on smoking cessation  Planned Interventions: Provided patient with basic written and verbal COPD education on self care/management/and exacerbation prevention. The patient states that he has a cough in the am but other than that no issues related to his breathing. He is mindful of changes and what to monitor for. Advised patient to track and manage COPD triggers. Review of triggers. The patient denies any acute changes in his environment causing problems for his breathing.  Provided written and verbal instructions on pursed lip breathing and utilized returned demonstration as teach back Provided instruction about proper use of medications used for management of COPD including inhalers. The patient has received his Anoro inhaler and has worked with the pharm D on cost constraints. The patient was able to get his inhaler at $0 co-pay. The patient was thankful for this. Denies any questions related to inhaler use. States he is using his inhalers without any issues. Education and support provided.  Advised patient to self assesses COPD action plan zone and make appointment with provider if in the yellow zone for 48 hours without improvement Advised patient to engage in light exercise as tolerated 3-5 days a week to aid in the the management of COPD Provided education about and advised patient to utilize infection prevention strategies to reduce risk of respiratory infection Discussed the importance of adequate rest and management of fatigue with  COPD Screening for signs and symptoms of depression related to chronic disease state  Assessed social determinant of health barriers Will send the patient smoking cessation material in the mail to assist with education and help with smoking cessation. The patient is actively working with the pcp on smoking cessation  Symptom Management: Take medications as prescribed   Attend all scheduled provider appointments Call provider office for new concerns or questions  call the Suicide and Crisis Lifeline: 988 call the [place] National Suicide Prevention Lifeline: 613-605-6537 or TTY: 5708586069 TTY 810 758 9266) to talk to a trained counselor call 1-800-273-TALK (toll free, 24 hour hotline) if experiencing a Mental Health or Behavioral Health Crisis  avoid second hand smoke eliminate smoking in my home identify and remove indoor air pollutants limit outdoor activity during cold weather listen for public air quality announcements every day do breathing exercises every day develop a rescue plan eliminate symptom triggers at home follow rescue plan if symptoms flare-up  Follow Up Plan: Telephone follow up appointment with care management team member scheduled for: 12-07-2022 at 0945 am          Plan:Telephone follow up appointment with care management team member scheduled for:  12-07-2022 at 0945 am  Alto Denver RN, MSN, CCM RN Care Manager  Chronic Care Management Direct Number: [phone]

## 2022-10-05 NOTE — Patient Instructions (Signed)
Please call the care guide team at [phone] if you need to cancel or reschedule your appointment.   If you are experiencing a Mental Health or Behavioral Health Crisis or need someone to talk to, please call the Suicide and Crisis Lifeline: 988 call the [place] National Suicide Prevention Lifeline: 604-218-5530 or TTY: (519) 445-8278 TTY 334-048-4091) to talk to a trained counselor call 1-800-273-TALK (toll free, 24 hour hotline)   Following is a copy of the CCM Program Consent:  CCM service includes personalized support from designated clinical staff supervised by the physician, including individualized plan of care and coordination with other care providers 24/7 contact phone numbers for assistance for urgent and routine care needs. Service will only be billed when office clinical staff spend 20 minutes or more in a month to coordinate care. Only one practitioner may furnish and bill the service in a calendar month. The patient may stop CCM services at amy time (effective at the end of the month) by phone call to the office staff. The patient will be responsible for cost sharing (co-pay) or up to 20% of the service fee (after annual deductible is met)  Following is a copy of your full provider care plan:   Goals Addressed             This Visit's Progress    CCM Expected Outcome:  Monitor, Self-Manage, and Reduce Symptoms of Hypertension       Current Barriers:  Knowledge Deficits related to abnormal labs and how to manage effectively with HTN and other chronic conditions associated with HLD and heart health Chronic Disease Management support and education needs related to effective management of HTN BP Readings from Last 3 Encounters:  09/01/22 120/78  07/21/22 113/70  06/13/22 126/79     Planned Interventions: Evaluation of current treatment plan related to hypertension self management and patient's adherence to plan as established by provider. The patients blood pressures are  stable. Saw the pcp in April. Denies any acute changes at this time.    Provided education to patient re: stroke prevention, s/s of heart attack and stroke. Education on how controlling blood pressures and cholesterol levels reduce the risk of heart attack and stroke. ; Reviewed prescribed diet heart healthy diet, the patient states he does not add salt to his food but he really eats what he wants to eat. Denies any issues with swelling in his feet and legs.  Reviewed medications with patient and discussed importance of compliance. The patient is compliant with medications. The patient states compliance with medications. Denies any acute findings or changes with medications. Could not tolerate the Wellbutrin for  helping with smoking cessation.  Discussed plans with patient for ongoing care management follow up and provided patient with direct contact information for care management team; Advised patient, providing education and rationale, to monitor blood pressure daily and record, calling PCP for findings outside established parameters;  Reviewed scheduled/upcoming provider appointments including: 02-01-2023 at 0940 am Advised patient to discuss changes in his blood pressures, changes in his lab values for cholesterol, questions or concerns with provider; Provided education on prescribed diet heart healthy diet, monitoring for starchy foods that can increase triglyceride levels, staying away from fried foods. Received information in mail on heart healthy diet. Discussed complications of poorly controlled blood pressure such as heart disease, stroke, circulatory complications, vision complications, kidney impairment, sexual dysfunction;  Screening for signs and symptoms of depression related to chronic disease state;  Assessed social determinant of health barriers;  Smoker >  50 years. Was taking medications but this was causing insomnia. The patient states he has tried to cut back. Is working with the  pcp. Will send information in mail for help with smoking cessation  Symptom Management: Take medications as prescribed   Attend all scheduled provider appointments Call provider office for new concerns or questions  call the Suicide and Crisis Lifeline: 988 call the [place] National Suicide Prevention Lifeline: 401 603 0860 or TTY: 430-295-6402 TTY 445-304-3327) to talk to a trained counselor call 1-800-273-TALK (toll free, 24 hour hotline) if experiencing a Mental Health or Behavioral Health Crisis  learn about high blood pressure develop an action plan for high blood pressure keep all doctor appointments take medications for blood pressure exactly as prescribed report new symptoms to your doctor  Follow Up Plan: Telephone follow up appointment with care management team member scheduled for: 12-07-2022 at 0945 am       CCM:  Maintain, Monitor and Self-Manage Symptoms of COPD       Current Barriers:  Care Coordination needs related to inhaler cost and needs from pharm D assistance with obtaining inhaler in a patient with COPD Chronic Disease Management support and education needs related to effective management of COPD Current every day smoker. Working with pcp on smoking cessation  Planned Interventions: Provided patient with basic written and verbal COPD education on self care/management/and exacerbation prevention. The patient states that he has a cough in the am but other than that no issues related to his breathing. He is mindful of changes and what to monitor for. Advised patient to track and manage COPD triggers. Review of triggers. The patient denies any acute changes in his environment causing problems for his breathing.  Provided written and verbal instructions on pursed lip breathing and utilized returned demonstration as teach back Provided instruction about proper use of medications used for management of COPD including inhalers. The patient has received his Anoro inhaler and  has worked with the pharm D on cost constraints. The patient was able to get his inhaler at $0 co-pay. The patient was thankful for this. Denies any questions related to inhaler use. States he is using his inhalers without any issues. Education and support provided.  Advised patient to self assesses COPD action plan zone and make appointment with provider if in the yellow zone for 48 hours without improvement Advised patient to engage in light exercise as tolerated 3-5 days a week to aid in the the management of COPD Provided education about and advised patient to utilize infection prevention strategies to reduce risk of respiratory infection Discussed the importance of adequate rest and management of fatigue with COPD Screening for signs and symptoms of depression related to chronic disease state  Assessed social determinant of health barriers Will send the patient smoking cessation material in the mail to assist with education and help with smoking cessation. The patient is actively working with the pcp on smoking cessation  Symptom Management: Take medications as prescribed   Attend all scheduled provider appointments Call provider office for new concerns or questions  call the Suicide and Crisis Lifeline: 988 call the [place] National Suicide Prevention Lifeline: (612)288-1187 or TTY: (832) 410-4428 TTY (367)693-6431) to talk to a trained counselor call 1-800-273-TALK (toll free, 24 hour hotline) if experiencing a Mental Health or Behavioral Health Crisis  avoid second hand smoke eliminate smoking in my home identify and remove indoor air pollutants limit outdoor activity during cold weather listen for public air quality announcements every day do breathing exercises  every day develop a rescue plan eliminate symptom triggers at home follow rescue plan if symptoms flare-up  Follow Up Plan: Telephone follow up appointment with care management team member scheduled for: 12-07-2022 at 0945 am           The patient verbalized understanding of instructions, educational materials, and care plan provided today and DECLINED offer to receive copy of patient instructions, educational materials, and care plan.  Telephone follow up appointment with care management team member scheduled for: 12-07-2022 at 0945 am

## 2022-10-14 DIAGNOSIS — J449 Chronic obstructive pulmonary disease, unspecified: Secondary | ICD-10-CM | POA: Diagnosis not present

## 2022-10-14 DIAGNOSIS — I10 Essential (primary) hypertension: Secondary | ICD-10-CM

## 2022-10-14 DIAGNOSIS — F17210 Nicotine dependence, cigarettes, uncomplicated: Secondary | ICD-10-CM | POA: Diagnosis not present

## 2022-12-04 ENCOUNTER — Other Ambulatory Visit: Payer: Self-pay

## 2022-12-04 ENCOUNTER — Emergency Department
Admission: EM | Admit: 2022-12-04 | Discharge: 2022-12-04 | Disposition: A | Discharge status: Home / Self Care | Payer: Medicare HMO | Attending: Emergency Medicine | Admitting: Emergency Medicine

## 2022-12-04 ENCOUNTER — Encounter: Payer: Self-pay | Admitting: Emergency Medicine

## 2022-12-04 DIAGNOSIS — H43391 Other vitreous opacities, right eye: Secondary | ICD-10-CM | POA: Diagnosis not present

## 2022-12-04 DIAGNOSIS — I10 Essential (primary) hypertension: Secondary | ICD-10-CM | POA: Insufficient documentation

## 2022-12-04 DIAGNOSIS — R944 Abnormal results of kidney function studies: Secondary | ICD-10-CM | POA: Insufficient documentation

## 2022-12-04 DIAGNOSIS — R55 Syncope and collapse: Secondary | ICD-10-CM | POA: Insufficient documentation

## 2022-12-04 DIAGNOSIS — E860 Dehydration: Secondary | ICD-10-CM | POA: Insufficient documentation

## 2022-12-04 DIAGNOSIS — D72829 Elevated white blood cell count, unspecified: Secondary | ICD-10-CM | POA: Insufficient documentation

## 2022-12-04 LAB — COMPREHENSIVE METABOLIC PANEL
ALT: 20 U/L (ref 0–44)
AST: 19 U/L (ref 15–41)
Albumin: 4.2 g/dL (ref 3.5–5.0)
Alkaline Phosphatase: 86 U/L (ref 38–126)
Anion gap: 7 (ref 5–15)
BUN: 26 mg/dL — ABNORMAL HIGH (ref 8–23)
CO2: 29 mmol/L (ref 22–32)
Calcium: 9.1 mg/dL (ref 8.9–10.3)
Chloride: 101 mmol/L (ref 98–111)
Creatinine, Ser: 1.49 mg/dL — ABNORMAL HIGH (ref 0.61–1.24)
GFR, Estimated: 49 mL/min — ABNORMAL LOW (ref >60)
Glucose, Bld: 168 mg/dL — ABNORMAL HIGH (ref 70–99)
Potassium: 3.6 mmol/L (ref 3.5–5.1)
Sodium: 137 mmol/L (ref 135–145)
Total Bilirubin: 0.7 mg/dL (ref 0.3–1.2)
Total Protein: 7.4 g/dL (ref 6.5–8.1)

## 2022-12-04 LAB — CBC
HCT: 40.1 % (ref 39.0–52.0)
Hemoglobin: 13.7 g/dL (ref 13.0–17.0)
MCH: 29.9 pg (ref 26.0–34.0)
MCHC: 34.2 g/dL (ref 30.0–36.0)
MCV: 87.6 fL (ref 80.0–100.0)
Platelets: 194 10*3/uL (ref 150–400)
RBC: 4.58 MIL/uL (ref 4.22–5.81)
RDW: 13 % (ref 11.5–15.5)
WBC: 12 10*3/uL — ABNORMAL HIGH (ref 4.0–10.5)
nRBC: 0 % (ref 0.0–0.2)

## 2022-12-04 LAB — CBG MONITORING, ED: Glucose-Capillary: 161 mg/dL — ABNORMAL HIGH (ref 70–99)

## 2022-12-04 NOTE — Discharge Instructions (Signed)

## 2022-12-04 NOTE — ED Provider Notes (Signed)
[facility] Provider Note    Event Date/Time   First MD Initiated Contact with Patient 12/04/22 1855     (approximate)   History   Altered Mental Status   HPI {Remember to add pertinent medical, surgical, social, and/or OB history to HPI:1} Brandon Hart is a 77 y.o. male  ***       Physical Exam   Triage Vital Signs: ED Triage Vitals  Encounter Vitals Group     BP 12/04/22 1558 131/82     Systolic BP Percentile --      Diastolic BP Percentile --      Pulse Rate 12/04/22 1558 93     Resp 12/04/22 1558 18     Temp 12/04/22 1558 98 F (36.7 C)     Temp Source 12/04/22 1558 Oral     SpO2 12/04/22 1558 95 %     Weight 12/04/22 1559 190 lb (86.2 kg)     Height 12/04/22 1559 5\' 9"  (1.753 m)     Head Circumference --      Peak Flow --      Pain Score 12/04/22 1559 0     Pain Loc --      Pain Education --      Exclude from Growth Chart --     Most recent vital signs: Vitals:   12/04/22 1558  BP: 131/82  Pulse: 93  Resp: 18  Temp: 98 F (36.7 C)  SpO2: 95%    {Only need to document appropriate and relevant physical exam:1} General: Awake, no distress. *** CV:  Good peripheral perfusion. *** Resp:  Normal effort. *** Abd:  No distention. *** Other:  ***   ED Results / Procedures / Treatments   Labs (all labs ordered are listed, but only abnormal results are displayed) Labs Reviewed  COMPREHENSIVE METABOLIC PANEL - Abnormal; Notable for the following components:      Result Value   Glucose, Bld 168 (*)    BUN 26 (*)    Creatinine, Ser 1.49 (*)    GFR, Estimated 49 (*)    All other components within normal limits  CBC - Abnormal; Notable for the following components:   WBC 12.0 (*)    All other components within normal limits  CBG MONITORING, ED - Abnormal; Notable for the following components:   Glucose-Capillary 161 (*)    All other components within normal limits  CBG MONITORING, ED     EKG  Interpreted  by me at 1602 heart rate 90 QRS 90 QTc 440 Normal sinus rhythm, very mild nonspecific T wave abnormality.  No evidence of acute ischemia.   RADIOLOGY *** {USE THE WORD "INTERPRETED"!! You MUST document your own interpretation of imaging, as well as the fact that you reviewed the radiologist's report!:1}   PROCEDURES:  Critical Care performed: {CriticalCareYesNo:19197::"Yes, see critical care procedure note(s)","No"}  Procedures   MEDICATIONS ORDERED IN ED: Medications - No data to display   IMPRESSION / MDM / ASSESSMENT AND PLAN / ED COURSE  I reviewed the triage vital signs and the nursing notes.                              Differential diagnosis includes, but is not limited to, dehydration, heat exposure, syncope from vasovagal or orthostatic cause etc.  He exhibits no symptoms of be highly suggestive of seizure.  He came to alert oriented within a very brief period  of time.  He was attended to and this was witnessed.  He did not fall or suffer any injury.  Occurred while sitting in a chair he is normocephalic atraumatic with normal neurologic cardiac and pulmonary exam and vital signs at this time.  Labs do reveal  Negative [place] syncope criteria.  No history of CHF, normal hematocrit, no acute ECG abnormalities concerning for ischemia, no shortness of breath, no hypotension in the ER  Patient's presentation is most consistent with {EM COPA:27473}  *** {If the patient is on the monitor, remove the brackets and asterisks on the sentence below and remember to document it as a Procedure as well. Otherwise delete the sentence below:1} {**The patient is on the cardiac monitor to evaluate for evidence of arrhythmia and/or significant heart rate changes.**} {Remember to include, when applicable, any/all of the following data: independent review of imaging independent review of labs (comment specifically on pertinent positives and negatives) review of specific prior  hospitalizations, PCP/specialist notes, etc. discuss meds given and prescribed document any discussion with consultants (including hospitalists) any clinical decision tools you used and why (PECARN, NEXUS, etc.) did you consider admitting the patient? document social determinants of health affecting patient's care (homelessness, inability to follow up in a timely fashion, etc) document any pre-existing conditions increasing risk on current visit (e.g. diabetes and HTN increasing danger of high-risk chest pain/ACS) describes what meds you gave (especially parenteral) and why any other interventions?:1}     FINAL CLINICAL IMPRESSION(S) / ED DIAGNOSES   Final diagnoses:  None     Rx / DC Orders   ED Discharge Orders     None        Note:  This document was prepared using Dragon voice recognition software and may include unintentional dictation errors.

## 2022-12-04 NOTE — ED Triage Notes (Signed)
Pt via POV with family reporting that pt had an episode of "unresponsive" behavior while seated with slow respirations, grunting, hypotension, diaphoresis, and loss of bladder control. Per family, pt was PERRL, slurred speech at time of the episode but has since returned to normal. Pt is currently a/o x 4 and says he feels normal with no pain. Vitals WNL. Pt denies hx of seizures but has had a similar episode years ago. PMH includes HTN, HLD.

## 2022-12-06 ENCOUNTER — Telehealth: Payer: Self-pay

## 2022-12-06 NOTE — Telephone Encounter (Signed)
Transition Care Management Follow-up Telephone Call Date of discharge and from where: 12/04/2022 [facility] How have you been since you were released from the hospital? Patient is feeling much better and is trying to drink more water. Any questions or concerns? No  Items Reviewed: Did the pt receive and understand the discharge instructions provided? Yes  Medications obtained and verified?  No medication prescribed. Other? No  Any new allergies since your discharge? No  Dietary orders reviewed? Yes Do you have support at home? Yes   Follow up appointments reviewed:  PCP Hospital f/u appt confirmed? Yes  Scheduled to see Aura Dials, NP on 12/13/2022 @ [facility] Specialist Hospital f/u appt confirmed? No  Scheduled to see  on  @ . Are transportation arrangements needed? No  If their condition worsens, is the pt aware to call PCP or go to the Emergency Dept.? Yes Was the patient provided with contact information for the PCP's office or ED? Yes Was to pt encouraged to call back with questions or concerns? Yes  Kelce Bouton [facility]  [facility] Population Health Community Resource Care Guide   ??[email or web address]  ?? [phone]   Website: [email or web address]

## 2022-12-07 ENCOUNTER — Telehealth: Payer: Medicare HMO

## 2022-12-07 ENCOUNTER — Ambulatory Visit (INDEPENDENT_AMBULATORY_CARE_PROVIDER_SITE_OTHER): Payer: Medicare HMO

## 2022-12-07 DIAGNOSIS — I10 Essential (primary) hypertension: Secondary | ICD-10-CM

## 2022-12-07 DIAGNOSIS — J432 Centrilobular emphysema: Secondary | ICD-10-CM

## 2022-12-07 NOTE — Chronic Care Management (AMB) (Signed)
Chronic Care Management   CCM RN Visit Note  12/07/2022 Name: Brandon Hart MRN: [id number] DOB: April 16, 1946  Subjective: Brandon Hart is a 77 y.o. year old male who is a primary care patient of [name], Dorie Rank, NP. The patient was referred to the Chronic Care Management team for assistance with care management needs subsequent to provider initiation of CCM services and plan of care.    Today's Visit:  Engaged with patient by telephone for follow up visit.        Goals Addressed             This Visit's Progress    CCM Expected Outcome:  Monitor, Self-Manage, and Reduce Symptoms of Hypertension       Current Barriers:  Knowledge Deficits related to abnormal labs and how to manage effectively with HTN and other chronic conditions associated with HLD and heart health Chronic Disease Management support and education needs related to effective management of HTN BP Readings from Last 3 Encounters:  12/04/22 131/82  09/01/22 120/78  07/21/22 113/70     Planned Interventions: Evaluation of current treatment plan related to hypertension self management and patient's adherence to plan as established by provider. The patients blood pressures are stable. Blood pressures are stable. Did have an episode of low blood pressure on the 21st when he was dehydrated and passed out at a pool birthday party. The patient was evaluated in the ER. The patient states that he has been drinking more water since that happened. Denies any acute findings today. Will see the pcp post ER visit on 12-13-2022 Provided education to patient re: stroke prevention, s/s of heart attack and stroke. Education on how controlling blood pressures and cholesterol levels reduce the risk of heart attack and stroke. ; Reviewed prescribed diet heart healthy diet, the patient states he does not add salt to his food but he really eats what he wants to eat. Denies any issues with swelling in his feet and legs.   Reviewed medications with patient and discussed importance of compliance. The patient is compliant with medications. The patient states compliance with medications. Denies any acute findings or changes with medications. Could not tolerate the Wellbutrin for  helping with smoking cessation.  Discussed plans with patient for ongoing care management follow up and provided patient with direct contact information for care management team; Advised patient, providing education and rationale, to monitor blood pressure daily and record, calling PCP for findings outside established parameters;  Reviewed scheduled/upcoming provider appointments including: hospital follow up on 12-13-2022 with the pcp, then 02-01-2023 at 0940 am Advised patient to discuss changes in his blood pressures, changes in his lab values for cholesterol, questions or concerns with provider; Provided education on prescribed diet heart healthy diet, monitoring for starchy foods that can increase triglyceride levels, staying away from fried foods. Received information in mail on heart healthy diet. Education on drinking more water in his diet. He likes to drink sweet tea, milk, and Mt. Dew. Review of monitoring for swelling in his feet and legs. Will continue to monitor for changes in dietary habits. Discussed complications of poorly controlled blood pressure such as heart disease, stroke, circulatory complications, vision complications, kidney impairment, sexual dysfunction;  Screening for signs and symptoms of depression related to chronic disease state;  Assessed social determinant of health barriers;  Smoker >50 years. Was taking medications but this was causing insomnia. The patient states he has tried to cut back. Is working with the pcp. Will send  information in mail for help with smoking cessation  Symptom Management: Take medications as prescribed   Attend all scheduled provider appointments Call provider office for new concerns or  questions  call the Suicide and Crisis Lifeline: 988 call the [place] National Suicide Prevention Lifeline: (309)492-2148 or TTY: 940-878-5296 TTY (508)276-4755) to talk to a trained counselor call 1-800-273-TALK (toll free, 24 hour hotline) if experiencing a Mental Health or Behavioral Health Crisis  learn about high blood pressure develop an action plan for high blood pressure keep all doctor appointments take medications for blood pressure exactly as prescribed report new symptoms to your doctor  Follow Up Plan: Telephone follow up appointment with care management team member scheduled for: 01-26-2023 at 0945 am       CCM:  Maintain, Monitor and Self-Manage Symptoms of COPD       Current Barriers:  Care Coordination needs related to inhaler cost and needs from pharm D assistance with obtaining inhaler in a patient with COPD Chronic Disease Management support and education needs related to effective management of COPD Current every day smoker. Working with pcp on smoking cessation  Planned Interventions: Provided patient with basic written and verbal COPD education on self care/management/and exacerbation prevention. The patient states that he has a cough in the am but other than that no issues related to his breathing. Had a recent ER visit where he was at a birthday party by the pool and he likely got too hot and passed out. He states he felt it coming on and he went out for a couple of minutes. He was evaluated in the ER. He has some heat exhaustion and dehydration. He states looking back he was likely dehydrated before he went to the party. He does not like to drink water. He usually drinks milk, tea, MT. Dew, and other things like that. Since his ER visit he has been drinking more water than usual. Education on making sure he is staying hydrated and not staying out in extreme heat for long periods of time. Education and support given. Will continue to monitor.  Advised patient to track and  manage COPD triggers. Review of triggers. The patient denies any acute changes in his environment causing problems for his breathing.  Provided written and verbal instructions on pursed lip breathing and utilized returned demonstration as teach back Provided instruction about proper use of medications used for management of COPD including inhalers. The patient has received his Anoro inhaler and has worked with the pharm D on cost constraints. The patient was able to get his inhaler at $0 co-pay. The patient was thankful for this. Denies any questions related to inhaler use. States he is using his inhalers without any issues. Education and support provided.  Advised patient to self assesses COPD action plan zone and make appointment with provider if in the yellow zone for 48 hours without improvement Advised patient to engage in light exercise as tolerated 3-5 days a week to aid in the the management of COPD Provided education about and advised patient to utilize infection prevention strategies to reduce risk of respiratory infection Discussed the importance of adequate rest and management of fatigue with COPD Screening for signs and symptoms of depression related to chronic disease state  Assessed social determinant of health barriers Will send the patient smoking cessation material in the mail to assist with education and help with smoking cessation. The patient is actively working with the pcp on smoking cessation  Symptom Management: Take medications as prescribed  Attend all scheduled provider appointments Call provider office for new concerns or questions  call the Suicide and Crisis Lifeline: 988 call the [place] National Suicide Prevention Lifeline: 3230998414 or TTY: 872-574-3430 TTY 510-780-1501) to talk to a trained counselor call 1-800-273-TALK (toll free, 24 hour hotline) if experiencing a Mental Health or Behavioral Health Crisis  avoid second hand smoke eliminate smoking in my  home identify and remove indoor air pollutants limit outdoor activity during cold weather listen for public air quality announcements every day do breathing exercises every day develop a rescue plan eliminate symptom triggers at home follow rescue plan if symptoms flare-up  Follow Up Plan: Telephone follow up appointment with care management team member scheduled for: 01-26-2023 at 0945 am          Plan:Telephone follow up appointment with care management team member scheduled for:  01-26-2023 at 0945 am  Alto Denver RN, MSN, CCM RN Care Manager  Chronic Care Management Direct Number: [phone]

## 2022-12-07 NOTE — Patient Instructions (Signed)
Please call the care guide team at [phone] if you need to cancel or reschedule your appointment.   If you are experiencing a Mental Health or Behavioral Health Crisis or need someone to talk to, please call the Suicide and Crisis Lifeline: 988 call the [place] National Suicide Prevention Lifeline: 770-211-9396 or TTY: 623-508-9701 TTY 276-161-1314) to talk to a trained counselor call 1-800-273-TALK (toll free, 24 hour hotline)   Following is a copy of the CCM Program Consent:  CCM service includes personalized support from designated clinical staff supervised by the physician, including individualized plan of care and coordination with other care providers 24/7 contact phone numbers for assistance for urgent and routine care needs. Service will only be billed when office clinical staff spend 20 minutes or more in a month to coordinate care. Only one practitioner may furnish and bill the service in a calendar month. The patient may stop CCM services at amy time (effective at the end of the month) by phone call to the office staff. The patient will be responsible for cost sharing (co-pay) or up to 20% of the service fee (after annual deductible is met)  Following is a copy of your full provider care plan:   Goals Addressed             This Visit's Progress    CCM Expected Outcome:  Monitor, Self-Manage, and Reduce Symptoms of Hypertension       Current Barriers:  Knowledge Deficits related to abnormal labs and how to manage effectively with HTN and other chronic conditions associated with HLD and heart health Chronic Disease Management support and education needs related to effective management of HTN BP Readings from Last 3 Encounters:  12/04/22 131/82  09/01/22 120/78  07/21/22 113/70     Planned Interventions: Evaluation of current treatment plan related to hypertension self management and patient's adherence to plan as established by provider. The patients blood pressures are  stable. Blood pressures are stable. Did have an episode of low blood pressure on the 21st when he was dehydrated and passed out at a pool birthday party. The patient was evaluated in the ER. The patient states that he has been drinking more water since that happened. Denies any acute findings today. Will see the pcp post ER visit on 12-13-2022 Provided education to patient re: stroke prevention, s/s of heart attack and stroke. Education on how controlling blood pressures and cholesterol levels reduce the risk of heart attack and stroke. ; Reviewed prescribed diet heart healthy diet, the patient states he does not add salt to his food but he really eats what he wants to eat. Denies any issues with swelling in his feet and legs.  Reviewed medications with patient and discussed importance of compliance. The patient is compliant with medications. The patient states compliance with medications. Denies any acute findings or changes with medications. Could not tolerate the Wellbutrin for  helping with smoking cessation.  Discussed plans with patient for ongoing care management follow up and provided patient with direct contact information for care management team; Advised patient, providing education and rationale, to monitor blood pressure daily and record, calling PCP for findings outside established parameters;  Reviewed scheduled/upcoming provider appointments including: hospital follow up on 12-13-2022 with the pcp, then 02-01-2023 at 0940 am Advised patient to discuss changes in his blood pressures, changes in his lab values for cholesterol, questions or concerns with provider; Provided education on prescribed diet heart healthy diet, monitoring for starchy foods that can increase triglyceride levels,  staying away from fried foods. Received information in mail on heart healthy diet. Education on drinking more water in his diet. He likes to drink sweet tea, milk, and Mt. Dew. Review of monitoring for swelling in  his feet and legs. Will continue to monitor for changes in dietary habits. Discussed complications of poorly controlled blood pressure such as heart disease, stroke, circulatory complications, vision complications, kidney impairment, sexual dysfunction;  Screening for signs and symptoms of depression related to chronic disease state;  Assessed social determinant of health barriers;  Smoker >50 years. Was taking medications but this was causing insomnia. The patient states he has tried to cut back. Is working with the pcp. Will send information in mail for help with smoking cessation  Symptom Management: Take medications as prescribed   Attend all scheduled provider appointments Call provider office for new concerns or questions  call the Suicide and Crisis Lifeline: 988 call the [place] National Suicide Prevention Lifeline: 445-286-0130 or TTY: 279-778-7930 TTY (332) 029-2811) to talk to a trained counselor call 1-800-273-TALK (toll free, 24 hour hotline) if experiencing a Mental Health or Behavioral Health Crisis  learn about high blood pressure develop an action plan for high blood pressure keep all doctor appointments take medications for blood pressure exactly as prescribed report new symptoms to your doctor  Follow Up Plan: Telephone follow up appointment with care management team member scheduled for: 01-26-2023 at 0945 am       CCM:  Maintain, Monitor and Self-Manage Symptoms of COPD       Current Barriers:  Care Coordination needs related to inhaler cost and needs from pharm D assistance with obtaining inhaler in a patient with COPD Chronic Disease Management support and education needs related to effective management of COPD Current every day smoker. Working with pcp on smoking cessation  Planned Interventions: Provided patient with basic written and verbal COPD education on self care/management/and exacerbation prevention. The patient states that he has a cough in the am but other  than that no issues related to his breathing. Had a recent ER visit where he was at a birthday party by the pool and he likely got too hot and passed out. He states he felt it coming on and he went out for a couple of minutes. He was evaluated in the ER. He has some heat exhaustion and dehydration. He states looking back he was likely dehydrated before he went to the party. He does not like to drink water. He usually drinks milk, tea, MT. Dew, and other things like that. Since his ER visit he has been drinking more water than usual. Education on making sure he is staying hydrated and not staying out in extreme heat for long periods of time. Education and support given. Will continue to monitor.  Advised patient to track and manage COPD triggers. Review of triggers. The patient denies any acute changes in his environment causing problems for his breathing.  Provided written and verbal instructions on pursed lip breathing and utilized returned demonstration as teach back Provided instruction about proper use of medications used for management of COPD including inhalers. The patient has received his Anoro inhaler and has worked with the pharm D on cost constraints. The patient was able to get his inhaler at $0 co-pay. The patient was thankful for this. Denies any questions related to inhaler use. States he is using his inhalers without any issues. Education and support provided.  Advised patient to self assesses COPD action plan zone and make appointment  with provider if in the yellow zone for 48 hours without improvement Advised patient to engage in light exercise as tolerated 3-5 days a week to aid in the the management of COPD Provided education about and advised patient to utilize infection prevention strategies to reduce risk of respiratory infection Discussed the importance of adequate rest and management of fatigue with COPD Screening for signs and symptoms of depression related to chronic disease state   Assessed social determinant of health barriers Will send the patient smoking cessation material in the mail to assist with education and help with smoking cessation. The patient is actively working with the pcp on smoking cessation  Symptom Management: Take medications as prescribed   Attend all scheduled provider appointments Call provider office for new concerns or questions  call the Suicide and Crisis Lifeline: 988 call the [place] National Suicide Prevention Lifeline: 587-207-3562 or TTY: 260-012-5875 TTY (626)414-5647) to talk to a trained counselor call 1-800-273-TALK (toll free, 24 hour hotline) if experiencing a Mental Health or Behavioral Health Crisis  avoid second hand smoke eliminate smoking in my home identify and remove indoor air pollutants limit outdoor activity during cold weather listen for public air quality announcements every day do breathing exercises every day develop a rescue plan eliminate symptom triggers at home follow rescue plan if symptoms flare-up  Follow Up Plan: Telephone follow up appointment with care management team member scheduled for: 01-26-2023 at 0945 am          The patient verbalized understanding of instructions, educational materials, and care plan provided today and DECLINED offer to receive copy of patient instructions, educational materials, and care plan.  Telephone follow up appointment with care management team member scheduled for: 01-26-2023 at 0945 am

## 2022-12-11 NOTE — Patient Instructions (Incomplete)
LIFE ALERT NUMBER =  [phone]  Dehydration, Adult Dehydration is a condition in which there is not enough water or other fluids in the body. This happens when a person loses more fluids than they take in. Important organs cannot work right without the right amount of fluids. Any loss of fluids from the body can cause dehydration. Dehydration can be mild, worse, or very bad. It should be treated right away to keep it from getting very bad. What are the causes? Conditions that cause loss of water in the body. They include: Watery poop (diarrhea). Vomiting. Sweating a lot. Fever. Infection. Peeing (urinating) a lot. Not drinking enough fluids. Certain medicines, such as medicines that take extra fluid out of the body (diuretics). Lack of safe drinking water. Not being able to get enough water and food. What increases the risk? Having a long-term (chronic) illness that has not been treated the right way, such as: Diabetes. Heart disease. Kidney disease. Being 45 years of age or older. Having a disability. Living in a place that is high above the ground or sea (high in altitude). The thinner, drier air causes more fluid loss. Doing exercises that put stress on your body for a long time. Being active when in hot places. What are the signs or symptoms? Symptoms of dehydration depend on how bad it is. Mild or worse dehydration Thirst. Dry lips or dry mouth. Feeling dizzy or light-headed. Muscle cramps. Passing little pee or dark pee. Pee may be the color of tea. Headache. Very bad dehydration Changes in skin. Skin may: Be cold to the touch (clammy). Be blotchy or pale. Not go back to normal right after you pinch it and let it go. Little or no tears, pee, or sweat. Fast breathing. Low blood pressure. Weak pulse. Pulse that is more than 100 beats a minute when you are sitting still. Other changes, such as: Feeling very thirsty. Eyes that look hollow (sunken). Cold hands  and feet. Being confused. Being very tired (lethargic) or having trouble waking from sleep. Losing weight. Loss of consciousness. How is this treated? Treatment for this condition depends on how bad your dehydration is. Treatment should start right away. Do not wait until your condition gets very bad. Very bad dehydration is an emergency. You will need to go to a hospital. Mild or worse dehydration can be treated at home. You may be asked to: Drink more fluids. Drink an oral rehydration solution (ORS). This drink gives you the right amount of fluids, salts, and minerals (electrolytes). Very bad dehydration can be treated: With fluids through an IV tube. By correcting low levels of electrolytes in the body. By treating the problem that caused your dehydration. Follow these instructions at home: Oral rehydration solution If told by your doctor, drink an ORS: Make an ORS. Use instructions on the package. Start by drinking small amounts, about  cup (120 mL) every 5-10 minutes. Slowly drink more until you have had the amount that your doctor said to have.  Eating and drinking  Drink enough clear fluid to keep your pee pale yellow. If you were told to drink an ORS, finish the ORS first. Then, start slowly drinking other clear fluids. Drink fluids such as: Water. Do not drink only water. Doing that can make the salt (sodium) level in your body get too low. Water from ice chips you suck on. Fruit juice that you have added water to (diluted). Low-calorie sports drinks. Eat foods that have the right amounts of salts  and minerals, such as bananas, oranges, potatoes, tomatoes, or spinach. Do not drink alcohol. Avoid drinks that have caffeine or sugar. These include:: High-calorie sports drinks. Fruit juice that you did not add water to. Soda. Coffee or energy drinks. Avoid foods that are greasy or have a lot of fat or sugar. General instructions Take over-the-counter and prescription  medicines only as told by your doctor. Do not take sodium tablets. Doing that can make the salt level in your body get too high. Return to your normal activities as told by your doctor. Ask your doctor what activities are safe for you. Keep all follow-up visits. Your doctor may check and change your treatment. Contact a doctor if: You have pain in your belly (abdomen) and the pain: Gets worse. Stays in one place. You have a rash. You have a stiff neck. You get angry or annoyed more easily than normal. You are more tired or have a harder time waking than normal. You feel weak or dizzy. You feel very thirsty. Get help right away if: You have any symptoms of very bad dehydration. You vomit every time you eat or drink. Your vomiting gets worse, does not go away, or you vomit blood or green stuff. You are getting treatment, but symptoms are getting worse. You have a fever. You have a very bad headache. You have: Diarrhea that gets worse or does not go away. Blood in your poop (stool). This may cause poop to look black and tarry. No pee in 6-8 hours. Only a small amount of pee in 6-8 hours, and the pee is very dark. You have trouble breathing. These symptoms may be an emergency. Get help right away. Call 911. Do not wait to see if the symptoms will go away. Do not drive yourself to the hospital. This information is not intended to replace advice given to you by your health care provider. Make sure you discuss any questions you have with your health care provider. Document Revised: 11/29/2021 Document Reviewed: 11/29/2021 Elsevier Patient Education  2024 [employer].

## 2022-12-13 ENCOUNTER — Ambulatory Visit (INDEPENDENT_AMBULATORY_CARE_PROVIDER_SITE_OTHER): Payer: Medicare HMO | Admitting: Nurse Practitioner

## 2022-12-13 ENCOUNTER — Encounter: Payer: Self-pay | Admitting: Nurse Practitioner

## 2022-12-13 VITALS — BP 118/80 | HR 91 | Temp 98.3°F | Ht 68.82 in | Wt 198.6 lb

## 2022-12-13 DIAGNOSIS — T671XXA Heat syncope, initial encounter: Secondary | ICD-10-CM | POA: Diagnosis not present

## 2022-12-13 DIAGNOSIS — R7309 Other abnormal glucose: Secondary | ICD-10-CM | POA: Diagnosis not present

## 2022-12-13 DIAGNOSIS — E119 Type 2 diabetes mellitus without complications: Secondary | ICD-10-CM | POA: Insufficient documentation

## 2022-12-13 NOTE — Assessment & Plan Note (Signed)
Acute episode on 12/08/22 due to dehydration -- no further episodes since and is working on more water intake.  Discussed with him importance of good hydration when outside for longer periods in the heat.  Recommend he not be out for long periods when temperatures are >90.  No injuries present.  Neuro exam reassuring today.  Monitor closely.  Labs: CBC, CMP.

## 2022-12-13 NOTE — Assessment & Plan Note (Signed)
Noted on ER labs recently, but had just ate prior to ER.  Will check A1c today.  Discussed with patient.  Cut back on [facility].

## 2022-12-13 NOTE — Progress Notes (Signed)
BP 118/80   Pulse 91   Temp 98.3 F (36.8 C) (Oral)   Ht 5' 8.82" (1.748 m)   Wt 198 lb 9.6 oz (90.1 kg)   SpO2 94%   BMI 29.48 kg/m    Subjective:    Patient ID: Brandon Hart, male    DOB: 1945/12/27, 77 y.o.   MRN: [id number]  HPI: Brandon Hart is a 77 y.o. male  Chief Complaint  Patient presents with   ER follow up    Patient seen in ED on 7/21 for Syncope/collapse and Dehydration. Patient states that he is feeling much better since being in ER   ER FOLLOW UP Seen in ER on 12/04/22 after a syncopal episode took place, was dehydrated - had been outside at daughter's home for 2 hours and had not drank any water while outside.  Was a hot day -- high 90's.  No alcohol use.  He was outside and felt it come on, had just got through eating -- felt light-headed, sat down and head went back (passed out).  Had two paramedics there, son-in-law and grandson, who checked his BP which was hypotensive in the 70's.  Prefers drinking [employer].  BS in ER was 161 and 168, he denies any [facility] prior to ER but had ate.  Kidney function was slightly down from baseline.  Since that time has had no further episodes of syncope.  Staying well hydrated at home. Time since discharge: 9 days Hospital/facility: [facility] Diagnosis: Syncope and collapse + Dehydration Procedures/tests: Blood work Consultants: none New medications: none Discharge instructions:  Follow-up with PCP Status: better   Relevant past medical, surgical, family and social history reviewed and updated as indicated. Interim medical history since our last visit reviewed. Allergies and medications reviewed and updated.  Review of Systems  Constitutional:  Negative for activity change, diaphoresis, fatigue and fever.  Respiratory:  Negative for cough, chest tightness, shortness of breath and wheezing.   Cardiovascular:  Negative for chest pain, palpitations and leg swelling.  Gastrointestinal: Negative.    Neurological: Negative.   Psychiatric/Behavioral: Negative.      Per HPI unless specifically indicated above     Objective:    BP 118/80   Pulse 91   Temp 98.3 F (36.8 C) (Oral)   Ht 5' 8.82" (1.748 m)   Wt 198 lb 9.6 oz (90.1 kg)   SpO2 94%   BMI 29.48 kg/m   Wt Readings from Last 3 Encounters:  12/13/22 198 lb 9.6 oz (90.1 kg)  12/04/22 190 lb (86.2 kg)  09/01/22 193 lb (87.5 kg)    Physical Exam Vitals and nursing note reviewed.  Constitutional:      General: He is awake. He is not in acute distress.    Appearance: He is well-developed and well-groomed. He is not ill-appearing or toxic-appearing.  HENT:     Head: Normocephalic and atraumatic.     Right Ear: Hearing normal. No drainage.     Left Ear: Hearing normal. No drainage.  Eyes:     General: Lids are normal.        Right eye: No discharge.        Left eye: No discharge.     Extraocular Movements: Extraocular movements intact.     Conjunctiva/sclera: Conjunctivae normal.     Pupils: Pupils are equal, round, and reactive to light.     Visual Fields: Right eye visual fields normal and left eye visual fields normal.  Neck:  Thyroid: No thyromegaly.     Vascular: No carotid bruit.  Cardiovascular:     Rate and Rhythm: Normal rate and regular rhythm.     Heart sounds: Normal heart sounds, S1 normal and S2 normal. No murmur heard.    No gallop.  Pulmonary:     Effort: Pulmonary effort is normal. No accessory muscle usage or respiratory distress.     Breath sounds: Normal breath sounds. No decreased breath sounds, wheezing, rhonchi or rales.     Comments: Lungs overall clear on exam today. Abdominal:     General: Bowel sounds are normal.     Palpations: Abdomen is soft.  Musculoskeletal:     Right hand: Normal pulse.     Cervical back: Normal range of motion and neck supple.     Right lower leg: No edema.     Left lower leg: No edema.  Lymphadenopathy:     Cervical: No cervical adenopathy.  Skin:     General: Skin is warm and dry.     Capillary Refill: Capillary refill takes less than 2 seconds.  Neurological:     Mental Status: He is alert and oriented to person, place, and time.     Cranial Nerves: Cranial nerves 2-12 are intact.     Motor: Motor function is intact.     Coordination: Coordination is intact.     Deep Tendon Reflexes: Reflexes are normal and symmetric.     Reflex Scores:      Brachioradialis reflexes are 2+ on the right side and 2+ on the left side.      Patellar reflexes are 2+ on the right side and 2+ on the left side. Psychiatric:        Attention and Perception: Attention normal.        Mood and Affect: Mood normal.        Speech: Speech normal.        Behavior: Behavior normal. Behavior is cooperative.        Thought Content: Thought content normal.    Results for orders placed or performed during the hospital encounter of 12/04/22  Comprehensive metabolic panel  Result Value Ref Range   Sodium 137 135 - 145 mmol/L   Potassium 3.6 3.5 - 5.1 mmol/L   Chloride 101 98 - 111 mmol/L   CO2 29 22 - 32 mmol/L   Glucose, Bld 168 (H) 70 - 99 mg/dL   BUN 26 (H) 8 - 23 mg/dL   Creatinine, Ser 7.82 (H) 0.61 - 1.24 mg/dL   Calcium 9.1 8.9 - 95.6 mg/dL   Total Protein 7.4 6.5 - 8.1 g/dL   Albumin 4.2 3.5 - 5.0 g/dL   AST 19 15 - 41 U/L   ALT 20 0 - 44 U/L   Alkaline Phosphatase 86 38 - 126 U/L   Total Bilirubin 0.7 0.3 - 1.2 mg/dL   GFR, Estimated 49 (L) >60 mL/min   Anion gap 7 5 - 15  CBC  Result Value Ref Range   WBC 12.0 (H) 4.0 - 10.5 K/uL   RBC 4.58 4.22 - 5.81 MIL/uL   Hemoglobin 13.7 13.0 - 17.0 g/dL   HCT 21.3 08.6 - 57.8 %   MCV 87.6 80.0 - 100.0 fL   MCH 29.9 26.0 - 34.0 pg   MCHC 34.2 30.0 - 36.0 g/dL   RDW 46.9 62.9 - 52.8 %   Platelets 194 150 - 400 K/uL   nRBC 0.0 0.0 - 0.2 %  CBG monitoring,  ED  Result Value Ref Range   Glucose-Capillary 161 (H) 70 - 99 mg/dL   Comment 1 Notify RN    Comment 2 Document in Chart       Assessment &  Plan:   Problem List Items Addressed This Visit       Other   Elevated glucose level - Primary    Noted on ER labs recently, but had just ate prior to ER.  Will check A1c today.  Discussed with patient.  Cut back on [facility].      Relevant Orders   HgB A1c   Heat syncope    Acute episode on 12/08/22 due to dehydration -- no further episodes since and is working on more water intake.  Discussed with him importance of good hydration when outside for longer periods in the heat.  Recommend he not be out for long periods when temperatures are >90.  No injuries present.  Neuro exam reassuring today.  Monitor closely.  Labs: CBC, CMP.      Relevant Orders   Comprehensive metabolic panel   CBC with Differential/Platelet     Follow up plan: Return for as scheduled on September 18th.

## 2022-12-14 DIAGNOSIS — I10 Essential (primary) hypertension: Secondary | ICD-10-CM | POA: Diagnosis not present

## 2022-12-14 DIAGNOSIS — J449 Chronic obstructive pulmonary disease, unspecified: Secondary | ICD-10-CM | POA: Diagnosis not present

## 2022-12-14 DIAGNOSIS — F17210 Nicotine dependence, cigarettes, uncomplicated: Secondary | ICD-10-CM | POA: Diagnosis not present

## 2022-12-14 NOTE — Progress Notes (Signed)
Good afternoon, please let Brandon Hart know his labs have returned: - He continues to show some mild decreased kidney function, I recommend you ensure to get plenty of fluids during day -- especially water.  This is important for kidney health.  Liver function is normal. - CBC shows no anemia or infection. - Your diabetes testing does show A1c of 6.6%, any A1c 6.5% or greater is considered diabetes.  For now focus on diet, reducing sweets and foods high in carbohydrates like white bread/pasta/white rice/potatoes and eat more vegetables, chicken, and fish.  We will discuss further in September at your visit.  Any questions? Keep being amazing!!  Thank you for allowing me to participate in your care.  I appreciate you. Kindest regards, Clare Fennimore

## 2023-01-26 ENCOUNTER — Other Ambulatory Visit: Payer: Medicare HMO

## 2023-01-26 ENCOUNTER — Other Ambulatory Visit: Payer: Self-pay

## 2023-01-26 ENCOUNTER — Telehealth: Payer: Medicare HMO

## 2023-01-26 NOTE — Patient Instructions (Signed)
Visit Information  Thank you for taking time to visit with me today. Please don't hesitate to contact me if I can be of assistance to you before our next scheduled telephone appointment.  Following are the goals we discussed today:   Goals Addressed             This Visit's Progress    RNCM Care Management  Expected Outcome:  Monitor, Self-Manage, and Reduce Symptoms of Hypertension       Current Barriers:  Knowledge Deficits related to abnormal labs and how to manage effectively with HTN and other chronic conditions associated with HLD and heart health Chronic Disease Management support and education needs related to effective management of HTN BP Readings from Last 3 Encounters:  12/13/22 118/80  12/04/22 131/82  09/01/22 120/78     Planned Interventions: Evaluation of current treatment plan related to hypertension self management and patient's adherence to plan as established by provider. The patients blood pressures are stable. The patient has not had any new low blood pressures. Is staying hydrated and denies any acute changes in his blood pressures or heart health.  Provided education to patient re: stroke prevention, s/s of heart attack and stroke. Education on how controlling blood pressures and cholesterol levels reduce the risk of heart attack and stroke. ; Reviewed prescribed diet heart healthy diet, the patient states he does not add salt to his food but he really eats what he wants to eat. Denies any issues with swelling in his feet and legs.  Reviewed medications with patient and discussed importance of compliance. The patient is compliant with medications. The patient states compliance with medications. Denies any acute findings or changes with medications. Could not tolerate the Wellbutrin for  helping with smoking cessation.  Discussed plans with patient for ongoing care management follow up and provided patient with direct contact information for care management  team; Advised patient, providing education and rationale, to monitor blood pressure daily and record, calling PCP for findings outside established parameters;  Reviewed scheduled/upcoming provider appointments including: with the pcp 02-01-2023 at 0940 am Advised patient to discuss changes in his blood pressures, changes in his lab values for cholesterol, questions or concerns with provider; Provided education on prescribed diet heart healthy diet, monitoring for starchy foods that can increase triglyceride levels, staying away from fried foods. Received information in mail on heart healthy diet. Education on drinking more water in his diet. He likes to drink sweet tea, milk, and Mt. Dew. Review of monitoring for swelling in his feet and legs. Will continue to monitor for changes in dietary habits. Discussed complications of poorly controlled blood pressure such as heart disease, stroke, circulatory complications, vision complications, kidney impairment, sexual dysfunction;  Screening for signs and symptoms of depression related to chronic disease state;  Assessed social determinant of health barriers;  Smoker >50 years. Was taking medications but this was causing insomnia. The patient states he has tried to cut back. Is working with the pcp. Will send information in mail for help with smoking cessation  Symptom Management: Take medications as prescribed   Attend all scheduled provider appointments Call provider office for new concerns or questions  call the Suicide and Crisis Lifeline: 988 call the [place] National Suicide Prevention Lifeline: 9844978526 or TTY: 585-238-4072 TTY 737-010-3180) to talk to a trained counselor call 1-800-273-TALK (toll free, 24 hour hotline) if experiencing a Mental Health or Behavioral Health Crisis  learn about high blood pressure develop an action plan for high blood  pressure keep all doctor appointments take medications for blood pressure exactly as  prescribed report new symptoms to your doctor  Follow Up Plan: Telephone follow up appointment with care management team member scheduled for: 03-23-2023 at 0945 am       RNCM Care Management:   Maintain, Monitor and Self-Manage Symptoms of COPD       Current Barriers:  Care Coordination needs related to inhaler cost and needs from pharm D assistance with obtaining inhaler in a patient with COPD Chronic Disease Management support and education needs related to effective management of COPD Current every day smoker. Working with pcp on smoking cessation  Planned Interventions: Provided patient with basic written and verbal COPD education on self care/management/and exacerbation prevention. The patient is doing well and denies any new concerns with his breathing or COPD. He has not had any further episodes with dehydration. The patient will see the pcp next week for follow up.  Advised patient to track and manage COPD triggers. Review of triggers. The patient denies any acute changes in his environment causing problems for his breathing.  Provided written and verbal instructions on pursed lip breathing and utilized returned demonstration as teach back Provided instruction about proper use of medications used for management of COPD including inhalers. The patient has received his Anoro inhaler and has worked with the pharm D on cost constraints. The patient was able to get his inhaler at $0 co-pay. The patient was thankful for this. Denies any questions related to inhaler use. States he is using his inhalers without any issues. Education and support provided.  Advised patient to self assesses COPD action plan zone and make appointment with provider if in the yellow zone for 48 hours without improvement Advised patient to engage in light exercise as tolerated 3-5 days a week to aid in the the management of COPD Provided education about and advised patient to utilize infection prevention strategies to  reduce risk of respiratory infection Discussed the importance of adequate rest and management of fatigue with COPD Screening for signs and symptoms of depression related to chronic disease state  Assessed social determinant of health barriers Will send the patient smoking cessation material in the mail to assist with education and help with smoking cessation. The patient is actively working with the pcp on smoking cessation  Symptom Management: Take medications as prescribed   Attend all scheduled provider appointments Call provider office for new concerns or questions  call the Suicide and Crisis Lifeline: 988 call the [place] National Suicide Prevention Lifeline: (470)323-9501 or TTY: (907)868-5524 TTY (765) 441-7225) to talk to a trained counselor call 1-800-273-TALK (toll free, 24 hour hotline) if experiencing a Mental Health or Behavioral Health Crisis  avoid second hand smoke eliminate smoking in my home identify and remove indoor air pollutants limit outdoor activity during cold weather listen for public air quality announcements every day do breathing exercises every day develop a rescue plan eliminate symptom triggers at home follow rescue plan if symptoms flare-up  Follow Up Plan: Telephone follow up appointment with care management team member scheduled for: 03-23-2023 at 0945 am           Our next appointment is by telephone on 03-23-2023 at 0945 am  Please call the care guide team at [phone] if you need to cancel or reschedule your appointment.   If you are experiencing a Mental Health or Behavioral Health Crisis or need someone to talk to, please call the Suicide and Crisis Lifeline: 988 call the  [place] National Suicide Prevention Lifeline: 818 330 9288 or TTY: (519) 506-3489 TTY 541-071-5884) to talk to a trained counselor call 1-800-273-TALK (toll free, 24 hour hotline)   The patient verbalized understanding of instructions, educational materials, and care plan  provided today and DECLINED offer to receive copy of patient instructions, educational materials, and care plan.     Alto Denver RN, MSN, CCM RN Care Manager  [facility]  Ambulatory Care Management  Direct Number: [phone]

## 2023-01-26 NOTE — Patient Outreach (Signed)
Care Management   Visit Note  01/26/2023 Name: Brandon Hart MRN: [id number] DOB: 06/08/1945  Subjective: Brandon Hart is a 77 y.o. year old male who is a primary care patient of [name], Dorie Rank, NP. The Care Management team was consulted for assistance.      Engaged with patient spoke with patient by telephone.    Goals Addressed             This Visit's Progress    RNCM Care Management  Expected Outcome:  Monitor, Self-Manage, and Reduce Symptoms of Hypertension       Current Barriers:  Knowledge Deficits related to abnormal labs and how to manage effectively with HTN and other chronic conditions associated with HLD and heart health Chronic Disease Management support and education needs related to effective management of HTN BP Readings from Last 3 Encounters:  12/13/22 118/80  12/04/22 131/82  09/01/22 120/78     Planned Interventions: Evaluation of current treatment plan related to hypertension self management and patient's adherence to plan as established by provider. The patients blood pressures are stable. The patient has not had any new low blood pressures. Is staying hydrated and denies any acute changes in his blood pressures or heart health.  Provided education to patient re: stroke prevention, s/s of heart attack and stroke. Education on how controlling blood pressures and cholesterol levels reduce the risk of heart attack and stroke. ; Reviewed prescribed diet heart healthy diet, the patient states he does not add salt to his food but he really eats what he wants to eat. Denies any issues with swelling in his feet and legs.  Reviewed medications with patient and discussed importance of compliance. The patient is compliant with medications. The patient states compliance with medications. Denies any acute findings or changes with medications. Could not tolerate the Wellbutrin for  helping with smoking cessation.  Discussed plans with patient for  ongoing care management follow up and provided patient with direct contact information for care management team; Advised patient, providing education and rationale, to monitor blood pressure daily and record, calling PCP for findings outside established parameters;  Reviewed scheduled/upcoming provider appointments including: with the pcp 02-01-2023 at 0940 am Advised patient to discuss changes in his blood pressures, changes in his lab values for cholesterol, questions or concerns with provider; Provided education on prescribed diet heart healthy diet, monitoring for starchy foods that can increase triglyceride levels, staying away from fried foods. Received information in mail on heart healthy diet. Education on drinking more water in his diet. He likes to drink sweet tea, milk, and Mt. Dew. Review of monitoring for swelling in his feet and legs. Will continue to monitor for changes in dietary habits. Discussed complications of poorly controlled blood pressure such as heart disease, stroke, circulatory complications, vision complications, kidney impairment, sexual dysfunction;  Screening for signs and symptoms of depression related to chronic disease state;  Assessed social determinant of health barriers;  Smoker >50 years. Was taking medications but this was causing insomnia. The patient states he has tried to cut back. Is working with the pcp. Will send information in mail for help with smoking cessation  Symptom Management: Take medications as prescribed   Attend all scheduled provider appointments Call provider office for new concerns or questions  call the Suicide and Crisis Lifeline: 988 call the [place] National Suicide Prevention Lifeline: (802)524-6173 or TTY: (334) 576-4496 TTY (570)491-5271) to talk to a trained counselor call 1-800-273-TALK (toll free, 24 hour hotline) if experiencing  a Mental Health or Behavioral Health Crisis  learn about high blood pressure develop an action plan for  high blood pressure keep all doctor appointments take medications for blood pressure exactly as prescribed report new symptoms to your doctor  Follow Up Plan: Telephone follow up appointment with care management team member scheduled for: 03-23-2023 at 0945 am       RNCM Care Management:   Maintain, Monitor and Self-Manage Symptoms of COPD       Current Barriers:  Care Coordination needs related to inhaler cost and needs from pharm D assistance with obtaining inhaler in a patient with COPD Chronic Disease Management support and education needs related to effective management of COPD Current every day smoker. Working with pcp on smoking cessation  Planned Interventions: Provided patient with basic written and verbal COPD education on self care/management/and exacerbation prevention. The patient is doing well and denies any new concerns with his breathing or COPD. He has not had any further episodes with dehydration. The patient will see the pcp next week for follow up.  Advised patient to track and manage COPD triggers. Review of triggers. The patient denies any acute changes in his environment causing problems for his breathing.  Provided written and verbal instructions on pursed lip breathing and utilized returned demonstration as teach back Provided instruction about proper use of medications used for management of COPD including inhalers. The patient has received his Anoro inhaler and has worked with the pharm D on cost constraints. The patient was able to get his inhaler at $0 co-pay. The patient was thankful for this. Denies any questions related to inhaler use. States he is using his inhalers without any issues. Education and support provided.  Advised patient to self assesses COPD action plan zone and make appointment with provider if in the yellow zone for 48 hours without improvement Advised patient to engage in light exercise as tolerated 3-5 days a week to aid in the the management of  COPD Provided education about and advised patient to utilize infection prevention strategies to reduce risk of respiratory infection Discussed the importance of adequate rest and management of fatigue with COPD Screening for signs and symptoms of depression related to chronic disease state  Assessed social determinant of health barriers Will send the patient smoking cessation material in the mail to assist with education and help with smoking cessation. The patient is actively working with the pcp on smoking cessation  Symptom Management: Take medications as prescribed   Attend all scheduled provider appointments Call provider office for new concerns or questions  call the Suicide and Crisis Lifeline: 988 call the [place] National Suicide Prevention Lifeline: (708)545-7873 or TTY: 915-846-2110 TTY 405-254-5554) to talk to a trained counselor call 1-800-273-TALK (toll free, 24 hour hotline) if experiencing a Mental Health or Behavioral Health Crisis  avoid second hand smoke eliminate smoking in my home identify and remove indoor air pollutants limit outdoor activity during cold weather listen for public air quality announcements every day do breathing exercises every day develop a rescue plan eliminate symptom triggers at home follow rescue plan if symptoms flare-up  Follow Up Plan: Telephone follow up appointment with care management team member scheduled for: 03-23-2023 at 0945 am           Consent to Services:  Patient was given information about care management services, agreed to services, and gave verbal consent to participate.   Plan: Telephone follow up appointment with care management team member scheduled for: 03-23-2023 at 0945  am  Alto Denver RN, MSN, CCM RN Care Manager  [facility]  Ambulatory Care Management  Direct Number: [phone]

## 2023-02-01 ENCOUNTER — Ambulatory Visit: Payer: Medicare HMO | Admitting: Nurse Practitioner

## 2023-02-01 DIAGNOSIS — I700 Atherosclerosis of aorta: Secondary | ICD-10-CM

## 2023-02-01 DIAGNOSIS — I25118 Atherosclerotic heart disease of native coronary artery with other forms of angina pectoris: Secondary | ICD-10-CM

## 2023-02-01 DIAGNOSIS — E782 Mixed hyperlipidemia: Secondary | ICD-10-CM

## 2023-02-01 DIAGNOSIS — J432 Centrilobular emphysema: Secondary | ICD-10-CM

## 2023-02-01 DIAGNOSIS — I10 Essential (primary) hypertension: Secondary | ICD-10-CM

## 2023-02-01 DIAGNOSIS — Z2821 Immunization not carried out because of patient refusal: Secondary | ICD-10-CM

## 2023-02-01 DIAGNOSIS — F17210 Nicotine dependence, cigarettes, uncomplicated: Secondary | ICD-10-CM

## 2023-02-01 DIAGNOSIS — R972 Elevated prostate specific antigen [PSA]: Secondary | ICD-10-CM

## 2023-02-01 DIAGNOSIS — R7309 Other abnormal glucose: Secondary | ICD-10-CM

## 2023-02-04 NOTE — Patient Instructions (Signed)
Eating Plan for Chronic Obstructive Pulmonary Disease Chronic obstructive pulmonary disease (COPD) causes symptoms such as shortness of breath, coughing, and chest discomfort. These symptoms can make it difficult to eat enough to maintain a healthy weight. Generally, people with COPD should eat a diet that is high in calories, protein, and other nutrients to maintain body weight and to keep the lungs as healthy as possible. Depending on the medicines you take and other health conditions you may have, your health care provider may give you additional recommendations on what to eat or avoid. Talk with your health care provider about your goals for body weight, and work with a dietitian to develop an eating plan that is right for you. What are tips for following this plan? Reading food labels  Avoid foods with more than 300 milligrams (mg) of salt (sodium) per serving. Choose foods that contain at least 4 grams (g) of fiber per serving. Try to eat 20-30 g of fiber each day. Choose foods that are high in calories and protein, such as nuts, beans, yogurt, and cheese. Shopping Do not buy foods labeled as diet, low-calorie, or low-fat. If you are able to eat dairy products: Avoid low-fat or skim milk. Buy dairy products that have at least 2% fat. Buy nutritional supplement drinks. Buy grains and prepared foods labeled as enriched or fortified. Consider buying low-sodium, pre-made foods to conserve energy for eating. Cooking Add dry milk or protein powder to smoothies. Cook with healthy fats, such as olive oil, canola oil, sunflower oil, and grapeseed oil. Add oil, butter, cream cheese, or nut butters to foods to increase fat and calories. To make foods easier to chew and swallow: Cook vegetables, pasta, and rice until soft. Cut or grind meat into very small pieces. Dip breads in liquid. Meal planning  Eat when you feel hungry. Eat 5-6 small meals throughout the day. Drink 6-8 glasses of water  each day. Do not drink liquids with meals. Drink liquids at the end of the meal to avoid feeling full too quickly. Eat a variety of fruits and vegetables every day. Ask for assistance from family or friends with planning and preparing meals as needed. Avoid foods that cause you to feel bloated, such as carbonated drinks, fried foods, beans, broccoli, cabbage, and apples. For older adults, ask your local agency on aging whether you are eligible for meal assistance programs, such as Meals on Wheels. Lifestyle  Do not smoke. Eat slowly. Take small bites and chew food well before swallowing. Do not overeat. This may make it more difficult to breathe after eating. Sit up while eating. If needed, continue to use supplemental oxygen while eating. Rest or relax for 30 minutes before and after eating. Monitor your weight as told by your health care provider. Exercise as told by your health care provider. What foods should I eat? Fruits All fresh, dried, canned, or frozen fruits that do not cause gas. Vegetables All fresh, canned (no salt added), or frozen vegetables that do not cause gas. Grains Whole-grain bread. Enriched whole-grain pasta. Fortified whole-grain cereals. Fortified rice. Quinoa. Meats and other proteins Lean meat. Poultry. Fish. Dried beans. Unsalted nuts. Tofu. Eggs. Nut butters. Dairy Whole or 2% milk. Cheese. Yogurt. Fats and oils Olive oil. Canola oil. Butter. Margarine. Beverages Water. Vegetable juice (no salt added). Decaffeinated coffee. Decaffeinated or herbal tea. Seasonings and condiments Fresh or dried herbs. Low-salt or salt-free seasonings. Low-sodium soy sauce. The items listed above may not be a complete list of foods   and beverages you can eat. Contact a dietitian for more information. What foods should I avoid? Fruits Fruits that cause gas, such as apples or melon. Vegetables Vegetables that cause gas, such as broccoli, Brussels sprouts, cabbage,  cauliflower, and onions. Canned vegetables with added salt. Meats and other proteins Fried meat. Salt-cured meat. Processed meat. Dairy Fat-free or low-fat milk, yogurt, or cheese. Processed cheese. Beverages Carbonated drinks. Caffeinated drinks, such as coffee, tea, and soft drinks. Juice. Alcohol. Vegetable juice with added salt. Seasonings and condiments Salt. Seasoning mixes with salt. Soy sauce. Pickles. Other foods Clear soup or broth. Fried foods. Prepared frozen meals. The items listed above may not be a complete list of foods and beverages you should avoid. Contact a dietitian for more information. Summary COPD symptoms can make it difficult to eat enough to maintain a healthy weight. A COPD eating plan can help you maintain your body weight and keep your lungs as healthy as possible. Eat a diet that is high in calories, protein, and other nutrients. Read labels to make sure that you are getting the right nutrients. Cook foods to make them easier to chew and swallow. Eat 5-6 small meals throughout the day, and avoid foods that cause gas or make you feel bloated. This information is not intended to replace advice given to you by your health care provider. Make sure you discuss any questions you have with your health care provider. Document Revised: 03/10/2020 Document Reviewed: 03/10/2020 Elsevier Patient Education  2024 [employer]  

## 2023-02-07 ENCOUNTER — Encounter: Payer: Self-pay | Admitting: Nurse Practitioner

## 2023-02-07 ENCOUNTER — Ambulatory Visit (INDEPENDENT_AMBULATORY_CARE_PROVIDER_SITE_OTHER): Payer: Medicare HMO | Admitting: Nurse Practitioner

## 2023-02-07 VITALS — BP 113/69 | HR 76 | Temp 97.7°F | Ht 68.7 in | Wt 197.6 lb

## 2023-02-07 DIAGNOSIS — I700 Atherosclerosis of aorta: Secondary | ICD-10-CM | POA: Diagnosis not present

## 2023-02-07 DIAGNOSIS — R7309 Other abnormal glucose: Secondary | ICD-10-CM

## 2023-02-07 DIAGNOSIS — I25118 Atherosclerotic heart disease of native coronary artery with other forms of angina pectoris: Secondary | ICD-10-CM | POA: Diagnosis not present

## 2023-02-07 DIAGNOSIS — F17210 Nicotine dependence, cigarettes, uncomplicated: Secondary | ICD-10-CM | POA: Diagnosis not present

## 2023-02-07 DIAGNOSIS — R972 Elevated prostate specific antigen [PSA]: Secondary | ICD-10-CM

## 2023-02-07 DIAGNOSIS — J432 Centrilobular emphysema: Secondary | ICD-10-CM | POA: Diagnosis not present

## 2023-02-07 DIAGNOSIS — E782 Mixed hyperlipidemia: Secondary | ICD-10-CM | POA: Diagnosis not present

## 2023-02-07 DIAGNOSIS — I10 Essential (primary) hypertension: Secondary | ICD-10-CM

## 2023-02-07 LAB — MICROALBUMIN, URINE WAIVED
Creatinine, Urine Waived: 200 mg/dL (ref 10–300)
Microalb, Ur Waived: 30 mg/L — ABNORMAL HIGH (ref 0–19)
Microalb/Creat Ratio: <30 mg/g (ref <30)

## 2023-02-07 NOTE — Assessment & Plan Note (Signed)
Did not tolerate Wellbutrin.  I have recommended complete cessation of tobacco use. I have discussed various options available for assistance with tobacco cessation including over the counter methods (Nicotine gum, patch and lozenges). We also discussed prescription options (Chantix, Nicotine Inhaler / Nasal Spray). The patient is not interested in pursuing any prescription tobacco cessation options at this time. Continue annual screening until age 75.

## 2023-02-07 NOTE — Assessment & Plan Note (Signed)
Recent level was 6.6%, he wanted to focus on diet and exercise -- discussed with him today if remains elevated we will need to add diabetes diagnosis and start medication.  He reports understanding of this.  If need to start medication will see back sooner in office.  A1c today.

## 2023-02-07 NOTE — Assessment & Plan Note (Signed)
6.3 in January 2024 with infection at time, continues to be elevated - recent 7.2.  He denies any symptoms.  He continues to decline prostate exam, reports he would prefer male to check. He did not attend urology as recommended in April.  Will recheck PSA, plan on placing another urology referral if ongoing elevations above goal for age and place for male provider.

## 2023-02-07 NOTE — Assessment & Plan Note (Signed)
Chronic and stable with no recent CP.  Continue current medications and collaboration with cardiology as needed.

## 2023-02-07 NOTE — Assessment & Plan Note (Addendum)
Chronic, stable.  BP well below goal today.  Recommend he start checking BP at home three mornings a week and documenting for provider.  Discussed goal BP with him.  Continue current medication regimen and adjust as needed.  LABS: CMP and urine ALB. Recommend complete cessation of smoking.  Refills up to date.  Return in 6 months.

## 2023-02-07 NOTE — Assessment & Plan Note (Signed)
Chronic, ongoing.  Continue current medication regimen and adjust as needed.  Lipid panel today.  Return in 6 months. 

## 2023-02-07 NOTE — Assessment & Plan Note (Signed)
Chronic.  Noted on lung CA CT screening, continue statin daily and recommend daily Baby ASA.  Recommend complete cessation smoking for prevention.

## 2023-02-07 NOTE — Assessment & Plan Note (Signed)
Chronic, ongoing.  Continue to recommend complete smoking cessation.  At this time continue Anoro daily as is offering benefit and lungs clearer on exam.  March 2024 FEV1 59% and FEV1/FVC 97%.  Educated him at length on emphysema and progressive nature of disease.   Continue annual lung screening.  Spirometry up to date, due next March 2025.

## 2023-02-07 NOTE — Progress Notes (Signed)
BP 113/69   Pulse 76   Temp 97.7 F (36.5 C) (Oral)   Ht 5' 8.7" (1.745 m)   Wt 197 lb 9.6 oz (89.6 kg)   SpO2 97%   BMI 29.44 kg/m    Subjective:    Patient ID: Brandon Hart, male    DOB: Aug 06, 1945, 77 y.o.   MRN: [id number]  HPI: Brandon Hart is a 77 y.o. male  Chief Complaint  Patient presents with   COPD   Hyperlipidemia   Hypertension   HYPERTENSION / HYPERLIPIDEMIA Taking Lipitor and Norvasc + Lisinopril-HCTZ.   Satisfied with current treatment? yes Duration of hypertension: chronic BP monitoring frequency: not checking BP range: not checking BP medication side effects: no Duration of hyperlipidemia: chronic Cholesterol medication side effects: no Cholesterol supplements: none Medication compliance: good compliance Aspirin: no Recent stressors: no Recurrent headaches: no Visual changes: no Palpitations: no Dyspnea: no Chest pain: no Lower extremity edema: no Dizzy/lightheaded: no The 10-year ASCVD risk score (Arnett DK, et al., 2019) is: 26.1%   Values used to calculate the score:     Age: 48 years     Sex: Male     Is Non-Hispanic African American: No     Diabetic: No     Tobacco smoker: Yes     Systolic Blood Pressure: 113 mmHg     Is BP treated: Yes     HDL Cholesterol: 31 mg/dL     Total Cholesterol: 138 mg/dL    Impaired Fasting Glucose HbA1C:  Lab Results  Component Value Date   HGBA1C 6.6 (H) 12/13/2022  Polydipsia: no Polyuria: no Weight change: no Visual disturbance: no Glucose Monitoring: no    Accucheck frequency: Not Checking    Fasting glucose:     Post prandial:  Diabetic Education: Not Completed Family history of diabetes: yes - brother has it   COPD Smokes 1/2 pack per day, used to smoke 1 PPD.  He is interested in quitting and wishes to think about medications, we discussed options today. Recent lung cancer screening done 07/22/22 -- continues to note centrilobular and paraseptal emphysema + aortic  atherosclerosis.    Is using Anoro every morning, which he reports helps.  Has never had to use Albuterol. COPD status: stable Satisfied with current treatment?: yes Oxygen use: no Dyspnea frequency: none Cough frequency: occasional with smoking Rescue inhaler frequency:  none Limitation of activity: no Productive cough: none Last Spirometry: FEV1/FVC 97% on 08/20/22 and previous FEV1 79% and FEV1/FVC 98% Pneumovax: Not up to Date -- refuses Influenza: Not Up to Date -- refuses  ELEVATED PSA In April PSA was 7.2 trending back down.  He refuses prostate exam at time. A referral was placed to urology 08/20/22, but he has not attended. No family history of prostate cancer. Duration: months Nocturia: 1/night Urinary frequency:no Incomplete voiding: no Urgency: no Weak urinary stream: no Straining to start stream: no Dysuria: no  Relevant past medical, surgical, family and social history reviewed and updated as indicated. Interim medical history since our last visit reviewed. Allergies and medications reviewed and updated.  Review of Systems  Constitutional:  Negative for activity change, diaphoresis, fatigue and fever.  Respiratory:  Negative for cough, chest tightness, shortness of breath and wheezing.   Cardiovascular:  Negative for chest pain, palpitations and leg swelling.  Gastrointestinal: Negative.   Endocrine: Negative for polydipsia, polyphagia and polyuria.  Genitourinary: Negative.   Neurological: Negative.   Psychiatric/Behavioral: Negative.     Per HPI unless  specifically indicated above     Objective:    BP 113/69   Pulse 76   Temp 97.7 F (36.5 C) (Oral)   Ht 5' 8.7" (1.745 m)   Wt 197 lb 9.6 oz (89.6 kg)   SpO2 97%   BMI 29.44 kg/m   Wt Readings from Last 3 Encounters:  02/07/23 197 lb 9.6 oz (89.6 kg)  12/13/22 198 lb 9.6 oz (90.1 kg)  12/04/22 190 lb (86.2 kg)    Physical Exam Vitals and nursing note reviewed.  Constitutional:      General: He is  awake. He is not in acute distress.    Appearance: He is well-developed and well-groomed. He is not ill-appearing or toxic-appearing.  HENT:     Head: Normocephalic and atraumatic.     Right Ear: Hearing normal. No drainage.     Left Ear: Hearing normal. No drainage.  Eyes:     General: Lids are normal.        Right eye: No discharge.        Left eye: No discharge.     Conjunctiva/sclera: Conjunctivae normal.     Pupils: Pupils are equal, round, and reactive to light.  Neck:     Thyroid: No thyromegaly.     Vascular: No carotid bruit.  Cardiovascular:     Rate and Rhythm: Normal rate and regular rhythm.     Heart sounds: Normal heart sounds, S1 normal and S2 normal. No murmur heard.    No gallop.  Pulmonary:     Effort: Pulmonary effort is normal. No accessory muscle usage or respiratory distress.     Breath sounds: Normal breath sounds. No decreased breath sounds, wheezing, rhonchi or rales.     Comments: Lungs overall clearer on exam today. Abdominal:     General: Bowel sounds are normal.     Palpations: Abdomen is soft.  Genitourinary:    Comments: He refuses prostate exam today, would prefer male to check (if needs urology will get male provider). Musculoskeletal:     Right hand: Normal pulse.     Cervical back: Normal range of motion and neck supple.     Right lower leg: No edema.     Left lower leg: No edema.  Lymphadenopathy:     Cervical: No cervical adenopathy.  Skin:    General: Skin is warm and dry.     Capillary Refill: Capillary refill takes less than 2 seconds.  Neurological:     Mental Status: He is alert and oriented to person, place, and time.     Deep Tendon Reflexes: Reflexes are normal and symmetric.  Psychiatric:        Attention and Perception: Attention normal.        Mood and Affect: Mood normal.        Speech: Speech normal.        Behavior: Behavior normal. Behavior is cooperative.        Thought Content: Thought content normal.     Results  for orders placed or performed in visit on 12/13/22  Comprehensive metabolic panel  Result Value Ref Range   Glucose 89 70 - 99 mg/dL   BUN 22 8 - 27 mg/dL   Creatinine, Ser 1.09 (H) 0.76 - 1.27 mg/dL   eGFR 53 (L) >32 TF/TDD/2.20   BUN/Creatinine Ratio 16 10 - 24   Sodium 139 134 - 144 mmol/L   Potassium 4.3 3.5 - 5.2 mmol/L   Chloride 99 96 - 106 mmol/L  CO2 23 20 - 29 mmol/L   Calcium 9.6 8.6 - 10.2 mg/dL   Total Protein 6.8 6.0 - 8.5 g/dL   Albumin 4.4 3.8 - 4.8 g/dL   Globulin, Total 2.4 1.5 - 4.5 g/dL   Bilirubin Total 0.4 0.0 - 1.2 mg/dL   Alkaline Phosphatase 115 44 - 121 IU/L   AST 15 0 - 40 IU/L   ALT 19 0 - 44 IU/L  CBC with Differential/Platelet  Result Value Ref Range   WBC 9.9 3.4 - 10.8 x10E3/uL   RBC 4.42 4.14 - 5.80 x10E6/uL   Hemoglobin 13.2 13.0 - 17.7 g/dL   Hematocrit 40.9 81.1 - 51.0 %   MCV 90 79 - 97 fL   MCH 29.9 26.6 - 33.0 pg   MCHC 33.3 31.5 - 35.7 g/dL   RDW 91.4 78.2 - 95.6 %   Platelets 195 150 - 450 x10E3/uL   Neutrophils 59 Not Estab. %   Lymphs 29 Not Estab. %   Monocytes 8 Not Estab. %   Eos 3 Not Estab. %   Basos 1 Not Estab. %   Neutrophils Absolute 5.9 1.4 - 7.0 x10E3/uL   Lymphocytes Absolute 2.9 0.7 - 3.1 x10E3/uL   Monocytes Absolute 0.8 0.1 - 0.9 x10E3/uL   EOS (ABSOLUTE) 0.3 0.0 - 0.4 x10E3/uL   Basophils Absolute 0.1 0.0 - 0.2 x10E3/uL   Immature Granulocytes 0 Not Estab. %   Immature Grans (Abs) 0.0 0.0 - 0.1 x10E3/uL  HgB A1c  Result Value Ref Range   Hgb A1c MFr Bld 6.6 (H) 4.8 - 5.6 %   Est. average glucose Bld gHb Est-mCnc 143 mg/dL      Assessment & Plan:   Problem List Items Addressed This Visit       Cardiovascular and Mediastinum   Aortic atherosclerosis (HCC)    Chronic.  Noted on lung CA CT screening, continue statin daily and recommend daily Baby ASA.  Recommend complete cessation smoking for prevention.      Coronary artery disease of native artery of native heart with stable angina pectoris (HCC)     Chronic and stable with no recent CP.  Continue current medications and collaboration with cardiology as needed.      Relevant Orders   Comprehensive metabolic panel   Lipid Panel w/o Chol/HDL Ratio   Hypertension    Chronic, stable.  BP well below goal today.  Recommend he start checking BP at home three mornings a week and documenting for provider.  Discussed goal BP with him.  Continue current medication regimen and adjust as needed.  LABS: CMP and urine ALB. Recommend complete cessation of smoking.  Refills up to date.  Return in 6 months.      Relevant Orders   Comprehensive metabolic panel     Respiratory   Centrilobular emphysema (HCC) - Primary    Chronic, ongoing.  Continue to recommend complete smoking cessation.  At this time continue Anoro daily as is offering benefit and lungs clearer on exam.  March 2024 FEV1 59% and FEV1/FVC 97%.  Educated him at length on emphysema and progressive nature of disease.   Continue annual lung screening.  Spirometry up to date, due next March 2025.        Other   Elevated hemoglobin A1c measurement    Recent level was 6.6%, he wanted to focus on diet and exercise -- discussed with him today if remains elevated we will need to add diabetes diagnosis and start medication.  He  reports understanding of this.  If need to start medication will see back sooner in office.  A1c today.      Relevant Orders   Microalbumin, Urine Waived   HgB A1c   Elevated PSA measurement    6.3 in January 2024 with infection at time, continues to be elevated - recent 7.2.  He denies any symptoms.  He continues to decline prostate exam, reports he would prefer male to check. He did not attend urology as recommended in April.  Will recheck PSA, plan on placing another urology referral if ongoing elevations above goal for age and place for male provider.      Relevant Orders   PSA, total and free   Hyperlipidemia    Chronic, ongoing.  Continue current medication  regimen and adjust as needed.  Lipid panel today.   Return in 6 months.      Relevant Orders   Comprehensive metabolic panel   Lipid Panel w/o Chol/HDL Ratio   Nicotine dependence, cigarettes, uncomplicated    Did not tolerate Wellbutrin.  I have recommended complete cessation of tobacco use. I have discussed various options available for assistance with tobacco cessation including over the counter methods (Nicotine gum, patch and lozenges). We also discussed prescription options (Chantix, Nicotine Inhaler / Nasal Spray). The patient is not interested in pursuing any prescription tobacco cessation options at this time. Continue annual screening until age 7.         Follow up plan: Return in about 6 months (around 08/07/2023) for Annual physical due after 07/21/23.

## 2023-02-08 LAB — LIPID PANEL W/O CHOL/HDL RATIO
Cholesterol, Total: 155 mg/dL (ref 100–199)
HDL: 27 mg/dL — ABNORMAL LOW (ref >39)
LDL Chol Calc (NIH): 68 mg/dL (ref 0–99)
Triglycerides: 381 mg/dL — ABNORMAL HIGH (ref 0–149)
VLDL Cholesterol Cal: 60 mg/dL — ABNORMAL HIGH (ref 5–40)

## 2023-02-08 LAB — COMPREHENSIVE METABOLIC PANEL
ALT: 19 IU/L (ref 0–44)
AST: 15 IU/L (ref 0–40)
Albumin: 4.7 g/dL (ref 3.8–4.8)
Alkaline Phosphatase: 107 IU/L (ref 44–121)
BUN/Creatinine Ratio: 17 (ref 10–24)
BUN: 24 mg/dL (ref 8–27)
Bilirubin Total: 0.3 mg/dL (ref 0.0–1.2)
CO2: 25 mmol/L (ref 20–29)
Calcium: 9.6 mg/dL (ref 8.6–10.2)
Chloride: 99 mmol/L (ref 96–106)
Creatinine, Ser: 1.38 mg/dL — ABNORMAL HIGH (ref 0.76–1.27)
Globulin, Total: 2.3 g/dL (ref 1.5–4.5)
Glucose: 95 mg/dL (ref 70–99)
Potassium: 4.2 mmol/L (ref 3.5–5.2)
Sodium: 137 mmol/L (ref 134–144)
Total Protein: 7 g/dL (ref 6.0–8.5)
eGFR: 53 mL/min/{1.73_m2} — ABNORMAL LOW (ref >59)

## 2023-02-08 LAB — PSA, TOTAL AND FREE
PSA, Free Pct: 22.6 %
PSA, Free: 1.72 ng/mL
Prostate Specific Ag, Serum: 7.6 ng/mL — ABNORMAL HIGH (ref 0.0–4.0)

## 2023-02-08 LAB — HEMOGLOBIN A1C
Est. average glucose Bld gHb Est-mCnc: 137 mg/dL
Hgb A1c MFr Bld: 6.4 % — ABNORMAL HIGH (ref 4.8–5.6)

## 2023-02-08 NOTE — Addendum Note (Signed)
Addended by: Aura Dials T on: 02/08/2023 01:07 PM   Modules accepted: Orders

## 2023-02-08 NOTE — Progress Notes (Signed)
Good afternoon, please let Brandon Hart know labs have returned: - Kidney function continues to show some mild kidney disease, please ensure you are drinking water daily and add a little lemon to this. - Cholesterol levels show stable LDL (bad cholesterol), but triglycerides remain elevated.  Continue Atorvastatin daily and we may add another medicine next visit. - A1c, diabetes testing, trended down from 6.6% to 6.4% -- back to prediabetes level but just barely.  Really focus heavily on diet changes at home. - PSA, prostate level, remains elevated.  I am going to place another referral to urology -- please answer phone and schedule with them.  This is important.  Any questions? Keep being amazing!!  Thank you for allowing me to participate in your care.  I appreciate you. Kindest regards, [name]

## 2023-02-24 ENCOUNTER — Telehealth: Payer: Self-pay | Admitting: *Deleted

## 2023-02-24 NOTE — Telephone Encounter (Signed)
Patient called to get cholesterol numbers- review that part of labs- labs numbers given and patient satisfied. Did discuss diet and activity to help possibly decrease elevated numbers.

## 2023-03-02 DIAGNOSIS — I25119 Atherosclerotic heart disease of native coronary artery with unspecified angina pectoris: Secondary | ICD-10-CM | POA: Diagnosis not present

## 2023-03-02 DIAGNOSIS — F17210 Nicotine dependence, cigarettes, uncomplicated: Secondary | ICD-10-CM | POA: Diagnosis not present

## 2023-03-02 DIAGNOSIS — I129 Hypertensive chronic kidney disease with stage 1 through stage 4 chronic kidney disease, or unspecified chronic kidney disease: Secondary | ICD-10-CM | POA: Diagnosis not present

## 2023-03-02 DIAGNOSIS — Z91199 Patient's noncompliance with other medical treatment and regimen due to unspecified reason: Secondary | ICD-10-CM | POA: Diagnosis not present

## 2023-03-02 DIAGNOSIS — J439 Emphysema, unspecified: Secondary | ICD-10-CM | POA: Diagnosis not present

## 2023-03-02 DIAGNOSIS — I700 Atherosclerosis of aorta: Secondary | ICD-10-CM | POA: Diagnosis not present

## 2023-03-02 DIAGNOSIS — Z833 Family history of diabetes mellitus: Secondary | ICD-10-CM | POA: Diagnosis not present

## 2023-03-02 DIAGNOSIS — Z809 Family history of malignant neoplasm, unspecified: Secondary | ICD-10-CM | POA: Diagnosis not present

## 2023-03-02 DIAGNOSIS — E785 Hyperlipidemia, unspecified: Secondary | ICD-10-CM | POA: Diagnosis not present

## 2023-03-02 DIAGNOSIS — N182 Chronic kidney disease, stage 2 (mild): Secondary | ICD-10-CM | POA: Diagnosis not present

## 2023-03-10 ENCOUNTER — Ambulatory Visit: Payer: Medicare HMO | Admitting: Urology

## 2023-03-16 ENCOUNTER — Ambulatory Visit: Payer: Medicare HMO | Admitting: Urology

## 2023-03-16 VITALS — BP 131/80 | HR 89 | Ht 69.0 in | Wt 198.2 lb

## 2023-03-16 DIAGNOSIS — R972 Elevated prostate specific antigen [PSA]: Secondary | ICD-10-CM

## 2023-03-16 NOTE — Patient Instructions (Signed)
Prostate MRI Prep:  1- No ejaculation 48 hours prior to exam  2- No caffeine or carbonated beverages on day of the exam  3- Eat light diet evening prior and day of exam  4- Avoid eating 4 hours prior to exam  5- Fleets enema needs to be done 4 hours prior to exam -See below. Can be purchased at the drug store.

## 2023-03-23 ENCOUNTER — Other Ambulatory Visit: Payer: Self-pay | Admitting: *Deleted

## 2023-03-23 ENCOUNTER — Other Ambulatory Visit: Payer: Self-pay

## 2023-03-23 NOTE — Patient Instructions (Signed)
Visit Information  Thank you for taking time to visit with me today. Please don't hesitate to contact me if I can be of assistance to you before our next scheduled telephone appointment.  Following are the goals we discussed today:   Goals Addressed             This Visit's Progress    RNCM Care Management  Expected Outcome:  Monitor, Self-Manage, and Reduce Symptoms of Hypertension       Current Barriers:  Knowledge Deficits related to abnormal labs and how to manage effectively with HTN and other chronic conditions associated with HLD and heart health Chronic Disease Management support and education needs related to effective management of HTN BP Readings from Last 3 Encounters:  03/16/23 131/80  02/07/23 113/69  12/13/22 118/80     Planned Interventions: Evaluation of current treatment plan related to hypertension self management and patient's adherence to plan as established by provider. The patients blood pressures are stable. The patient does not regularly check his blood pressures. RNCM spoke with patient about keeping log of blood pressures and staying hydrated for kidney function. Provided education to patient re: stroke prevention, s/s of heart attack and stroke. Education on how controlling blood pressures and cholesterol levels reduce the risk of heart attack and stroke. ; Reviewed prescribed diet heart healthy diet, the patient states he does not add salt to his food but he really eats what he wants to eat. Denies any issues with swelling in his feet and legs.  Reviewed medications with patient and discussed importance of compliance. The patient is compliant with medications. The patient states compliance with medications. Denies any acute findings or changes with medications.  Discussed plans with patient for ongoing care management follow up and provided patient with direct contact information for care management team; Advised patient, providing education and rationale, to  monitor blood pressure daily and record, calling PCP for findings outside established parameters;  Reviewed scheduled/upcoming provider appointments including:  Advised patient to discuss changes in his blood pressures, changes in his lab values for cholesterol, questions or concerns with provider; Provided education on prescribed diet heart healthy diet, monitoring for starchy foods that can increase triglyceride levels, staying away from fried foods. Received information in mail on heart healthy diet. Education on drinking more water in his diet. He likes to drink sweet tea, milk, and Mt. Dew. Review of monitoring for swelling in his feet and legs. Will continue to monitor for changes in dietary habits. Discussed complications of poorly controlled blood pressure such as heart disease, stroke, circulatory complications, vision complications, kidney impairment, sexual dysfunction;  Screening for signs and symptoms of depression related to chronic disease state;  Assessed social determinant of health barriers;  Smoker >50 years. Was taking medications but this was causing insomnia.   Symptom Management: Take medications as prescribed   Attend all scheduled provider appointments Call provider office for new concerns or questions  call the Suicide and Crisis Lifeline: 988 call the [place] National Suicide Prevention Lifeline: 604-140-5757 or TTY: 6091661130 TTY (253)006-6239) to talk to a trained counselor call 1-800-273-TALK (toll free, 24 hour hotline) if experiencing a Mental Health or Behavioral Health Crisis  learn about high blood pressure develop an action plan for high blood pressure keep all doctor appointments take medications for blood pressure exactly as prescribed report new symptoms to your doctor  Follow Up Plan: Telephone follow up appointment with care management team member scheduled for: 05-25-2023 at 9:00 am  RNCM Care Management:   Maintain, Monitor and Self-Manage  Symptoms of COPD       Current Barriers:  Care Coordination needs related to inhaler cost and needs from pharm D assistance with obtaining inhaler in a patient with COPD Chronic Disease Management support and education needs related to effective management of COPD Current every day smoker.Unable to tolerate Wellbutrin for smoking cessation.  Planned Interventions: Provided patient with basic written and verbal COPD education on self care/management/and exacerbation prevention. The patient is doing well and denies any new concerns with his breathing or COPD. States he is compliant with his inhalers. Advised patient to track and manage COPD triggers. Review of triggers. The patient denies any acute changes in his environment causing problems for his breathing.  Provided written and verbal instructions on pursed lip breathing and utilized returned demonstration as teach back Provided instruction about proper use of medications used for management of COPD including inhalers. The patient has received his Anoro inhaler and has worked with the pharm D on cost constraints. The patient was able to get his inhaler at $0 co-pay. The patient was thankful for this. Denies any questions related to inhaler use. States he is using his inhalers without any issues. Education and support provided.  Advised patient to self assesses COPD action plan zone and make appointment with provider if in the yellow zone for 48 hours without improvement Advised patient to engage in light exercise as tolerated 3-5 days a week to aid in the the management of COPD Provided education about and advised patient to utilize infection prevention strategies to reduce risk of respiratory infection Discussed the importance of adequate rest and management of fatigue with COPD Screening for signs and symptoms of depression related to chronic disease state  Assessed social determinant of health barriers   Symptom Management: Take medications  as prescribed   Attend all scheduled provider appointments Call provider office for new concerns or questions  call the Suicide and Crisis Lifeline: 988 call the [place] National Suicide Prevention Lifeline: 2092307086 or TTY: 778-465-7236 TTY (209) 185-1419) to talk to a trained counselor call 1-800-273-TALK (toll free, 24 hour hotline) if experiencing a Mental Health or Behavioral Health Crisis  avoid second hand smoke eliminate smoking in my home identify and remove indoor air pollutants limit outdoor activity during cold weather listen for public air quality announcements every day do breathing exercises every day develop a rescue plan eliminate symptom triggers at home follow rescue plan if symptoms flare-up  Follow Up Plan: Telephone follow up appointment with care management team member scheduled for: 05-25-2023 at 9:00 am           Our next appointment is by telephone on 05-25-2023 at 9:00 am  Please call the care guide team at [phone] if you need to cancel or reschedule your appointment.   If you are experiencing a Mental Health or Behavioral Health Crisis or need someone to talk to, please call the Suicide and Crisis Lifeline: 988 call the [place] National Suicide Prevention Lifeline: 639-700-6892 or TTY: 939 262 2146 TTY (262)873-4577) to talk to a trained counselor call 1-800-273-TALK (toll free, 24 hour hotline) call 911   The patient verbalized understanding of instructions, educational materials, and care plan provided today and DECLINED offer to receive copy of patient instructions, educational materials, and care plan.   Telephone follow up appointment with care management team member scheduled for: 05-25-2023 at 9:00 am  Danise Edge, BSN RN RN Care Manager  [facility]  Ambulatory Care Management  Direct Number: [phone]

## 2023-03-23 NOTE — Patient Outreach (Signed)
Care Management   Visit Note  03/23/2023 Name: Brandon Hart MRN: [id number] DOB: Aug 28, 1945  Subjective: Brandon Hart is a 77 y.o. year old male who is a primary care patient of [name], Dorie Rank, NP. The Care Management team was consulted for assistance.      Engaged with patient spoke with patient by telephone.    Goals Addressed             This Visit's Progress    RNCM Care Management  Expected Outcome:  Monitor, Self-Manage, and Reduce Symptoms of Hypertension       Current Barriers:  Knowledge Deficits related to abnormal labs and how to manage effectively with HTN and other chronic conditions associated with HLD and heart health Chronic Disease Management support and education needs related to effective management of HTN BP Readings from Last 3 Encounters:  03/16/23 131/80  02/07/23 113/69  12/13/22 118/80     Planned Interventions: Evaluation of current treatment plan related to hypertension self management and patient's adherence to plan as established by provider. The patients blood pressures are stable. The patient does not regularly check his blood pressures. RNCM spoke with patient about keeping log of blood pressures and staying hydrated for kidney function. Provided education to patient re: stroke prevention, s/s of heart attack and stroke. Education on how controlling blood pressures and cholesterol levels reduce the risk of heart attack and stroke. ; Reviewed prescribed diet heart healthy diet, the patient states he does not add salt to his food but he really eats what he wants to eat. Denies any issues with swelling in his feet and legs.  Reviewed medications with patient and discussed importance of compliance. The patient is compliant with medications. The patient states compliance with medications. Denies any acute findings or changes with medications.  Discussed plans with patient for ongoing care management follow up and provided patient  with direct contact information for care management team; Advised patient, providing education and rationale, to monitor blood pressure daily and record, calling PCP for findings outside established parameters;  Reviewed scheduled/upcoming provider appointments including:  Advised patient to discuss changes in his blood pressures, changes in his lab values for cholesterol, questions or concerns with provider; Provided education on prescribed diet heart healthy diet, monitoring for starchy foods that can increase triglyceride levels, staying away from fried foods. Received information in mail on heart healthy diet. Education on drinking more water in his diet. He likes to drink sweet tea, milk, and Mt. Dew. Review of monitoring for swelling in his feet and legs. Will continue to monitor for changes in dietary habits. Discussed complications of poorly controlled blood pressure such as heart disease, stroke, circulatory complications, vision complications, kidney impairment, sexual dysfunction;  Screening for signs and symptoms of depression related to chronic disease state;  Assessed social determinant of health barriers;  Smoker >50 years. Was taking medications but this was causing insomnia.   Symptom Management: Take medications as prescribed   Attend all scheduled provider appointments Call provider office for new concerns or questions  call the Suicide and Crisis Lifeline: 988 call the [place] National Suicide Prevention Lifeline: 762-030-5352 or TTY: 865-352-6926 TTY 249-630-4843) to talk to a trained counselor call 1-800-273-TALK (toll free, 24 hour hotline) if experiencing a Mental Health or Behavioral Health Crisis  learn about high blood pressure develop an action plan for high blood pressure keep all doctor appointments take medications for blood pressure exactly as prescribed report new symptoms to your doctor  Follow  Up Plan: Telephone follow up appointment with care management team  member scheduled for: 05-25-2023 at 9:00 am       RNCM Care Management:   Maintain, Monitor and Self-Manage Symptoms of COPD       Current Barriers:  Care Coordination needs related to inhaler cost and needs from pharm D assistance with obtaining inhaler in a patient with COPD Chronic Disease Management support and education needs related to effective management of COPD Current every day smoker.Unable to tolerate Wellbutrin for smoking cessation.  Planned Interventions: Provided patient with basic written and verbal COPD education on self care/management/and exacerbation prevention. The patient is doing well and denies any new concerns with his breathing or COPD. States he is compliant with his inhalers. Advised patient to track and manage COPD triggers. Review of triggers. The patient denies any acute changes in his environment causing problems for his breathing.  Provided written and verbal instructions on pursed lip breathing and utilized returned demonstration as teach back Provided instruction about proper use of medications used for management of COPD including inhalers. The patient has received his Anoro inhaler and has worked with the pharm D on cost constraints. The patient was able to get his inhaler at $0 co-pay. The patient was thankful for this. Denies any questions related to inhaler use. States he is using his inhalers without any issues. Education and support provided.  Advised patient to self assesses COPD action plan zone and make appointment with provider if in the yellow zone for 48 hours without improvement Advised patient to engage in light exercise as tolerated 3-5 days a week to aid in the the management of COPD Provided education about and advised patient to utilize infection prevention strategies to reduce risk of respiratory infection Discussed the importance of adequate rest and management of fatigue with COPD Screening for signs and symptoms of depression related to  chronic disease state  Assessed social determinant of health barriers   Symptom Management: Take medications as prescribed   Attend all scheduled provider appointments Call provider office for new concerns or questions  call the Suicide and Crisis Lifeline: 988 call the [place] National Suicide Prevention Lifeline: (229)043-3324 or TTY: 716-859-3269 TTY (210) 184-5346) to talk to a trained counselor call 1-800-273-TALK (toll free, 24 hour hotline) if experiencing a Mental Health or Behavioral Health Crisis  avoid second hand smoke eliminate smoking in my home identify and remove indoor air pollutants limit outdoor activity during cold weather listen for public air quality announcements every day do breathing exercises every day develop a rescue plan eliminate symptom triggers at home follow rescue plan if symptoms flare-up  Follow Up Plan: Telephone follow up appointment with care management team member scheduled for: 05-25-2023 at 9:00 am             Consent to Services:  Patient was given information about care management services, agreed to services, and gave verbal consent to participate.   Plan: Telephone follow up appointment with care management team member scheduled for: 05-25-2023 at 9:00 am  Danise Edge, BSN RN RN Care Manager  [facility]  Ambulatory Care Management  Direct Number: [phone]

## 2023-03-30 ENCOUNTER — Ambulatory Visit
Admission: RE | Admit: 2023-03-30 | Discharge: 2023-03-30 | Disposition: A | Discharge status: Home / Self Care | Payer: Medicare HMO | Source: Ambulatory Visit | Attending: Urology | Admitting: Urology

## 2023-03-30 DIAGNOSIS — R972 Elevated prostate specific antigen [PSA]: Secondary | ICD-10-CM

## 2023-03-30 DIAGNOSIS — N4289 Other specified disorders of prostate: Secondary | ICD-10-CM | POA: Diagnosis not present

## 2023-03-30 DIAGNOSIS — K5730 Diverticulosis of large intestine without perforation or abscess without bleeding: Secondary | ICD-10-CM | POA: Diagnosis not present

## 2023-03-30 MED ORDER — GADOBUTROL 1 MMOL/ML IV SOLN
9.0000 mL | Freq: Once | INTRAVENOUS | Status: AC | PRN
  Administered 2023-03-30: 9 mL via INTRAVENOUS

## 2023-04-19 ENCOUNTER — Ambulatory Visit: Payer: Medicare HMO | Admitting: Urology

## 2023-04-19 VITALS — BP 128/80 | HR 80 | Ht 69.0 in | Wt 199.5 lb

## 2023-04-19 DIAGNOSIS — R972 Elevated prostate specific antigen [PSA]: Secondary | ICD-10-CM | POA: Diagnosis not present

## 2023-04-19 NOTE — Progress Notes (Signed)
Marcelle Overlie Plume,acting as a scribe for Brandon Scotland, MD.,have documented all relevant documentation on the behalf of Brandon Scotland, MD,as directed by  Brandon Scotland, MD while in the presence of Brandon Scotland, MD.  04/19/23 11:49 AM   Brandon Hart Apr 18, 1946 [id number]  Referring provider: Marjie Skiff, NP [address] [place],  [place] [address]  Chief Complaint  Patient presents with   Results    HPI: 77 year-old male who returns today for follow up for prostate MRI.   Please see previous note for details.   He ha a personal history of fluctuating PSA's. PSA is up to 7.6 8 months ago and trending.  1his rectal exam was somewhat limited.   He underwent prostate MRI that shows multiple lesions including PI-RADS 3 to PI-RADS 4 in the peripheral zone. Prostate volume is 35.   Today, he reports no urinary symptoms. He reports unhealthy eating habits and a smoking history.    PMH: Past Medical History:  Diagnosis Date   Hypertension     Surgical History: Past Surgical History:  Procedure Laterality Date   COLONOSCOPY WITH PROPOFOL N/A 04/11/2016   Procedure: COLONOSCOPY WITH PROPOFOL;  Surgeon: Wyline Mood, MD;  Location: [facility] ENDOSCOPY;  Service: Endoscopy;  Laterality: N/A;   HERNIA REPAIR     VASECTOMY      Home Medications:  Allergies as of 04/19/2023   No Known Allergies      Medication List        Accurate as of April 19, 2023 11:49 AM. If you have any questions, ask your nurse or doctor.          amLODipine 5 MG tablet Commonly known as: NORVASC Take 1 tablet (5 mg total) by mouth daily.   atorvastatin 20 MG tablet Commonly known as: LIPITOR Take 1 tablet (20 mg total) by mouth daily.   lisinopril-hydrochlorothiazide 20-25 MG tablet Commonly known as: ZESTORETIC Take 1 tablet by mouth daily.   umeclidinium-vilanterol 62.5-25 MCG/ACT Aepb Commonly known as: ANORO ELLIPTA Inhale 1 puff into the lungs daily at 6 (six) AM.         Family History: Family History  Problem Relation Age of Onset   Hypertension Mother    Breast cancer Mother    Hypertension Father    Colon cancer Father    Hypertension Brother    Hypertension Brother     Social History:  reports that he has been smoking cigarettes. He has a 25 pack-year smoking history. He has never used smokeless tobacco. He reports that he does not drink alcohol and does not use drugs.   Physical Exam: BP 128/80   Pulse 80   Ht 5\' 9"  (1.753 m)   Wt 199 lb 8 oz (90.5 kg)   BMI 29.46 kg/m   Constitutional:  Alert and oriented, No acute distress. HEENT: [place] AT, moist mucus membranes.  Trachea midline, no masses. Neurologic: Grossly intact, no focal deficits, moving all 4 extremities. Psychiatric: Normal mood and affect.  Imaging : IMPRESSION: 1. Two PI-RADS category 4 lesions in the peripheral zone and a PI-RADS category 3 lesion in the right peripheral zone. Targeting data sent to [facility]. 2. Mild sigmoid colon diverticulosis. 3. Aortoiliac atherosclerosis.     Electronically Signed   By: Gaylyn Rong M.D.   On: 04/04/2023 13:22  MRI personally reviewed.  Agree with radiology interpretation.-  Assessment & Plan:    1. Fluctuating PSA - A recent prostate MRI shows multiple lesions, classified  as PIRADS 3 to PIRADS 4 in the peripheral zone. - Prostate volume is 35 grams, which is considered average. - There is no evidence of aggressive cancer outside the prostate or enlarged lymph nodes. - One lesion has a ~60% chance of being a significant prostate cancer, while another is less concerning. - Asymptomatic and has no symptoms of prostate cancer. - We discussed prostate biopsy in detail including the procedure itself, the risks of blood in the urine, stool, and ejaculate, serious infection, and discomfort.  - Discussed having this completed at our [facility] clinic to perform a fusion biopsy - Discussed a more conservative approach with  close monitoring. He is aware of the need to continue to fusion biopsy if PSA elevates more or symptoms develop  - He has opted for active surveillance at this time, with a plan to monitor PSA levels closely.  He has a PSA rising again in March 2024, he will proceed with fusion biopsy. - Advised to eat healthily and quit smoking to reduce inflammation and potential cancer growth.  Return in about 3 months (around 07/18/2023) for repeat PSA.   [facility] [address] [place], [place] [address] [phone]

## 2023-05-25 ENCOUNTER — Ambulatory Visit: Payer: Self-pay | Admitting: *Deleted

## 2023-05-25 NOTE — Patient Outreach (Signed)
 Care Coordination   Follow Up Visit Note   05/25/2023 Name: Brandon Hart MRN: [id number] DOB: 04-01-1946  Brandon Hart is a 78 y.o. year old male who sees [place], Melanie T, NP for primary care. I spoke with  Brandon Hart by phone today.  What matters to the patients health and wellness today?  Patient report doing well, will reconsider having a prostate biopsy based on new test results in March.  Denies any urgent concerns, encouraged to contact this care manager with questions.      Goals Addressed             This Visit's Progress    Effective management of health conditions       COMPLETED: RNCM Care Management  Expected Outcome:  Monitor, Self-Manage, and Reduce Symptoms of Hypertension   On track    Current Barriers:  Knowledge Deficits related to abnormal labs and how to manage effectively with HTN and other chronic conditions associated with HLD and heart health Chronic Disease Management support and education needs related to effective management of HTN BP Readings from Last 3 Encounters:  04/19/23 128/80  03/16/23 131/80  02/07/23 113/69     Planned Interventions: Evaluation of current treatment plan related to hypertension self management and patient's adherence to plan as established by provider. The patients blood pressures are stable. The patient does not regularly check his blood pressures. RNCM spoke with patient about keeping log of blood pressures and staying hydrated for kidney function. Provided education to patient re: stroke prevention, s/s of heart attack and stroke. Education on how controlling blood pressures and cholesterol levels reduce the risk of heart attack and stroke. ; Reviewed prescribed diet heart healthy diet, the patient states he does not add salt to his food but he really eats what he wants to eat. Denies any issues with swelling in his feet and legs.  Reviewed medications with patient and discussed importance of  compliance. The patient is compliant with medications. The patient states compliance with medications. Denies any acute findings or changes with medications.  Discussed plans with patient for ongoing care management follow up and provided patient with direct contact information for care management team; Advised patient, providing education and rationale, to monitor blood pressure daily and record, calling PCP for findings outside established parameters;  Reviewed scheduled/upcoming provider appointments including:  Advised patient to discuss changes in his blood pressures, changes in his lab values for cholesterol, questions or concerns with provider; Provided education on prescribed diet heart healthy diet, monitoring for starchy foods that can increase triglyceride levels, staying away from fried foods. Received information in mail on heart healthy diet. Education on drinking more water in his diet. He likes to drink sweet tea, milk, and Mt. Dew. Review of monitoring for swelling in his feet and legs. Will continue to monitor for changes in dietary habits. Discussed complications of poorly controlled blood pressure such as heart disease, stroke, circulatory complications, vision complications, kidney impairment, sexual dysfunction;  Screening for signs and symptoms of depression related to chronic disease state;  Assessed social determinant of health barriers;  Smoker >50 years. Was taking medications but this was causing insomnia.   Symptom Management: Take medications as prescribed   Attend all scheduled provider appointments Call provider office for new concerns or questions  call the Suicide and Crisis Lifeline: 988 call the [place]  National Suicide Prevention Lifeline: (206)599-8330 or TTY: 7047975105 TTY 334-355-6185) to talk to a trained counselor call 1-800-273-TALK (toll free, 24  hour hotline) if experiencing a Mental Health or Behavioral Health Crisis  learn about high blood  pressure develop an action plan for high blood pressure keep all doctor appointments take medications for blood pressure exactly as prescribed report new symptoms to your doctor        COMPLETED: RNCM Care Management:   Maintain, Monitor and Self-Manage Symptoms of COPD   On track    Current Barriers:  Care Coordination needs related to inhaler cost and needs from pharm D assistance with obtaining inhaler in a patient with COPD Chronic Disease Management support and education needs related to effective management of COPD Current every day smoker.Unable to tolerate Wellbutrin  for smoking cessation.  Planned Interventions: Provided patient with basic written and verbal COPD education on self care/management/and exacerbation prevention. The patient is doing well and denies any new concerns with his breathing or COPD. States he is compliant with his inhalers. Advised patient to track and manage COPD triggers. Review of triggers. The patient denies any acute changes in his environment causing problems for his breathing.  Provided written and verbal instructions on pursed lip breathing and utilized returned demonstration as teach back Provided instruction about proper use of medications used for management of COPD including inhalers. The patient has received his Anoro inhaler and has worked with the pharm D on cost constraints. The patient was able to get his inhaler at $0 co-pay. The patient was thankful for this. Denies any questions related to inhaler use. States he is using his inhalers without any issues. Education and support provided.  Advised patient to self assesses COPD action plan zone and make appointment with provider if in the yellow zone for 48 hours without improvement Advised patient to engage in light exercise as tolerated 3-5 days a week to aid in the the management of COPD Provided education about and advised patient to utilize infection prevention strategies to reduce risk of  respiratory infection Discussed the importance of adequate rest and management of fatigue with COPD Screening for signs and symptoms of depression related to chronic disease state  Assessed social determinant of health barriers   Symptom Management: Take medications as prescribed   Attend all scheduled provider appointments Call provider office for new concerns or questions  call the Suicide and Crisis Lifeline: 988 call the [place]  National Suicide Prevention Lifeline: 336-080-0606 or TTY: 270-809-4227 TTY 437-685-2322) to talk to a trained counselor call 1-800-273-TALK (toll free, 24 hour hotline) if experiencing a Mental Health or Behavioral Health Crisis  avoid second hand smoke eliminate smoking in my home identify and remove indoor air pollutants limit outdoor activity during cold weather listen for public air quality announcements every day do breathing exercises every day develop a rescue plan eliminate symptom triggers at home follow rescue plan if symptoms flare-up           SDOH assessments and interventions completed:  No     Care Coordination Interventions:  Yes, provided   Follow up plan: Follow up call scheduled for 3/31    Encounter Outcome:  Patient Visit Completed   Brandon Ku, RN, MSN, CCM [place]  [facility], [facility] RN Care Coordinator Direct Dial: [phone] / Main [phone] Fax [phone] Email: [email or web address] Website: [email or web address]

## 2023-07-16 NOTE — Patient Instructions (Signed)

## 2023-07-18 ENCOUNTER — Other Ambulatory Visit: Payer: Self-pay

## 2023-07-21 ENCOUNTER — Encounter: Payer: Self-pay | Admitting: Nurse Practitioner

## 2023-07-21 ENCOUNTER — Ambulatory Visit: Payer: Medicare HMO | Admitting: Nurse Practitioner

## 2023-07-21 VITALS — BP 112/73 | HR 92 | Temp 97.9°F | Wt 193.8 lb

## 2023-07-21 DIAGNOSIS — T6291XA Toxic effect of unspecified noxious substance eaten as food, accidental (unintentional), initial encounter: Secondary | ICD-10-CM | POA: Diagnosis not present

## 2023-07-21 DIAGNOSIS — K521 Toxic gastroenteritis and colitis: Secondary | ICD-10-CM | POA: Diagnosis not present

## 2023-07-21 NOTE — Progress Notes (Signed)
 BP 112/73   Pulse 92   Temp 97.9 F (36.6 C) (Oral)   Wt 193 lb 12.8 oz (87.9 kg)   SpO2 94%   BMI 28.62 kg/m    Subjective:    Patient ID: Brandon Hart, male    DOB: 06-16-1945, 78 y.o.   MRN: [id number]  HPI: Brandon Hart is a 78 y.o. male  Chief Complaint  Patient presents with   Emesis    Patient states he thinks he may have had food poisoning after eating tacos on Tuesday. States he vomited about 4 or 5 times, but has not since then. Patient states he has not had any diarrhea but did have chills.     EMESIS Thinks he may have had food poisoning after eating [employer] on Tuesday.  Threw-up 4-5 times and none since.  No diarrhea.  Did have a fever/chills.  Symptoms have now improved.  Sinuses on right side have been hurting him since that time, takes Tylenol and that knocks it out. Duration:days Onset: sudden Alleviating factors: bland diet, rest Aggravating factors: unknown Status: better Treatments attempted: bland diet and rest + fluid intake Fever: yes -- none further Nausea: yes improved Vomiting: yes improved Weight loss: no Decreased appetite: yes improved Diarrhea: no Constipation: no Blood in stool: no Heartburn: no Jaundice: no Rash: no Dysuria/urinary frequency: no Hematuria: no Recurrent NSAID use: no   Relevant past medical, surgical, family and social history reviewed and updated as indicated. Interim medical history since our last visit reviewed. Allergies and medications reviewed and updated.  Review of Systems  Constitutional:  Positive for appetite change. Negative for activity change, diaphoresis, fatigue and fever.  HENT:  Positive for sinus pressure.   Respiratory:  Negative for cough, chest tightness, shortness of breath and wheezing.   Cardiovascular:  Negative for chest pain, palpitations and leg swelling.  Gastrointestinal:  Positive for diarrhea, nausea and vomiting. Negative for abdominal distention, abdominal  pain and constipation.  Neurological:  Negative for dizziness, syncope, weakness, light-headedness, numbness and headaches.  Psychiatric/Behavioral: Negative.      Per HPI unless specifically indicated above     Objective:    BP 112/73   Pulse 92   Temp 97.9 F (36.6 C) (Oral)   Wt 193 lb 12.8 oz (87.9 kg)   SpO2 94%   BMI 28.62 kg/m   Wt Readings from Last 3 Encounters:  07/21/23 193 lb 12.8 oz (87.9 kg)  04/19/23 199 lb 8 oz (90.5 kg)  03/16/23 198 lb 4 oz (89.9 kg)    Physical Exam Vitals and nursing note reviewed.  Constitutional:      General: He is awake. He is not in acute distress.    Appearance: He is well-developed and well-groomed. He is not ill-appearing or toxic-appearing.  HENT:     Head: Normocephalic.     Right Ear: Hearing and external ear normal.     Left Ear: Hearing and external ear normal.  Eyes:     General: Lids are normal.     Extraocular Movements: Extraocular movements intact.     Conjunctiva/sclera: Conjunctivae normal.  Neck:     Thyroid: No thyromegaly.     Vascular: No carotid bruit.  Cardiovascular:     Rate and Rhythm: Normal rate and regular rhythm.     Heart sounds: Normal heart sounds. No murmur heard.    No gallop.  Pulmonary:     Effort: No accessory muscle usage or respiratory distress.  Breath sounds: Normal breath sounds.  Abdominal:     General: Bowel sounds are normal. There is no distension.     Palpations: Abdomen is soft.     Tenderness: There is no abdominal tenderness. There is no right CVA tenderness or left CVA tenderness.  Musculoskeletal:     Cervical back: Full passive range of motion without pain.     Right lower leg: No edema.     Left lower leg: No edema.  Lymphadenopathy:     Cervical: No cervical adenopathy.  Skin:    General: Skin is warm.     Capillary Refill: Capillary refill takes less than 2 seconds.  Neurological:     Mental Status: He is alert and oriented to person, place, and time.      Deep Tendon Reflexes: Reflexes are normal and symmetric.     Reflex Scores:      Brachioradialis reflexes are 2+ on the right side and 2+ on the left side.      Patellar reflexes are 2+ on the right side and 2+ on the left side. Psychiatric:        Attention and Perception: Attention normal.        Mood and Affect: Mood normal.        Speech: Speech normal.        Behavior: Behavior normal. Behavior is cooperative.        Thought Content: Thought content normal.     Results for orders placed or performed in visit on 02/07/23  Microalbumin, Urine Waived   Collection Time: 02/07/23 10:07 AM  Result Value Ref Range   Microalb, Ur Waived 30 (H) 0 - 19 mg/L   Creatinine, Urine Waived 200 10 - 300 mg/dL   Microalb/Creat Ratio <30 <30 mg/g  Comprehensive metabolic panel   Collection Time: 02/07/23 10:08 AM  Result Value Ref Range   Glucose 95 70 - 99 mg/dL   BUN 24 8 - 27 mg/dL   Creatinine, Ser 4.09 (H) 0.76 - 1.27 mg/dL   eGFR 53 (L) >81 XB/JYN/8.29   BUN/Creatinine Ratio 17 10 - 24   Sodium 137 134 - 144 mmol/L   Potassium 4.2 3.5 - 5.2 mmol/L   Chloride 99 96 - 106 mmol/L   CO2 25 20 - 29 mmol/L   Calcium 9.6 8.6 - 10.2 mg/dL   Total Protein 7.0 6.0 - 8.5 g/dL   Albumin 4.7 3.8 - 4.8 g/dL   Globulin, Total 2.3 1.5 - 4.5 g/dL   Bilirubin Total 0.3 0.0 - 1.2 mg/dL   Alkaline Phosphatase 107 44 - 121 IU/L   AST 15 0 - 40 IU/L   ALT 19 0 - 44 IU/L  Lipid Panel w/o Chol/HDL Ratio   Collection Time: 02/07/23 10:08 AM  Result Value Ref Range   Cholesterol, Total 155 100 - 199 mg/dL   Triglycerides 562 (H) 0 - 149 mg/dL   HDL 27 (L) >13 mg/dL   VLDL Cholesterol Cal 60 (H) 5 - 40 mg/dL   LDL Chol Calc (NIH) 68 0 - 99 mg/dL  HgB Y8M   Collection Time: 02/07/23 10:08 AM  Result Value Ref Range   Hgb A1c MFr Bld 6.4 (H) 4.8 - 5.6 %   Est. average glucose Bld gHb Est-mCnc 137 mg/dL  PSA, total and free   Collection Time: 02/07/23 10:08 AM  Result Value Ref Range   Prostate  Specific Ag, Serum 7.6 (H) 0.0 - 4.0 ng/mL   PSA, Free 1.72  N/A ng/mL   PSA, Free Pct 22.6 %      Assessment & Plan:   Problem List Items Addressed This Visit       Digestive   Gastroenteritis due to food toxin - Primary   Acute and now improved.  Recommend he continue to hydrate well at home and rest.          Follow up plan: Return for as scheduled March 27th.

## 2023-07-21 NOTE — Assessment & Plan Note (Signed)
 Acute and now improved.  Recommend he continue to hydrate well at home and rest.

## 2023-07-22 ENCOUNTER — Other Ambulatory Visit: Payer: Self-pay | Admitting: Nurse Practitioner

## 2023-07-24 NOTE — Telephone Encounter (Signed)
 Requested medication (s) are due for refill today: unsure  Requested medication (s) are on the active medication list: no  Last refill:  07/27/22 #90 3 RF  Future visit scheduled: yes  Notes to clinic:  request from pharmacy- since resp drug wanted double checked if no loner needed/ the end date 07/22/23   Requested Prescriptions  Pending Prescriptions Disp Refills   ANORO ELLIPTA 62.5-25 MCG/ACT AEPB [Pharmacy Med Name: Ernestina Patches 62.5-25 MCG INH] 180 each 3    Sig: INHALE 1 PUFF INTO THE LUNGS DAILY AT 6 (SIX) AM.     Pulmonology:  Combination Products Passed - 07/24/2023  2:02 PM      Passed - Valid encounter within last 12 months    Recent Outpatient Visits           5 months ago Centrilobular emphysema (HCC)   [place] [facility] [place], Shadeland T, NP   7 months ago Elevated glucose level   [place] [facility] [place], Allen T, NP   10 months ago Centrilobular emphysema (HCC)   [place] [facility] T[place], Corrie Dandy T, NP   1 year ago Medicare annual wellness visit, subsequent   [place] [facility] [place], Corrie Dandy T, NP   1 year ago Epididymitis   [place] [facility] Larae Grooms, NP       Future Appointments             Tomorrow Vanna [place], MD [facility] [place]   In 2 weeks [place], Dorie Rank, NP  [facility]

## 2023-07-25 ENCOUNTER — Ambulatory Visit: Payer: Self-pay | Admitting: Urology

## 2023-07-25 ENCOUNTER — Encounter: Payer: Self-pay | Admitting: Urology

## 2023-07-25 ENCOUNTER — Ambulatory Visit: Payer: Medicare HMO

## 2023-07-31 ENCOUNTER — Ambulatory Visit (INDEPENDENT_AMBULATORY_CARE_PROVIDER_SITE_OTHER): Payer: Medicare HMO

## 2023-07-31 VITALS — Ht 69.0 in | Wt 193.0 lb

## 2023-07-31 DIAGNOSIS — Z0000 Encounter for general adult medical examination without abnormal findings: Secondary | ICD-10-CM | POA: Diagnosis not present

## 2023-07-31 DIAGNOSIS — Z2821 Immunization not carried out because of patient refusal: Secondary | ICD-10-CM | POA: Diagnosis not present

## 2023-07-31 NOTE — Patient Instructions (Signed)
 Brandon Hart , Thank you for taking time to come for your Medicare Wellness Visit. I appreciate your ongoing commitment to your health goals. Please review the following plan we discussed and let me know if I can assist you in the future.   Referrals/Orders/Follow-Ups/Clinician Recommendations:   This is a list of the screening recommended for you and due dates:  Health Maintenance  Topic Date Due   Pneumonia Vaccine (1 of 2 - PCV) Never done   Screening for Lung Cancer  07/22/2023   Flu Shot  08/14/2023*   Zoster (Shingles) Vaccine (1 of 2) 10/16/2023*   Medicare Annual Wellness Visit  07/30/2024   Colon Cancer Screening  04/11/2026   Hepatitis C Screening  Completed   HPV Vaccine  Aged Out   DTaP/Tdap/Td vaccine  Discontinued   COVID-19 Vaccine  Discontinued  *Topic was postponed. The date shown is not the original due date.    Advanced directives: (Declined) Advance directive discussed with you today. Even though you declined this today, please call our office should you change your mind, and we can give you the proper paperwork for you to fill out.  Next Medicare Annual Wellness Visit scheduled for next year: Yes

## 2023-07-31 NOTE — Progress Notes (Signed)
 Because this visit was a virtual/telehealth visit,  certain criteria was not obtained, such a blood pressure, CBG if applicable, and timed get up and go. Any medications not marked as "taking" were not mentioned during the medication reconciliation part of the visit. Any vitals not documented were not able to be obtained due to this being a telehealth visit or patient was unable to self-report a recent blood pressure reading due to a lack of equipment at home via telehealth. Vitals that have been documented are verbally provided by the patient.   Subjective:   Hien Perreira is a 78 y.o. who presents for a Medicare Wellness preventive visit.  Visit Complete: Virtual I connected with  Raylene Miyamoto on 07/31/23 by a audio enabled telemedicine application and verified that I am speaking with the correct person using two identifiers.  Patient Location: Home  Provider Location: Home Office  I discussed the limitations of evaluation and management by telemedicine. The patient expressed understanding and agreed to proceed.  Vital Signs: Because this visit was a virtual/telehealth visit, some criteria may be missing or patient reported. Any vitals not documented were not able to be obtained and vitals that have been documented are patient reported.  VideoDeclined- This patient declined Librarian, academic. Therefore the visit was completed with audio only.  Persons Participating in Visit: Patient.  AWV Questionnaire: No: Patient Medicare AWV questionnaire was not completed prior to this visit.  Cardiac Risk Factors include: advanced age (>62men, >56 women);hypertension;male gender     Objective:    Today's Vitals   07/31/23 1516  Weight: 193 lb (87.5 kg)  Height: 5\' 9"  (1.753 m)   Body mass index is 28.5 kg/m.     07/31/2023    3:26 PM 12/04/2022    4:00 PM 07/25/2022    2:06 PM 07/20/2021    9:04 AM 07/06/2020    3:16 PM 07/03/2019   11:00  AM 07/14/2017    1:07 PM  Advanced Directives  Does Patient Have a Medical Advance Directive? No No No No No No No  Would patient like information on creating a medical advance directive? No - Patient declined  No - Patient declined No - Patient declined   No - Patient declined    Current Medications (verified) Outpatient Encounter Medications as of 07/31/2023  Medication Sig   amLODipine (NORVASC) 5 MG tablet Take 1 tablet (5 mg total) by mouth daily.   ANORO ELLIPTA 62.5-25 MCG/ACT AEPB INHALE 1 PUFF INTO THE LUNGS DAILY AT 6 (SIX) AM.   atorvastatin (LIPITOR) 20 MG tablet Take 1 tablet (20 mg total) by mouth daily.   lisinopril-hydrochlorothiazide (ZESTORETIC) 20-25 MG tablet Take 1 tablet by mouth daily.   No facility-administered encounter medications on file as of 07/31/2023.    Allergies (verified) Patient has no known allergies.   History: Past Medical History:  Diagnosis Date   Hypertension    Past Surgical History:  Procedure Laterality Date   COLONOSCOPY WITH PROPOFOL N/A 04/11/2016   Procedure: COLONOSCOPY WITH PROPOFOL;  Surgeon: Wyline Mood, MD;  Location: [facility] ENDOSCOPY;  Service: Endoscopy;  Laterality: N/A;   HERNIA REPAIR     VASECTOMY     Family History  Problem Relation Age of Onset   Hypertension Mother    Breast cancer Mother    Hypertension Father    Colon cancer Father    Hypertension Brother    Hypertension Brother    Social History   Socioeconomic History  Marital status: Single    Spouse name: Not on file   Number of children: Not on file   Years of education: Not on file   Highest education level: Not on file  Occupational History   Occupation: retired  Tobacco Use   Smoking status: Every Day    Current packs/day: 0.50    Average packs/day: 0.5 packs/day for 50.0 years (25.0 ttl pk-yrs)    Types: Cigarettes   Smokeless tobacco: Never  Vaping Use   Vaping status: Never Used  Substance and Sexual Activity   Alcohol use: No     Comment: pt states he has not drink anything in about a year   Drug use: No   Sexual activity: Yes  Other Topics Concern   Not on file  Social History Narrative   Not on file   Social Drivers of Health   Financial Resource Strain: Low Risk  (07/31/2023)   Overall Financial Resource Strain (CARDIA)    Difficulty of Paying Living Expenses: Not very hard  Food Insecurity: No Food Insecurity (07/31/2023)   Hunger Vital Sign    Worried About Running Out of Food in the Last Year: Never true    Ran Out of Food in the Last Year: Never true  Transportation Needs: No Transportation Needs (07/31/2023)   PRAPARE - Administrator, Civil Service (Medical): No    Lack of Transportation (Non-Medical): No  Physical Activity: Insufficiently Active (07/31/2023)   Exercise Vital Sign    Days of Exercise per Week: 1 day    Minutes of Exercise per Session: 20 min  Stress: No Stress Concern Present (07/31/2023)   [employer] of Occupational Health - Occupational Stress Questionnaire    Feeling of Stress : Not at all  Social Connections: Socially Isolated (03/23/2023)   Social Connection and Isolation Panel [NHANES]    Frequency of Communication with Friends and Family: Once a week    Frequency of Social Gatherings with Friends and Family: Once a week    Attends Religious Services: More than 4 times per year    Active Member of [employer] or Organizations: No    Attends Engineer, structural: Never    Marital Status: Divorced    Tobacco Counseling Ready to quit: Not Answered Counseling given: Not Answered    Clinical Intake:  Pre-visit preparation completed: Yes  Pain : 0-10 Pain Location: Nose Pain Orientation: Right     BMI - recorded: 28.5 Nutritional Status: BMI 25 -29 Overweight Nutritional Risks: None Diabetes: No  How often do you need to have someone help you when you read instructions, pamphlets, or other written materials from your doctor or pharmacy?: 1 -  Never What is the last grade level you completed in school?: 12th grade  Interpreter Needed?: No  Information entered by :: Linell Meldrum CMA   Activities of Daily Living     07/31/2023    3:24 PM  In your present state of health, do you have any difficulty performing the following activities:  Hearing? 0  Vision? 0  Difficulty concentrating or making decisions? 0  Walking or climbing stairs? 0  Dressing or bathing? 0  Doing errands, shopping? 0  Preparing Food and eating ? N  Using the Toilet? N  In the past six months, have you accidently leaked urine? N  Do you have problems with loss of bowel control? N  Managing your Medications? N  Managing your Finances? N  Housekeeping or managing your Housekeeping? N  Patient Care Team: Marjie Skiff, NP as PCP - General (Nurse Practitioner) Rodney Langton, RN as Case Manager (General Practice)  Indicate any recent Medical Services you may have received from other than [facility] providers in the past year (date may be approximate).     Assessment:   This is a routine wellness examination for [facility].  Hearing/Vision screen Hearing Screening - Comments:: Denies hearing issues Vision Screening - Comments:: Denies vision problems after surgery  [facility]    Goals Addressed             This Visit's Progress    Patient Stated       Win the lottery       Depression Screen     07/31/2023    3:28 PM 02/07/2023    9:46 AM 12/13/2022    1:30 PM 07/25/2022    2:05 PM 07/21/2022    8:37 AM 06/13/2022   10:30 AM 01/19/2022    9:27 AM  PHQ 2/9 Scores  PHQ - 2 Score 0 0 0 0 0 0 0  PHQ- 9 Score 2 0 0 0 1 0 1    Fall Risk     07/31/2023    3:26 PM 02/07/2023    9:46 AM 12/13/2022    1:30 PM 08/03/2022   10:08 AM 07/25/2022    2:07 PM  Fall Risk   Falls in the past year? 0 0 1 0 0  Number falls in past yr: 0 0 1 0 0  Injury with Fall? 0 0 0 0 0  Risk for fall due to : No Fall Risks No Fall Risks History of fall(s) No  Fall Risks No Fall Risks  Follow up Falls prevention discussed;Falls evaluation completed Falls evaluation completed Falls evaluation completed Falls evaluation completed;Education provided Falls prevention discussed;Falls evaluation completed    MEDICARE RISK AT HOME:  Medicare Risk at Home Any stairs in or around the home?: No If so, are there any without handrails?: No Home free of loose throw rugs in walkways, pet beds, electrical cords, etc?: No Adequate lighting in your home to reduce risk of falls?: Yes Life alert?: No Use of a cane, walker or w/c?: No Grab bars in the bathroom?: No Shower chair or bench in shower?: No Elevated toilet seat or a handicapped toilet?: No  TIMED UP AND GO:  Was the test performed?  No  Cognitive Function: 6CIT completed        07/31/2023    3:20 PM 07/25/2022    2:11 PM 07/21/2022    8:54 AM 07/06/2020    3:18 PM 07/03/2019   10:58 AM  6CIT Screen  What Year? 0 points 0 points 0 points 0 points 0 points  What month? 0 points 0 points 0 points 0 points 0 points  What time? 0 points 0 points 0 points 0 points 0 points  Count back from 20 0 points 0 points 0 points 0 points 0 points  Months in reverse 4 points 4 points 2 points 4 points 0 points  Repeat phrase 0 points 4 points 2 points 4 points 0 points  Total Score 4 points 8 points 4 points 8 points 0 points    Immunizations  There is no immunization history on file for this patient.  Screening Tests Health Maintenance  Topic Date Due   Pneumonia Vaccine 2+ Years old (1 of 2 - PCV) Never done   Lung Cancer Screening  07/22/2023   INFLUENZA VACCINE  08/14/2023 (  Originally 12/15/2022)   Zoster Vaccines- Shingrix (1 of 2) 10/16/2023 (Originally 01/02/1998)   Medicare Annual Wellness (AWV)  07/30/2024   Colonoscopy  04/11/2026   Hepatitis C Screening  Completed   HPV VACCINES  Aged Out   DTaP/Tdap/Td  Discontinued   COVID-19 Vaccine  Discontinued    Health Maintenance  Health  Maintenance Due  Topic Date Due   Pneumonia Vaccine 68+ Years old (1 of 2 - PCV) Never done   Lung Cancer Screening  07/22/2023   Health Maintenance Items Addressed: discussed immunizations and Lung cancer screening  Additional Screening:  Vision Screening: Recommended annual ophthalmology exams for early detection of glaucoma and other disorders of the eye.  Dental Screening: Recommended annual dental exams for proper oral hygiene  Community Resource Referral / Chronic Care Management: CRR required this visit?  No   CCM required this visit?  No     Plan:     I have personally reviewed and noted the following in the patient's chart:   Medical and social history Use of alcohol, tobacco or illicit drugs  Current medications and supplements including opioid prescriptions. Patient is not currently taking opioid prescriptions. Functional ability and status Nutritional status Physical activity Advanced directives List of other physicians Hospitalizations, surgeries, and ER visits in previous 12 months Vitals Screenings to include cognitive, depression, and falls Referrals and appointments  In addition, I have reviewed and discussed with patient certain preventive protocols, quality metrics, and best practice recommendations. A written personalized care plan for preventive services as well as general preventive health recommendations were provided to patient.     Rudi Heap, [place]   07/31/2023   After Visit Summary: (Declined) Due to this being a telephonic visit, with patients personalized plan was offered to patient but patient Declined AVS at this time   Notes: Nothing significant to report at this time.

## 2023-08-06 NOTE — Patient Instructions (Incomplete)

## 2023-08-10 ENCOUNTER — Encounter: Payer: Self-pay | Admitting: Nurse Practitioner

## 2023-08-10 ENCOUNTER — Ambulatory Visit (INDEPENDENT_AMBULATORY_CARE_PROVIDER_SITE_OTHER): Payer: Self-pay | Admitting: Nurse Practitioner

## 2023-08-10 VITALS — BP 128/80 | HR 85 | Temp 97.9°F | Ht 68.9 in | Wt 191.2 lb

## 2023-08-10 DIAGNOSIS — J432 Centrilobular emphysema: Secondary | ICD-10-CM

## 2023-08-10 DIAGNOSIS — I700 Atherosclerosis of aorta: Secondary | ICD-10-CM | POA: Diagnosis not present

## 2023-08-10 DIAGNOSIS — R7309 Other abnormal glucose: Secondary | ICD-10-CM

## 2023-08-10 DIAGNOSIS — I10 Essential (primary) hypertension: Secondary | ICD-10-CM

## 2023-08-10 DIAGNOSIS — E559 Vitamin D deficiency, unspecified: Secondary | ICD-10-CM | POA: Diagnosis not present

## 2023-08-10 DIAGNOSIS — I25118 Atherosclerotic heart disease of native coronary artery with other forms of angina pectoris: Secondary | ICD-10-CM

## 2023-08-10 DIAGNOSIS — E782 Mixed hyperlipidemia: Secondary | ICD-10-CM

## 2023-08-10 DIAGNOSIS — R972 Elevated prostate specific antigen [PSA]: Secondary | ICD-10-CM | POA: Diagnosis not present

## 2023-08-10 DIAGNOSIS — Z0000 Encounter for general adult medical examination without abnormal findings: Secondary | ICD-10-CM | POA: Diagnosis not present

## 2023-08-10 DIAGNOSIS — F17210 Nicotine dependence, cigarettes, uncomplicated: Secondary | ICD-10-CM

## 2023-08-10 DIAGNOSIS — J0110 Acute frontal sinusitis, unspecified: Secondary | ICD-10-CM | POA: Insufficient documentation

## 2023-08-10 DIAGNOSIS — F5101 Primary insomnia: Secondary | ICD-10-CM

## 2023-08-10 LAB — MICROALBUMIN, URINE WAIVED
Creatinine, Urine Waived: 100 mg/dL (ref 10–300)
Microalb, Ur Waived: 80 mg/L — ABNORMAL HIGH (ref 0–19)

## 2023-08-10 MED ORDER — AMOXICILLIN-POT CLAVULANATE 875-125 MG PO TABS: 1.0000 | ORAL_TABLET | Freq: Two times a day (BID) | ORAL | 0 refills | Status: AC

## 2023-08-10 NOTE — Assessment & Plan Note (Signed)
 Ongoing, followed by urology, continue this collaboration.  Recent notes reviewed.

## 2023-08-10 NOTE — Progress Notes (Signed)
 BP 128/80 (BP Location: Left Arm, Patient Position: Sitting)   Pulse 85   Temp 97.9 F (36.6 C) (Oral)   Ht 5' 8.9" (1.75 m)   Wt 191 lb 3.2 oz (86.7 kg)   SpO2 97%   BMI 28.32 kg/m    Subjective:    Patient ID: Brandon Hart, male    DOB: 1946/03/03, 78 y.o.   MRN: [id number]  HPI: Brandon Hart is a 78 y.o. male presenting on 08/10/2023 for comprehensive medical examination. Current medical complaints include:none  He currently lives with: self Interim Problems from his last visit: no   HYPERTENSION / HYPERLIPIDEMIA Continues Lipitor, Norvasc & Lisinopril-HCTZ.  Satisfied with current treatment? yes Duration of hypertension: chronic BP monitoring frequency: not checking BP range: not checking BP medication side effects: no Duration of hyperlipidemia: chronic Cholesterol medication side effects: no Cholesterol supplements: none Medication compliance: good compliance Aspirin: no Recent stressors: no Recurrent headaches: no Visual changes: no Palpitations: no Dyspnea: no Chest pain: no Lower extremity edema: no Dizzy/lightheaded: no    COPD Last lung CA screening was 07/22/22, continues to show centrilobular and paraseptal emphysema + aortic atherosclerosis.  Continues to smoke 1/2 pack per day, used to smoke 1 PPD.  Not interested in quitting at this time.  Does have a headache ongoing since recent illness behind right eye and pressure to right sinuses. Rhinnorhea and congestion present + continues to cough up "junk". Pressure makes his teeth hurt.  Using Anoro via assistance program, reports this seems to help him a lot. COPD status: stable Satisfied with current treatment?: yes Oxygen use: no Dyspnea frequency: none Cough frequency: as above Rescue inhaler frequency: no use Limitation of activity: no Productive cough: none Last Spirometry: 07/21/22 FEV1 79% and FEV1/FVC 98% Pneumovax: Not up to Date -- refuses Influenza: Not Up to Date --  refuses  PSA ELEVATION Last saw urology on 04/19/23, watchful waiting. Nocturia: 1/night Urinary frequency:no Incomplete voiding: no Urgency: no Weak urinary stream: no Straining to start stream: no  Functional Status Survey: Is the patient deaf or have difficulty hearing?: No Does the patient have difficulty seeing, even when wearing glasses/contacts?: No Does the patient have difficulty concentrating, remembering, or making decisions?: No Does the patient have difficulty walking or climbing stairs?: No Does the patient have difficulty dressing or bathing?: No Does the patient have difficulty doing errands alone such as visiting a doctor's office or shopping?: No  FALL RISK:    08/10/2023   10:40 AM 07/31/2023    3:26 PM 02/07/2023    9:46 AM 12/13/2022    1:30 PM 08/03/2022   10:08 AM  Fall Risk   Falls in the past year? 0 0 0 1 0  Number falls in past yr: 0 0 0 1 0  Injury with Fall? 0 0 0 0 0  Risk for fall due to : No Fall Risks No Fall Risks No Fall Risks History of fall(s) No Fall Risks  Follow up Falls evaluation completed Falls prevention discussed;Falls evaluation completed Falls evaluation completed Falls evaluation completed Falls evaluation completed;Education provided       08/10/2023   10:41 AM 07/31/2023    3:28 PM 02/07/2023    9:46 AM 12/13/2022    1:30 PM 07/25/2022    2:05 PM  Depression screen PHQ 2/9  Decreased Interest 0 0 0 0 0  Down, Depressed, Hopeless 0 0 0 0 0  PHQ - 2 Score 0 0 0 0 0  Altered sleeping  0 1 0 0 0  Tired, decreased energy 0 1 0 0 0  Change in appetite 0 0 0 0 0  Feeling bad or failure about yourself  0 0 0 0 0  Trouble concentrating 0 0 0 0 0  Moving slowly or fidgety/restless 0 0 0 0 0  Suicidal thoughts 0 0 0 0 0  PHQ-9 Score 0 2 0 0 0  Difficult doing work/chores Not difficult at all Not difficult at all Not difficult at all Not difficult at all Not difficult at all      08/10/2023   10:41 AM 02/07/2023    9:47 AM 12/13/2022     1:31 PM 06/13/2022   10:30 AM  GAD 7 : Generalized Anxiety Score  Nervous, Anxious, on Edge 0 0 0 0  Control/stop worrying 0 0 0 0  Worry too much - different things 0 0 0 0  Trouble relaxing 0 0 0 0  Restless 0 0 0 0  Easily annoyed or irritable 0 0 0 0  Afraid - awful might happen 0 0 0 0  Total GAD 7 Score 0 0 0 0  Anxiety Difficulty Not difficult at all Not difficult at all Not difficult at all Not difficult at all   Past Medical History:  Past Medical History:  Diagnosis Date   Hypertension     Surgical History:  Past Surgical History:  Procedure Laterality Date   COLONOSCOPY WITH PROPOFOL N/A 04/11/2016   Procedure: COLONOSCOPY WITH PROPOFOL;  Surgeon: Wyline Mood, MD;  Location: [facility] ENDOSCOPY;  Service: Endoscopy;  Laterality: N/A;   HERNIA REPAIR     VASECTOMY      Medications:  Current Outpatient Medications on File Prior to Visit  Medication Sig   amLODipine (NORVASC) 5 MG tablet Take 1 tablet (5 mg total) by mouth daily.   ANORO ELLIPTA 62.5-25 MCG/ACT AEPB INHALE 1 PUFF INTO THE LUNGS DAILY AT 6 (SIX) AM.   atorvastatin (LIPITOR) 20 MG tablet Take 1 tablet (20 mg total) by mouth daily.   lisinopril-hydrochlorothiazide (ZESTORETIC) 20-25 MG tablet Take 1 tablet by mouth daily.   No current facility-administered medications on file prior to visit.    Allergies:  No Known Allergies  Social History:  Social History   Socioeconomic History   Marital status: Single    Spouse name: Not on file   Number of children: Not on file   Years of education: Not on file   Highest education level: Not on file  Occupational History   Occupation: retired  Tobacco Use   Smoking status: Every Day    Current packs/day: 0.50    Average packs/day: 0.5 packs/day for 50.0 years (25.0 ttl pk-yrs)    Types: Cigarettes   Smokeless tobacco: Never  Vaping Use   Vaping status: Never Used  Substance and Sexual Activity   Alcohol use: No    Comment: pt states he has not  drink anything in about a year   Drug use: No   Sexual activity: Yes  Other Topics Concern   Not on file  Social History Narrative   Not on file   Social Drivers of Health   Financial Resource Strain: Low Risk  (07/31/2023)   Overall Financial Resource Strain (CARDIA)    Difficulty of Paying Living Expenses: Not very hard  Food Insecurity: No Food Insecurity (07/31/2023)   Hunger Vital Sign    Worried About Running Out of Food in the Last Year: Never true  Ran Out of Food in the Last Year: Never true  Transportation Needs: No Transportation Needs (07/31/2023)   PRAPARE - Administrator, Civil Service (Medical): No    Lack of Transportation (Non-Medical): No  Physical Activity: Insufficiently Active (07/31/2023)   Exercise Vital Sign    Days of Exercise per Week: 1 day    Minutes of Exercise per Session: 20 min  Stress: No Stress Concern Present (07/31/2023)   [employer] of Occupational Health - Occupational Stress Questionnaire    Feeling of Stress : Not at all  Social Connections: Socially Isolated (03/23/2023)   Social Connection and Isolation Panel [NHANES]    Frequency of Communication with Friends and Family: Once a week    Frequency of Social Gatherings with Friends and Family: Once a week    Attends Religious Services: More than 4 times per year    Active Member of [employer] or Organizations: No    Attends Banker Meetings: Never    Marital Status: Divorced  Catering manager Violence: Not At Risk (07/25/2022)   Humiliation, Afraid, Rape, and Kick questionnaire    Fear of Current or Ex-Partner: No    Emotionally Abused: No    Physically Abused: No    Sexually Abused: No   Social History   Tobacco Use  Smoking Status Every Day   Current packs/day: 0.50   Average packs/day: 0.5 packs/day for 50.0 years (25.0 ttl pk-yrs)   Types: Cigarettes  Smokeless Tobacco Never   Social History   Substance and Sexual Activity  Alcohol Use No    Comment: pt states he has not drink anything in about a year    Family History:  Family History  Problem Relation Age of Onset   Hypertension Mother    Breast cancer Mother    Hypertension Father    Colon cancer Father    Hypertension Brother    Hypertension Brother     Past medical history, surgical history, medications, allergies, family history and social history reviewed with patient today and changes made to appropriate areas of the chart.   Review of Systems - negative All other ROS negative except what is listed above and in the HPI.      Objective:    BP 128/80 (BP Location: Left Arm, Patient Position: Sitting)   Pulse 85   Temp 97.9 F (36.6 C) (Oral)   Ht 5' 8.9" (1.75 m)   Wt 191 lb 3.2 oz (86.7 kg)   SpO2 97%   BMI 28.32 kg/m   Wt Readings from Last 3 Encounters:  08/10/23 191 lb 3.2 oz (86.7 kg)  07/31/23 193 lb (87.5 kg)  07/21/23 193 lb 12.8 oz (87.9 kg)    Physical Exam Vitals and nursing note reviewed.  Constitutional:      General: He is awake. He is not in acute distress.    Appearance: He is well-developed and well-groomed. He is not ill-appearing or toxic-appearing.  HENT:     Head: Normocephalic and atraumatic.     Right Ear: Hearing, ear canal and external ear normal. No drainage. A middle ear effusion is present. There is no impacted cerumen. Tympanic membrane is not injected.     Left Ear: Hearing, ear canal and external ear normal. No drainage. A middle ear effusion is present. There is no impacted cerumen. Tympanic membrane is not injected.     Nose: Rhinorrhea present. Rhinorrhea is clear.     Right Sinus: Frontal sinus tenderness present. No maxillary  sinus tenderness.     Left Sinus: No maxillary sinus tenderness or frontal sinus tenderness.     Mouth/Throat:     Mouth: Mucous membranes are moist.     Pharynx: Uvula midline. Posterior oropharyngeal erythema (mild) present. No pharyngeal swelling or oropharyngeal exudate.  Eyes:      General: Lids are normal.        Right eye: No discharge.        Left eye: No discharge.     Extraocular Movements: Extraocular movements intact.     Conjunctiva/sclera: Conjunctivae normal.     Pupils: Pupils are equal, round, and reactive to light.     Visual Fields: Right eye visual fields normal and left eye visual fields normal.  Neck:     Thyroid: No thyromegaly.     Vascular: No carotid bruit or JVD.     Trachea: Trachea normal.  Cardiovascular:     Rate and Rhythm: Normal rate and regular rhythm.     Heart sounds: Normal heart sounds, S1 normal and S2 normal. No murmur heard.    No gallop.  Pulmonary:     Effort: Pulmonary effort is normal. No accessory muscle usage or respiratory distress.     Breath sounds: Normal breath sounds. No decreased breath sounds, wheezing or rales.  Abdominal:     General: Bowel sounds are normal.     Palpations: Abdomen is soft. There is no hepatomegaly or splenomegaly.     Tenderness: There is no abdominal tenderness.  Musculoskeletal:        General: Normal range of motion.     Cervical back: Normal range of motion and neck supple.     Right lower leg: No edema.     Left lower leg: No edema.  Lymphadenopathy:     Head:     Right side of head: No submental, submandibular, tonsillar, preauricular or posterior auricular adenopathy.     Left side of head: No submental, submandibular, tonsillar, preauricular or posterior auricular adenopathy.     Cervical: No cervical adenopathy.  Skin:    General: Skin is warm and dry.     Capillary Refill: Capillary refill takes less than 2 seconds.     Findings: No rash.  Neurological:     Mental Status: He is alert and oriented to person, place, and time.     Gait: Gait is intact.     Deep Tendon Reflexes: Reflexes are normal and symmetric.     Reflex Scores:      Brachioradialis reflexes are 2+ on the right side and 2+ on the left side.      Patellar reflexes are 2+ on the right side and 2+ on the left  side. Psychiatric:        Attention and Perception: Attention normal.        Mood and Affect: Mood normal.        Speech: Speech normal.        Behavior: Behavior normal. Behavior is cooperative.        Thought Content: Thought content normal.        Cognition and Memory: Cognition normal.       07/31/2023    3:20 PM 07/25/2022    2:11 PM 07/21/2022    8:54 AM 07/06/2020    3:18 PM 07/03/2019   10:58 AM  6CIT Screen  What Year? 0 points 0 points 0 points 0 points 0 points  What month? 0 points 0 points 0 points 0 points  0 points  What time? 0 points 0 points 0 points 0 points 0 points  Count back from 20 0 points 0 points 0 points 0 points 0 points  Months in reverse 4 points 4 points 2 points 4 points 0 points  Repeat phrase 0 points 4 points 2 points 4 points 0 points  Total Score 4 points 8 points 4 points 8 points 0 points     Results for orders placed or performed in visit on 02/07/23  Microalbumin, Urine Waived   Collection Time: 02/07/23 10:07 AM  Result Value Ref Range   Microalb, Ur Waived 30 (H) 0 - 19 mg/L   Creatinine, Urine Waived 200 10 - 300 mg/dL   Microalb/Creat Ratio <30 <30 mg/g  Comprehensive metabolic panel   Collection Time: 02/07/23 10:08 AM  Result Value Ref Range   Glucose 95 70 - 99 mg/dL   BUN 24 8 - 27 mg/dL   Creatinine, Ser 5.62 (H) 0.76 - 1.27 mg/dL   eGFR 53 (L) >13 YQ/MVH/8.46   BUN/Creatinine Ratio 17 10 - 24   Sodium 137 134 - 144 mmol/L   Potassium 4.2 3.5 - 5.2 mmol/L   Chloride 99 96 - 106 mmol/L   CO2 25 20 - 29 mmol/L   Calcium 9.6 8.6 - 10.2 mg/dL   Total Protein 7.0 6.0 - 8.5 g/dL   Albumin 4.7 3.8 - 4.8 g/dL   Globulin, Total 2.3 1.5 - 4.5 g/dL   Bilirubin Total 0.3 0.0 - 1.2 mg/dL   Alkaline Phosphatase 107 44 - 121 IU/L   AST 15 0 - 40 IU/L   ALT 19 0 - 44 IU/L  Lipid Panel w/o Chol/HDL Ratio   Collection Time: 02/07/23 10:08 AM  Result Value Ref Range   Cholesterol, Total 155 100 - 199 mg/dL   Triglycerides 962 (H) 0 -  149 mg/dL   HDL 27 (L) >95 mg/dL   VLDL Cholesterol Cal 60 (H) 5 - 40 mg/dL   LDL Chol Calc (NIH) 68 0 - 99 mg/dL  HgB M8U   Collection Time: 02/07/23 10:08 AM  Result Value Ref Range   Hgb A1c MFr Bld 6.4 (H) 4.8 - 5.6 %   Est. average glucose Bld gHb Est-mCnc 137 mg/dL  PSA, total and free   Collection Time: 02/07/23 10:08 AM  Result Value Ref Range   Prostate Specific Ag, Serum 7.6 (H) 0.0 - 4.0 ng/mL   PSA, Free 1.72 N/A ng/mL   PSA, Free Pct 22.6 %      Assessment & Plan:   Problem List Items Addressed This Visit       Cardiovascular and Mediastinum   Aortic atherosclerosis (HCC)   Chronic.  Noted on lung CA CT screening, continue statin daily and recommend daily Baby ASA.  Recommend complete cessation smoking for prevention.      Coronary artery disease of native artery of native heart with stable angina pectoris (HCC)   Chronic and stable with no recent CP.  Continue current medications and collaboration with cardiology as needed.      Relevant Orders   TSH   Hypertension   Chronic, stable.  BP at goal today.  Recommend he start checking BP at home three mornings a week and documenting for provider.  Discussed goal BP with him.  Continue current medication regimen and adjust as needed.  LABS: CMP, CBC, TSH, urine ALB. Recommend complete cessation of smoking.  Refills sent.  Return in 6 months.  Relevant Orders   Comprehensive metabolic panel   TSH     Respiratory   Acute non-recurrent frontal sinusitis   Acute and ongoing for about 2 weeks.  Will start Augmentin BID for 7 days, instructed him on this.  Recommend: - Increased rest - Increasing Fluids - Acetaminophen as needed for fever/pain.  - Salt water gargling, chloraseptic spray and throat lozenges - OTC Coricidin or Claritin - Mucinex.  - Saline sinus flushes or a neti pot.  - Humidifying the air.       Relevant Medications   amoxicillin-clavulanate (AUGMENTIN) 875-125 MG tablet   Centrilobular  emphysema (HCC) - Primary   Chronic, ongoing.  Continue to recommend complete smoking cessation. Continue Anoro daily as is offering benefit and lungs clearer on exam.  March 2024 FEV1 59% and FEV1/FVC 97%.  Educated him at length on emphysema and progressive nature of disease.   Continue annual lung screening.  Spirometry next visit.      Relevant Orders   CBC with Differential/Platelet   TSH     Other   Elevated hemoglobin A1c measurement   A1c 6.4% last check, recheck today.  Recommend he continue heavy focus on diet and regular exercise.      Relevant Orders   Microalbumin, Urine Waived   HgB A1c   Elevated PSA measurement   Ongoing, followed by urology, continue this collaboration.  Recent notes reviewed.      Hyperlipidemia   Chronic, ongoing.  Continue current medication regimen and adjust as needed.  Lipid panel today.   Return in 6 months.      Relevant Orders   Comprehensive metabolic panel   Lipid Panel w/o Chol/HDL Ratio   Nicotine dependence, cigarettes, uncomplicated   Did not tolerate Wellbutrin.  I have recommended complete cessation of tobacco use. I have discussed various options available for assistance with tobacco cessation including over the counter methods (Nicotine gum, patch and lozenges). We also discussed prescription options (Chantix, Nicotine Inhaler / Nasal Spray). The patient is not interested in pursuing any prescription tobacco cessation options at this time. Continue annual screening until age 68.       Vitamin D deficiency   Chronic, recommend he take Vitamin D3 2000 units daily.  Recheck today.      Other Visit Diagnoses       Encounter for annual physical exam       Annual physical today with health maintenance reviewed.  Refuses all vaccines, offer education at each visit.       Discussed aspirin prophylaxis for myocardial infarction prevention and decision was made to start ASA  LABORATORY TESTING:  Health maintenance labs ordered  today as discussed above.   IMMUNIZATIONS:   - Tdap: Tetanus vaccination status reviewed: refuses. - Influenza: Refused - Pneumovax: Refused - Prevnar: Refused - Zostavax vaccine: Refused  SCREENING: - Colonoscopy: Up to date  Discussed with patient purpose of the colonoscopy is to detect colon cancer at curable precancerous or early stages   - AAA Screening: Refused  -Hearing Test: Not applicable  -Spirometry: Up To Date  PATIENT COUNSELING:    Sexuality: Discussed sexually transmitted diseases, partner selection, use of condoms, avoidance of unintended pregnancy  and contraceptive alternatives.   Advised to avoid cigarette smoking.  I discussed with the patient that most people either abstain from alcohol or drink within safe limits (<=14/week and <=4 drinks/occasion for males, <=7/weeks and <= 3 drinks/occasion for females) and that the risk for alcohol disorders and other health  effects rises proportionally with the number of drinks per week and how often a drinker exceeds daily limits.  Discussed cessation/primary prevention of drug use and availability of treatment for abuse.   Diet: Encouraged to adjust caloric intake to maintain  or achieve ideal body weight, to reduce intake of dietary saturated fat and total fat, to limit sodium intake by avoiding high sodium foods and not adding table salt, and to maintain adequate dietary potassium and calcium preferably from fresh fruits, vegetables, and low-fat dairy products.    Stressed the importance of regular exercise  Injury prevention: Discussed safety belts, safety helmets, smoke detector, smoking near bedding or upholstery.   Dental health: Discussed importance of regular tooth brushing, flossing, and dental visits.   Follow up plan: NEXT PREVENTATIVE PHYSICAL DUE IN 1 YEAR. Return in about 6 months (around 02/10/2024) for HTN/HLD, Prediabetes, COPD.

## 2023-08-10 NOTE — Assessment & Plan Note (Signed)
Chronic and stable with no recent CP.  Continue current medications and collaboration with cardiology as needed.

## 2023-08-10 NOTE — Assessment & Plan Note (Signed)
Did not tolerate Wellbutrin.  I have recommended complete cessation of tobacco use. I have discussed various options available for assistance with tobacco cessation including over the counter methods (Nicotine gum, patch and lozenges). We also discussed prescription options (Chantix, Nicotine Inhaler / Nasal Spray). The patient is not interested in pursuing any prescription tobacco cessation options at this time. Continue annual screening until age 78.

## 2023-08-10 NOTE — Assessment & Plan Note (Signed)
Chronic, ongoing.  Continue current medication regimen and adjust as needed.  Lipid panel today.  Return in 6 months. 

## 2023-08-10 NOTE — Assessment & Plan Note (Signed)
 Acute and ongoing for about 2 weeks.  Will start Augmentin BID for 7 days, instructed him on this.  Recommend: - Increased rest - Increasing Fluids - Acetaminophen as needed for fever/pain.  - Salt water gargling, chloraseptic spray and throat lozenges - OTC Coricidin or Claritin - Mucinex.  - Saline sinus flushes or a neti pot.  - Humidifying the air.

## 2023-08-10 NOTE — Assessment & Plan Note (Signed)
Chronic.  Noted on lung CA CT screening, continue statin daily and recommend daily Baby ASA.  Recommend complete cessation smoking for prevention.

## 2023-08-10 NOTE — Assessment & Plan Note (Signed)
Chronic, stable.  BP at goal today.  Recommend he start checking BP at home three mornings a week and documenting for provider.  Discussed goal BP with him.  Continue current medication regimen and adjust as needed.  LABS: CMP, CBC, TSH, urine ALB. Recommend complete cessation of smoking.  Refills sent.  Return in 6 months.

## 2023-08-10 NOTE — Assessment & Plan Note (Signed)
 Chronic, ongoing.  Continue to recommend complete smoking cessation. Continue Anoro daily as is offering benefit and lungs clearer on exam.  March 2024 FEV1 59% and FEV1/FVC 97%.  Educated him at length on emphysema and progressive nature of disease.   Continue annual lung screening.  Spirometry next visit.

## 2023-08-10 NOTE — Assessment & Plan Note (Signed)
Chronic, recommend he take Vitamin D3 2000 units daily.  Recheck today. 

## 2023-08-10 NOTE — Assessment & Plan Note (Signed)
 A1c 6.4% last check, recheck today.  Recommend he continue heavy focus on diet and regular exercise.

## 2023-08-11 ENCOUNTER — Encounter: Payer: Self-pay | Admitting: Nurse Practitioner

## 2023-08-11 LAB — COMPREHENSIVE METABOLIC PANEL WITH GFR
ALT: 19 IU/L (ref 0–44)
AST: 18 IU/L (ref 0–40)
Albumin: 4.3 g/dL (ref 3.8–4.8)
Alkaline Phosphatase: 108 IU/L (ref 44–121)
BUN/Creatinine Ratio: 14 (ref 10–24)
BUN: 17 mg/dL (ref 8–27)
Bilirubin Total: 0.3 mg/dL (ref 0.0–1.2)
CO2: 24 mmol/L (ref 20–29)
Calcium: 9.7 mg/dL (ref 8.6–10.2)
Chloride: 99 mmol/L (ref 96–106)
Creatinine, Ser: 1.18 mg/dL (ref 0.76–1.27)
Globulin, Total: 3.1 g/dL (ref 1.5–4.5)
Glucose: 86 mg/dL (ref 70–99)
Potassium: 4.1 mmol/L (ref 3.5–5.2)
Sodium: 139 mmol/L (ref 134–144)
Total Protein: 7.4 g/dL (ref 6.0–8.5)
eGFR: 64 mL/min/{1.73_m2} (ref >59)

## 2023-08-11 LAB — CBC WITH DIFFERENTIAL/PLATELET
Basophils Absolute: 0.1 10*3/uL (ref 0.0–0.2)
Basos: 1 %
EOS (ABSOLUTE): 0.6 10*3/uL — ABNORMAL HIGH (ref 0.0–0.4)
Eos: 5 %
Hematocrit: 39 % (ref 37.5–51.0)
Hemoglobin: 13 g/dL (ref 13.0–17.7)
Immature Grans (Abs): 0 10*3/uL (ref 0.0–0.1)
Immature Granulocytes: 0 %
Lymphocytes Absolute: 3 10*3/uL (ref 0.7–3.1)
Lymphs: 28 %
MCH: 29.7 pg (ref 26.6–33.0)
MCHC: 33.3 g/dL (ref 31.5–35.7)
MCV: 89 fL (ref 79–97)
Monocytes Absolute: 0.8 10*3/uL (ref 0.1–0.9)
Monocytes: 8 %
Neutrophils Absolute: 6.2 10*3/uL (ref 1.4–7.0)
Neutrophils: 58 %
Platelets: 218 10*3/uL (ref 150–450)
RBC: 4.38 x10E6/uL (ref 4.14–5.80)
RDW: 12.8 % (ref 11.6–15.4)
WBC: 10.7 10*3/uL (ref 3.4–10.8)

## 2023-08-11 LAB — HEMOGLOBIN A1C
Est. average glucose Bld gHb Est-mCnc: 146 mg/dL
Hgb A1c MFr Bld: 6.7 % — ABNORMAL HIGH (ref 4.8–5.6)

## 2023-08-11 LAB — TSH: TSH: 1.37 u[IU]/mL (ref 0.450–4.500)

## 2023-08-11 LAB — LIPID PANEL W/O CHOL/HDL RATIO
Cholesterol, Total: 160 mg/dL (ref 100–199)
HDL: 32 mg/dL — ABNORMAL LOW (ref >39)
LDL Chol Calc (NIH): 76 mg/dL (ref 0–99)
Triglycerides: 325 mg/dL — ABNORMAL HIGH (ref 0–149)
VLDL Cholesterol Cal: 52 mg/dL — ABNORMAL HIGH (ref 5–40)

## 2023-08-11 NOTE — Progress Notes (Signed)
 Good day amazing crew, please let Brandon Hart know labs have returned: - Lipid panel is still showing LDL above goal and triglycerides elevated.  I recommend we increase your Atorvastatin to 40 MG daily and stop the 20 MG.  Would you be okay with this? - Kidney and liver function are normal. - Blood counts are stable. - Thyroid level is normal. - A1c has crept back up into diabetes range, there is some diabetes present.  I do not feel we need to start medication at this time, but do want to see you back in 3 months for follow-up visit and recheck of blood work.  If still remains elevated at that time we will need to start medication.  Focus heavily on healthy diet changes and regular exercise.  (Staff please schedule 3 month follow-up:))  Any questions?  Keep being amazing!!  Thank you for allowing me to participate in your care.  I appreciate you. Kindest regards, Nylen Creque

## 2023-08-14 ENCOUNTER — Ambulatory Visit: Payer: Self-pay | Admitting: *Deleted

## 2023-08-14 NOTE — Patient Outreach (Signed)
 Care Coordination   Follow Up Visit Note   08/14/2023 Name: Brandon Hart MRN: [id number] DOB: [birth year]  Brandon Hart is a 78 y.o. year old male who sees [place], Corrie Dandy T, NP for primary care. I spoke with  Brandon Hart by phone today.  What matters to the patients health and wellness today?  Patient report he is doing well, does not have any concerns about chronic conditions at this time, they are stable.  Denies any urgent concerns, encouraged to contact this care manager with questions.      Goals Addressed             This Visit's Progress    COMPLETED: Effective management of health conditions   On track    Interventions Today    Flowsheet Row Most Recent Value  Chronic Disease   Chronic disease during today's visit Hypertension (HTN), Chronic Obstructive Pulmonary Disease (COPD)  General Interventions   General Interventions Discussed/Reviewed General Interventions Reviewed, Labs, Doctor Visits  Labs Hgb A1c every 3 months  [recent A1C 6.7, need new PSA level]  Doctor Visits Discussed/Reviewed Doctor Visits Reviewed, PCP, Specialist  [Need to reschedule urology appt (missed on 3/4 and 3/11). Need to schedule 3 mont follow up with PCP for June, offered to call to schedule both, pt declined]  PCP/Specialist Visits Compliance with follow-up visit  [seen by [employer] 3/27]  Education Interventions   Education Provided Provided Education  Provided Verbal Education On When to see the doctor, Medication, Labs  [Meds reviewed, no major changes, report compliance. Received short dose of antibiotics for sinus infection.  Educated on importance of keeping and attending MD appointments.]  Labs Reviewed Hgb A1c              SDOH assessments and interventions completed:  No     Care Coordination Interventions:  Yes, provided   Follow up plan: No further intervention required.   Encounter Outcome:  Patient Visit Completed   Rodney Langton, RN,  MSN, CCM [place]  [facility], [facility] RN Care Coordinator Direct Dial: [phone] / Main [phone] Fax [phone] Email: [email or web address]: [email or web address]

## 2023-09-08 ENCOUNTER — Other Ambulatory Visit: Payer: Self-pay | Admitting: Nurse Practitioner

## 2023-09-08 NOTE — Telephone Encounter (Signed)
 Requested Prescriptions  Pending Prescriptions Disp Refills   lisinopril -hydrochlorothiazide  (ZESTORETIC ) 20-25 MG tablet [Pharmacy Med Name: LISINOPRIL -HCTZ 20-25 MG TAB] 90 tablet 1    Sig: TAKE 1 TABLET BY MOUTH EVERY DAY     Cardiovascular:  ACEI + Diuretic Combos Passed - 09/08/2023  1:55 PM      Passed - Na in normal range and within 180 days    Sodium  Date Value Ref Range Status  08/10/2023 139 134 - 144 mmol/L Final         Passed - K in normal range and within 180 days    Potassium  Date Value Ref Range Status  08/10/2023 4.1 3.5 - 5.2 mmol/L Final         Passed - Cr in normal range and within 180 days    Creatinine, Ser  Date Value Ref Range Status  08/10/2023 1.18 0.76 - 1.27 mg/dL Final         Passed - eGFR is 30 or above and within 180 days    GFR calc Af Amer  Date Value Ref Range Status  06/29/2020 84 >59 mL/min/1.73 Final    Comment:    **In accordance with recommendations from the NKF-ASN Task force,**   [facility] is in the process of updating its eGFR calculation to the   2021 CKD-EPI creatinine equation that estimates kidney function   without a race variable.    GFR, Estimated  Date Value Ref Range Status  12/04/2022 49 (L) >60 mL/min Final    Comment:    (NOTE) Calculated using the CKD-EPI Creatinine Equation (2021)    eGFR  Date Value Ref Range Status  08/10/2023 64 >59 mL/min/1.73 Final         Passed - Patient is not pregnant      Passed - Last BP in normal range    BP Readings from Last 1 Encounters:  08/10/23 128/80         Passed - Valid encounter within last 6 months    Recent Outpatient Visits           4 weeks ago Centrilobular emphysema (HCC)   [place] [facility] [place], [place] T, NP   1 month ago Gastroenteritis due to food toxin   [place] [facility] [place], Lavelle Posey, NP

## 2023-09-16 ENCOUNTER — Other Ambulatory Visit: Payer: Self-pay | Admitting: Nurse Practitioner

## 2023-09-19 NOTE — Telephone Encounter (Signed)
 Requested Prescriptions  Pending Prescriptions Disp Refills   amLODipine  (NORVASC ) 5 MG tablet [Pharmacy Med Name: AMLODIPINE  BESYLATE 5 MG TAB] 90 tablet 1    Sig: TAKE 1 TABLET (5 MG TOTAL) BY MOUTH DAILY.     Cardiovascular: Calcium  Channel Blockers 2 Passed - 09/19/2023 11:31 AM      Passed - Last BP in normal range    BP Readings from Last 1 Encounters:  08/10/23 128/80         Passed - Last Heart Rate in normal range    Pulse Readings from Last 1 Encounters:  08/10/23 85         Passed - Valid encounter within last 6 months    Recent Outpatient Visits           1 month ago Centrilobular emphysema (HCC)   [place] [facility] [place], [place] T, NP   2 months ago Gastroenteritis due to food toxin   [place] [facility] [place], Lavelle Posey, NP

## 2023-09-28 IMAGING — CT CT CHEST LUNG CANCER SCREENING LOW DOSE W/O CM
2 of 5 series · 15 of 40 positions shown, 18 images · non-contrast
Comparison: 07/15/2020.

CLINICAL DATA: Current smoker, 53 pack-year history.



[Series 3: lung 1.00 · axial · 0.74mm/px · z∈[-1256,-937]mm · 12 of 351 slices shown, 15 images]
[im 16/351  mediastinal]
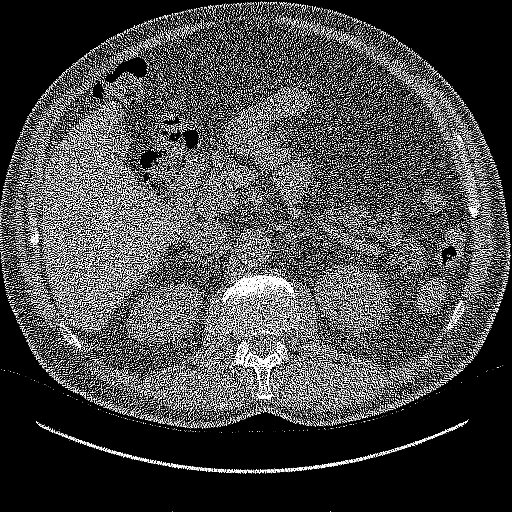
[im 16/351  lung]
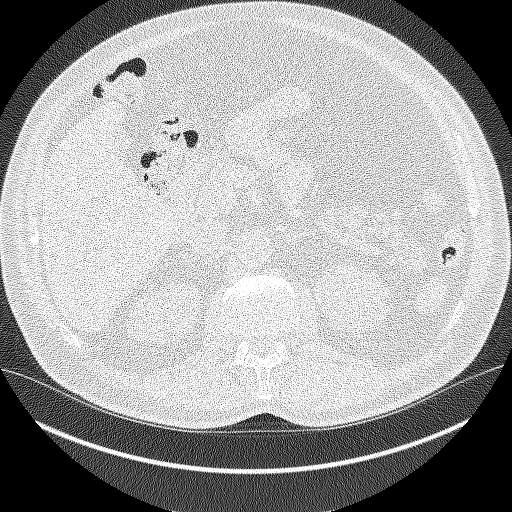
[im 48/351  lung]
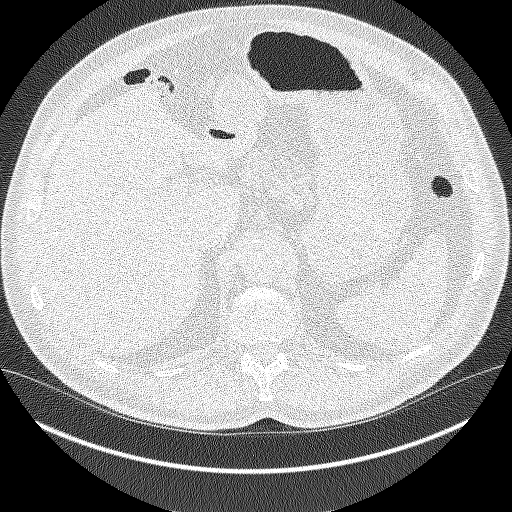
[im 80/351  lung]
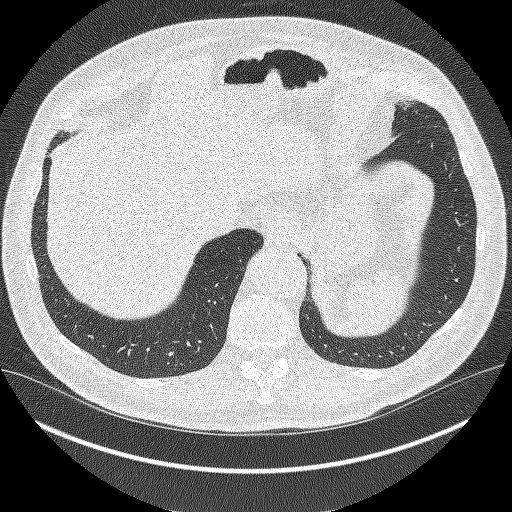
[im 112/351  lung]
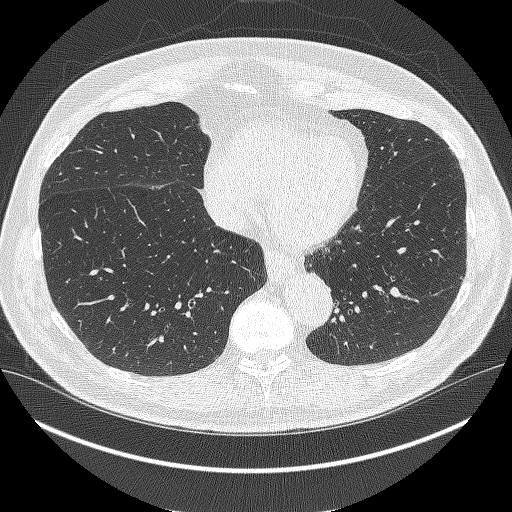
[im 128/351  mediastinal]
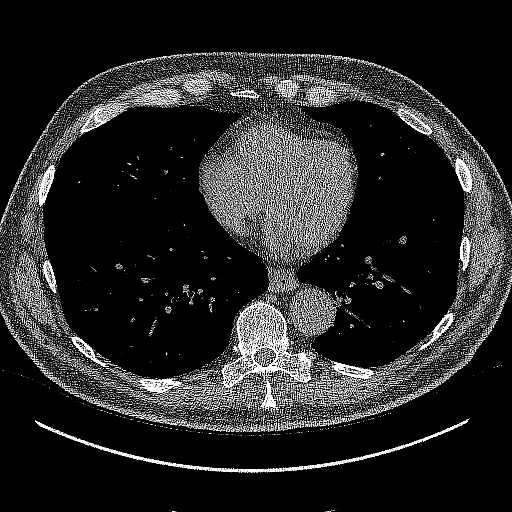
[im 128/351  lung]
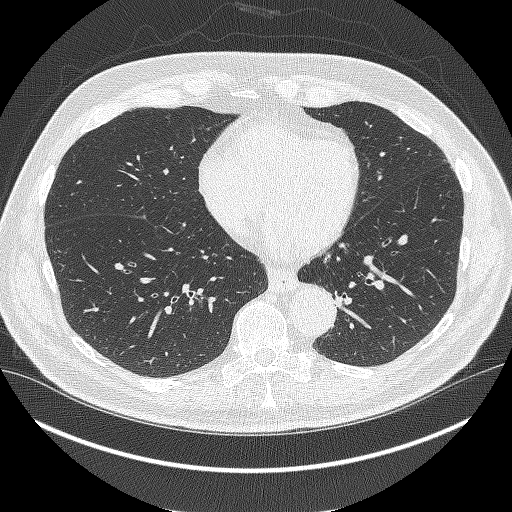
[im 160/351  lung]
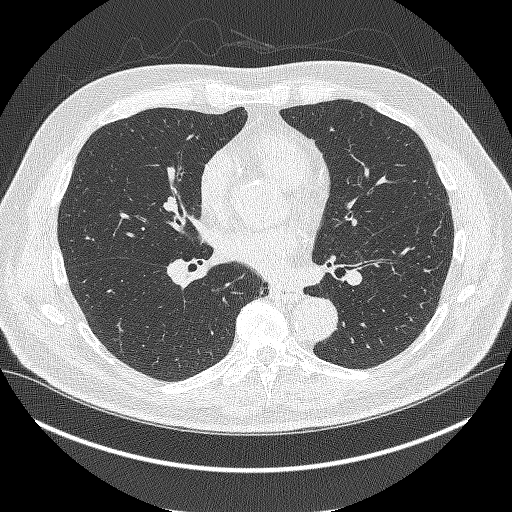
[im 191/351  lung]
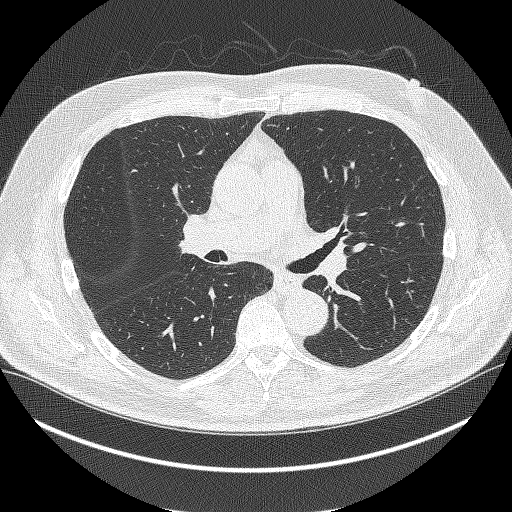
[im 223/351  lung]
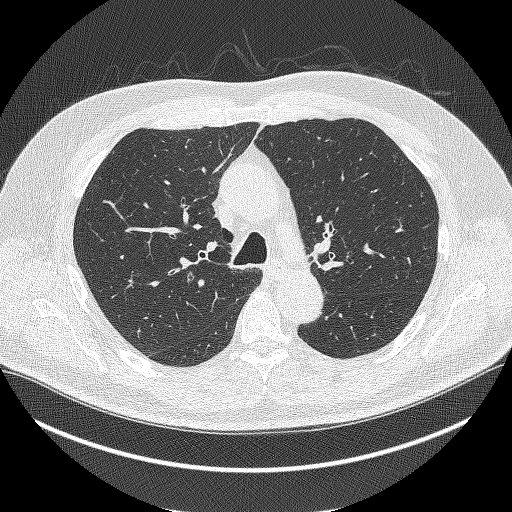
[im 239/351  mediastinal]
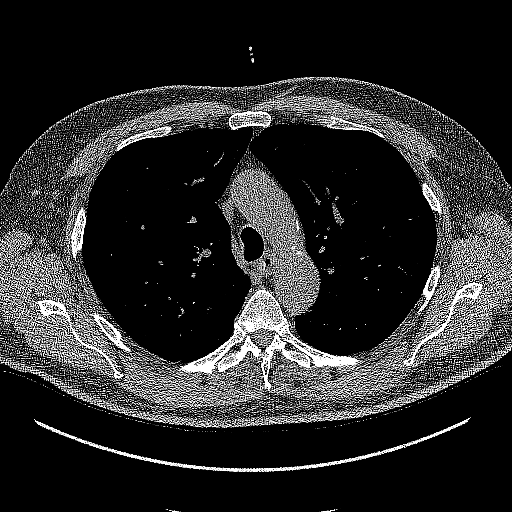
[im 239/351  lung]
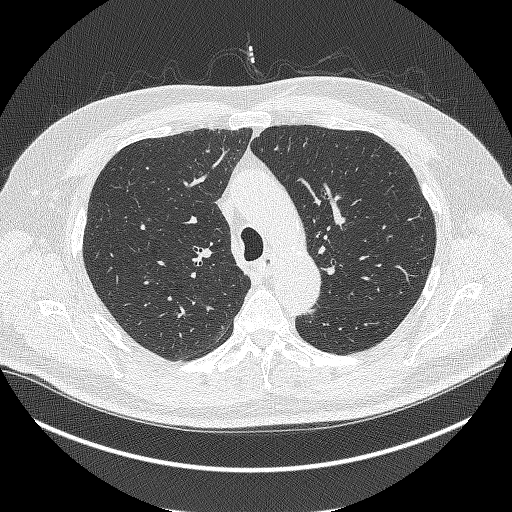
[im 271/351  lung]
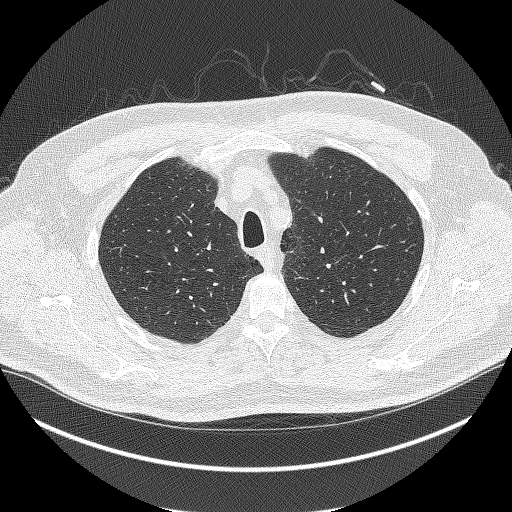
[im 303/351  lung]
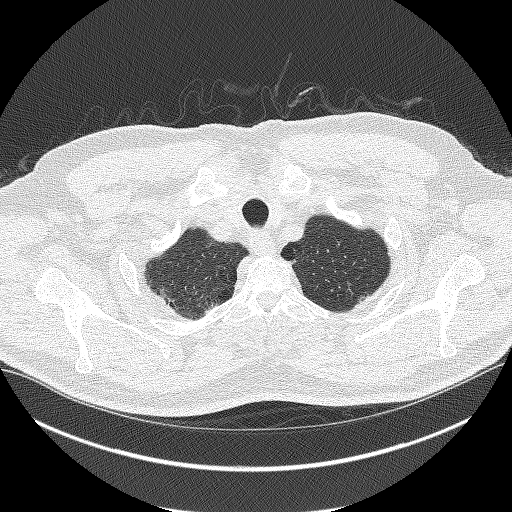
[im 335/351  lung]
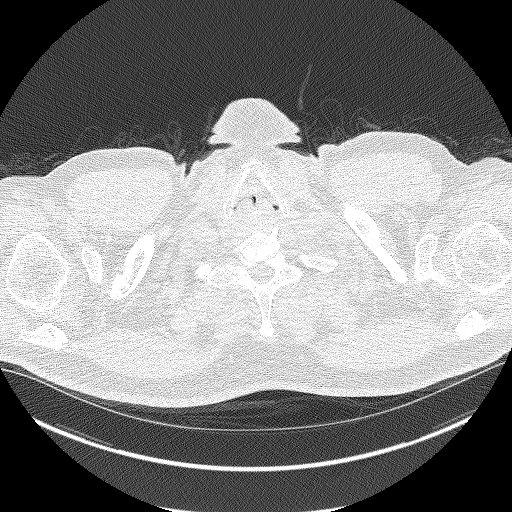

[Series 5: coronals lung 1.00 cor · coronal · 0.69mm/px · 3 of 304 slices shown]
[im 61/304  lung]
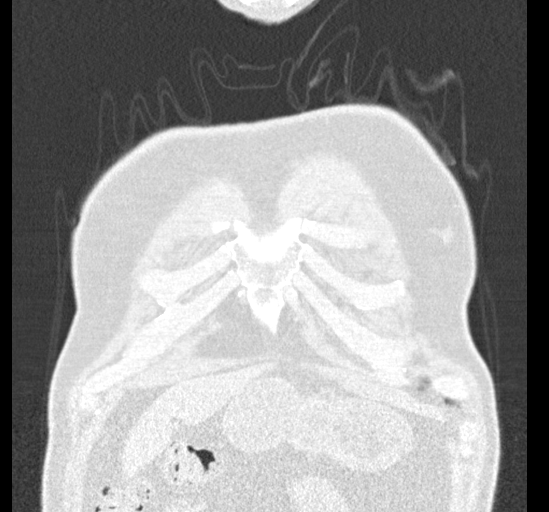
[im 122/304  lung]
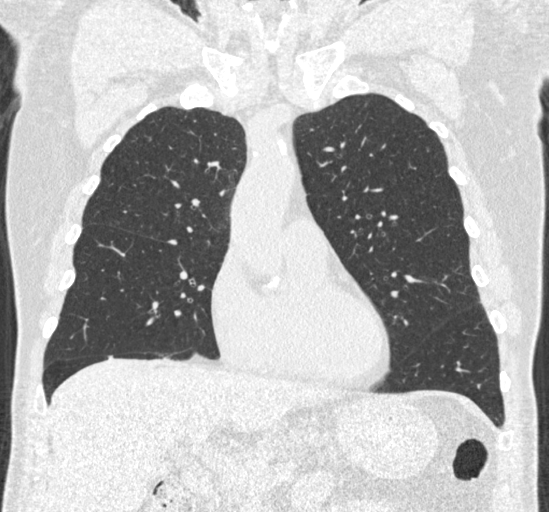
[im 182/304  lung]
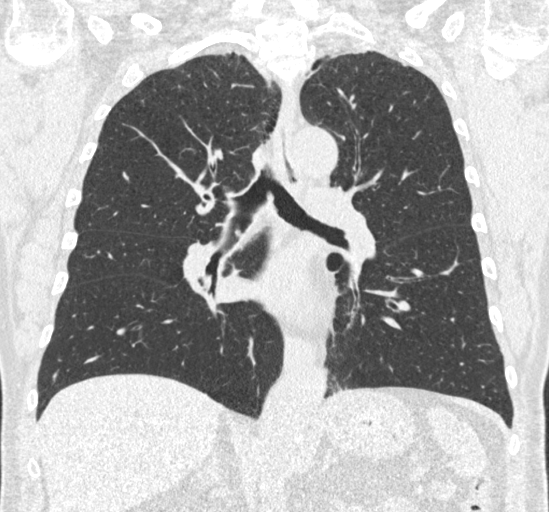

[15 of 40 positions shown; findings below may reference images not displayed]

FINDINGS: Cardiovascular: Atherosclerotic calcification of the aorta, aortic
valve and coronary arteries. Heart size normal. No pericardial
effusion.

Mediastinum/Nodes: No pathologically enlarged mediastinal or
axillary lymph nodes. Hilar regions are difficult to definitively
evaluate without IV contrast. Esophagus is grossly unremarkable.

Lungs/Pleura: Biapical pleuroparenchymal scarring. Centrilobular and
paraseptal emphysema. Smoking related respiratory bronchiolitis.
Calcified granuloma. Noncalcified pulmonary nodules measure 2.8 mm
or less in size, as before. No new or suspicious pulmonary nodules.
No pleural fluid. Airway is unremarkable.

Upper Abdomen: Visualized portions of the liver, gallbladder,
adrenal glands, kidneys, spleen, pancreas, stomach and bowel are
grossly unremarkable.

Musculoskeletal: Degenerative changes in the spine. Minimal
compression of the T3 superior endplate, unchanged. No worrisome
lytic or sclerotic lesions.
IMPRESSION: 1. Lung-RADS 2, benign appearance or behavior. Continue annual
screening with low-dose chest CT without contrast in 12 months.
2. Aortic atherosclerosis (L2NY3-MFL.L). Coronary artery
calcification.
3.  Emphysema (L2NY3-OOS.U).

## 2023-11-04 NOTE — Patient Instructions (Addendum)
 Get some Voltaren gel over the counter for neck pain and use as instructed.  Diabetes Mellitus and Exercise Regular exercise is important for your health, especially if you have diabetes mellitus. Exercise is not just about losing weight. It can also help you increase muscle strength and bone density and reduce body fat and stress. This can help your level of endurance and make you more fit and flexible. Why should I exercise if I have diabetes? Exercise has many benefits for people with diabetes. It can: Help lower and control your blood sugar (glucose). Help your body respond better and become more sensitive to the hormone insulin. Reduce how much insulin your body needs. Lower your risk for heart disease by: Lowering how much bad cholesterol and triglycerides you have in your body. Increasing how much good cholesterol you have in your body. Lowering your blood pressure. Lowering your blood glucose levels. What is my activity plan? Your health care provider or an expert trained in diabetes care (certified diabetes educator) can help you make an activity plan. This plan can help you find the type of exercise that works for you. It may also tell you how often to exercise and for how long. Be sure to: Get at least 150 minutes of medium-intensity or high-intensity exercise each week. This may involve brisk walking, biking, or water aerobics. Do stretching and strengthening exercises at least 2 times a week. This may involve yoga or weight lifting. Spread out your activity over at least 3 days of the week. Get some form of physical activity each day. Do not go more than 2 days in a row without some kind of activity. Avoid being inactive for more than 30 minutes at a time. Take frequent breaks to walk or stretch. Choose activities that you enjoy. Set goals that you know you can accomplish. Start slowly and increase the intensity of your exercise over time. How do I manage my diabetes during  exercise?  Monitor your blood glucose Check your blood glucose before and after you exercise. If your blood glucose is 240 mg/dL (86.6 mmol/L) or higher before you exercise, check your urine for ketones. These are chemicals created by the liver. If you have ketones in your urine, do not exercise until your blood glucose returns to normal. If your blood glucose is 100 mg/dL (5.6 mmol/L) or lower, eat a snack that has 15-20 grams of carbohydrate in it. Check your blood glucose 15 minutes after the snack to make sure that your level is above 100 mg/dL (5.6 mmol/L) before you start to exercise. Your risk for low blood glucose (hypoglycemia) goes up during and after exercise. Know the symptoms of this condition and how to treat it. Follow these instructions at home: Keep a carbohydrate snack on hand for use before, during, and after exercise. This can help prevent or treat hypoglycemia. Avoid injecting insulin into parts of your body that are going to be used during exercise. This may include: Your arms, when you are going to play tennis. Your legs, when you are about to go jogging. Keep track of your exercise habits. This can help you and your health care provider watch and adjust your activity plan. Write down: What you eat before and after you exercise. Blood glucose levels before and after you exercise. The type and amount of exercise you do. Talk to your health care provider before you start a new activity. They may need to: Make sure that the activity is safe for you. Adjust your  insulin, other medicines, and food that you eat. Drink water while you exercise. This can stop you from losing too much water (dehydration). It can also prevent problems caused by having a lot of heat in your body (heat stroke). Where to find more information American Diabetes Association: diabetes.org Association of Diabetes Care & Education Specialists: diabeteseducator.org This information is not intended to  replace advice given to you by your health care provider. Make sure you discuss any questions you have with your health care provider. Document Revised: 10/20/2021 Document Reviewed: 10/20/2021 Elsevier Patient Education  2024 [employer].

## 2023-11-10 ENCOUNTER — Encounter: Payer: Self-pay | Admitting: Nurse Practitioner

## 2023-11-10 ENCOUNTER — Ambulatory Visit (INDEPENDENT_AMBULATORY_CARE_PROVIDER_SITE_OTHER): Admitting: Nurse Practitioner

## 2023-11-10 VITALS — BP 122/75 | HR 76 | Temp 98.0°F | Ht 68.9 in | Wt 187.2 lb

## 2023-11-10 DIAGNOSIS — M542 Cervicalgia: Secondary | ICD-10-CM | POA: Insufficient documentation

## 2023-11-10 DIAGNOSIS — I700 Atherosclerosis of aorta: Secondary | ICD-10-CM

## 2023-11-10 DIAGNOSIS — I25118 Atherosclerotic heart disease of native coronary artery with other forms of angina pectoris: Secondary | ICD-10-CM

## 2023-11-10 DIAGNOSIS — E1169 Type 2 diabetes mellitus with other specified complication: Secondary | ICD-10-CM

## 2023-11-10 DIAGNOSIS — F17210 Nicotine dependence, cigarettes, uncomplicated: Secondary | ICD-10-CM

## 2023-11-10 DIAGNOSIS — E1159 Type 2 diabetes mellitus with other circulatory complications: Secondary | ICD-10-CM | POA: Diagnosis not present

## 2023-11-10 DIAGNOSIS — E119 Type 2 diabetes mellitus without complications: Secondary | ICD-10-CM | POA: Diagnosis not present

## 2023-11-10 DIAGNOSIS — J432 Centrilobular emphysema: Secondary | ICD-10-CM | POA: Diagnosis not present

## 2023-11-10 DIAGNOSIS — E785 Hyperlipidemia, unspecified: Secondary | ICD-10-CM | POA: Diagnosis not present

## 2023-11-10 DIAGNOSIS — R972 Elevated prostate specific antigen [PSA]: Secondary | ICD-10-CM | POA: Diagnosis not present

## 2023-11-10 DIAGNOSIS — I152 Hypertension secondary to endocrine disorders: Secondary | ICD-10-CM

## 2023-11-10 LAB — BAYER DCA HB A1C WAIVED: HB A1C (BAYER DCA - WAIVED): 6.3 % — ABNORMAL HIGH (ref 4.8–5.6)

## 2023-11-10 MED ORDER — LISINOPRIL-HYDROCHLOROTHIAZIDE 20-25 MG PO TABS: 1.0000 | ORAL_TABLET | Freq: Every day | ORAL | 3 refills | Status: AC

## 2023-11-10 NOTE — Assessment & Plan Note (Signed)
 Acute for 3 weeks and slightly improving.  Suspect more muscular in nature.  Recommend he continue Icy/Hot patches at home and add on ThermaCare patches + Voltaren gel.  Gentle stretching daily.  May take Tylenol as needed.  If ongoing or worsening then return to office.

## 2023-11-10 NOTE — Assessment & Plan Note (Signed)
 Chronic, ongoing.  Continue to recommend complete smoking cessation. Continue Anoro daily as is offering benefit and lungs clearer on exam.  March 2024 FEV1 59% and FEV1/FVC 97%.  Educated him at length on emphysema and progressive nature of disease.   Continue annual lung screening.  Spirometry next visit.

## 2023-11-10 NOTE — Progress Notes (Addendum)
 BP 122/75 (BP Location: Left Arm, Patient Position: Sitting, Cuff Size: Normal)   Pulse 76   Temp 98 F (36.7 C) (Oral)   Ht 5' 8.9 (1.75 m)   Wt 187 lb 3.2 oz (84.9 kg)   SpO2 96%   BMI 27.72 kg/m    Subjective:    Patient ID: Brandon Hart, male    DOB: 1946-04-17, 78 y.o.   MRN: [id number]  HPI: Brandon Hart is a 78 y.o. male  Chief Complaint  Patient presents with   Hypertension   Neck Pain    Patient complains of dull, achy pain on the right side of neck. Onset about 3 weeks ago. Hurts when turning neck   Diabetes   DIABETES A1c in March 6.7%.  Current diet control. Hypoglycemic episodes:no Polydipsia/polyuria: no Visual disturbance: no Chest pain: no Paresthesias: no Glucose Monitoring: no  Accucheck frequency: Not Checking  Fasting glucose:  Post prandial:  Evening:  Before meals: Taking Insulin?: no  Long acting insulin:  Short acting insulin: Blood Pressure Monitoring: not checking Retinal Examination: Not up to Date Foot Exam: Up to Date Diabetic Education: Not Completed Pneumovax: refuses Influenza: refuses Aspirin: no   HYPERTENSION / HYPERLIPIDEMIA Takes Amlodipine , Lisinopril -HCTZ, and Atorvastatin . Satisfied with current treatment? yes Duration of hypertension: chronic BP monitoring frequency: not checking BP range:  BP medication side effects: no Past BP meds:  Duration of hyperlipidemia: chronic Cholesterol medication side effects: no Cholesterol supplements: none Medication compliance: good compliance Aspirin: no Recent stressors: no Recurrent headaches: no Visual changes: no Palpitations: no Dyspnea: no Chest pain: no Lower extremity edema: no Dizzy/lightheaded: no   ELEVATED PSA Saw urology last on 04/19/23, watchful waiting. Nocturia: 1/night Urinary frequency:no Incomplete voiding: no Urgency: no Weak urinary stream: no Straining to start stream: no Dysuria: no Onset: gradual Severity:  mild Alleviating factors: stable Aggravating factors: unknown  COPD Continues to smoke 1/2 PPD. COPD status: stable Satisfied with current treatment?: yes Oxygen use: no Dyspnea frequency: none Cough frequency: none Rescue inhaler frequency:  no use Limitation of activity: no Productive cough: no Last Spirometry: 07/21/2022 Pneumovax: refuses Influenza: refuses   NECK PAIN Started about 3 weeks, no injuries at the time.  Woke up with it one day, at baseline sleeps on two pillows. Treatments attempted: Icy/Hot patches Relief with NSAIDs?:  No NSAIDs Taken Location:Right Duration:weeks Severity: 5/10 Quality: dull and aching Frequency: constant Radiation: none Aggravating factors: none Alleviating factors: Icy/Hot patches Weakness:  no Paresthesias / decreased sensation:  no  Fevers:  no   Relevant past medical, surgical, family and social history reviewed and updated as indicated. Interim medical history since our last visit reviewed. Allergies and medications reviewed and updated.  Review of Systems  Constitutional:  Negative for activity change, diaphoresis, fatigue and fever.  Respiratory:  Negative for cough, chest tightness, shortness of breath and wheezing.   Cardiovascular:  Negative for chest pain, palpitations and leg swelling.  Gastrointestinal: Negative.   Endocrine: Negative for polydipsia, polyphagia and polyuria.  Genitourinary: Negative.   Musculoskeletal:  Positive for neck pain.  Neurological: Negative.   Psychiatric/Behavioral: Negative.      Per HPI unless specifically indicated above     Objective:    BP 122/75 (BP Location: Left Arm, Patient Position: Sitting, Cuff Size: Normal)   Pulse 76   Temp 98 F (36.7 C) (Oral)   Ht 5' 8.9 (1.75 m)   Wt 187 lb 3.2 oz (84.9 kg)   SpO2 96%   BMI  27.72 kg/m   Wt Readings from Last 3 Encounters:  11/10/23 187 lb 3.2 oz (84.9 kg)  08/10/23 191 lb 3.2 oz (86.7 kg)  07/31/23 193 lb (87.5 kg)     Physical Exam Vitals and nursing note reviewed.  Constitutional:      General: He is awake. He is not in acute distress.    Appearance: Normal appearance. He is well-developed and well-groomed. He is not ill-appearing or toxic-appearing.  HENT:     Head: Normocephalic.     Right Ear: Hearing and external ear normal.     Left Ear: Hearing and external ear normal.   Eyes:     General: Lids are normal.     Extraocular Movements: Extraocular movements intact.     Conjunctiva/sclera: Conjunctivae normal.   Neck:     Thyroid : No thyromegaly.     Vascular: No carotid bruit.   Cardiovascular:     Rate and Rhythm: Normal rate and regular rhythm.     Heart sounds: Normal heart sounds. No murmur heard.    No gallop.  Pulmonary:     Effort: No accessory muscle usage or respiratory distress.     Breath sounds: Normal breath sounds. No decreased breath sounds, wheezing or rales.  Abdominal:     General: Bowel sounds are normal. There is no distension.     Palpations: Abdomen is soft.     Tenderness: There is no abdominal tenderness.   Musculoskeletal:     Cervical back: Normal range of motion. No edema, rigidity or crepitus. Pain with movement (with extension and lateral left) present. No spinous process tenderness or muscular tenderness. Normal range of motion.     Right lower leg: No edema.     Left lower leg: No edema.  Lymphadenopathy:     Cervical: No cervical adenopathy.   Skin:    General: Skin is warm.     Capillary Refill: Capillary refill takes less than 2 seconds.   Neurological:     Mental Status: He is alert and oriented to person, place, and time.     Deep Tendon Reflexes: Reflexes are normal and symmetric.     Reflex Scores:      Brachioradialis reflexes are 2+ on the right side and 2+ on the left side.      Patellar reflexes are 2+ on the right side and 2+ on the left side.  Psychiatric:        Attention and Perception: Attention normal.        Mood and Affect:  Mood normal.        Speech: Speech normal.        Behavior: Behavior normal. Behavior is cooperative.        Thought Content: Thought content normal.    Diabetic Foot Exam - Simple   Simple Foot Form Visual Inspection See comments: Yes Sensation Testing Intact to touch and monofilament testing bilaterally: Yes Pulse Check Posterior Tibialis and Dorsalis pulse intact bilaterally: Yes Comments Thickened toenails and dry feet.      Results for orders placed or performed in visit on 11/10/23  Bayer DCA Hb A1c Waived   Collection Time: 11/10/23  9:12 AM  Result Value Ref Range   HB A1C (BAYER DCA - WAIVED) 6.3 (H) 4.8 - 5.6 %      Assessment & Plan:   Problem List Items Addressed This Visit       Cardiovascular and Mediastinum   Hypertension associated with diabetes (HCC)   Chronic,  stable.  BP at goal today.  Recommend he start checking BP at home three mornings a week and documenting for provider.  Discussed goal BP with him.  Continue current medication regimen and adjust as needed.  LABS: CMP. Recommend complete cessation of smoking.  Refills sent.  Return in 6 months.      Relevant Medications   lisinopril -hydrochlorothiazide  (ZESTORETIC ) 20-25 MG tablet   Other Relevant Orders   Bayer DCA Hb A1c Waived (Completed)   Comprehensive metabolic panel with GFR   Coronary artery disease of native artery of native heart with stable angina pectoris (HCC)   Chronic and stable with no recent CP.  Continue current medications and collaboration with cardiology as needed.      Relevant Medications   lisinopril -hydrochlorothiazide  (ZESTORETIC ) 20-25 MG tablet   Other Relevant Orders   Comprehensive metabolic panel with GFR   Lipid Panel w/o Chol/HDL Ratio   Aortic atherosclerosis (HCC)   Chronic.  Noted on lung CA CT screening, continue statin daily and recommend daily Baby ASA.  Recommend complete cessation smoking for prevention.      Relevant Medications    lisinopril -hydrochlorothiazide  (ZESTORETIC ) 20-25 MG tablet   Other Relevant Orders   Comprehensive metabolic panel with GFR   Lipid Panel w/o Chol/HDL Ratio     Respiratory   Centrilobular emphysema (HCC)   Chronic, ongoing.  Continue to recommend complete smoking cessation. Continue Anoro daily as is offering benefit and lungs clearer on exam.  March 2024 FEV1 59% and FEV1/FVC 97%.  Educated him at length on emphysema and progressive nature of disease.   Continue annual lung screening.  Spirometry next visit.        Endocrine   Type 2 diabetes, diet controlled (HCC) - Primary   Chronic, stable with diet control.  A1c 6.3% today.  Continue diet focus.  Recommend he check blood sugar a few days a week at home and ensure healthy diet and regular exercise.   - Refuses Flu and PCV20 - Needs eye exam, foot up to date - ACE and statin on board.      Relevant Medications   lisinopril -hydrochlorothiazide  (ZESTORETIC ) 20-25 MG tablet   Other Relevant Orders   Bayer DCA Hb A1c Waived (Completed)   Hyperlipidemia associated with type 2 diabetes mellitus (HCC)   Chronic, ongoing.  Continue current medication regimen and adjust as needed.  Lipid panel today.   Return in 6 months.      Relevant Medications   lisinopril -hydrochlorothiazide  (ZESTORETIC ) 20-25 MG tablet   Other Relevant Orders   Bayer DCA Hb A1c Waived (Completed)   Comprehensive metabolic panel with GFR   Lipid Panel w/o Chol/HDL Ratio     Other   Nicotine dependence, cigarettes, uncomplicated   Did not tolerate Wellbutrin .  I have recommended complete cessation of tobacco use. I have discussed various options available for assistance with tobacco cessation including over the counter methods (Nicotine gum, patch and lozenges). We also discussed prescription options (Chantix, Nicotine Inhaler / Nasal Spray). The patient is not interested in pursuing any prescription tobacco cessation options at this time. Continue annual  screening until age 89.       Neck pain   Acute for 3 weeks and slightly improving.  Suspect more muscular in nature.  Recommend he continue Icy/Hot patches at home and add on ThermaCare patches + Voltaren gel.  Gentle stretching daily.  May take Tylenol as needed.  If ongoing or worsening then return to office.  Elevated PSA measurement   Ongoing, followed by urology, continue this collaboration.  Recent notes reviewed.        Follow up plan: Return in about 6 months (around 05/11/2024) for T2DM, HTN/HLD, COPD.

## 2023-11-10 NOTE — Assessment & Plan Note (Signed)
Chronic.  Noted on lung CA CT screening, continue statin daily and recommend daily Baby ASA.  Recommend complete cessation smoking for prevention.

## 2023-11-10 NOTE — Assessment & Plan Note (Signed)
Did not tolerate Wellbutrin.  I have recommended complete cessation of tobacco use. I have discussed various options available for assistance with tobacco cessation including over the counter methods (Nicotine gum, patch and lozenges). We also discussed prescription options (Chantix, Nicotine Inhaler / Nasal Spray). The patient is not interested in pursuing any prescription tobacco cessation options at this time. Continue annual screening until age 78.

## 2023-11-10 NOTE — Assessment & Plan Note (Signed)
 Ongoing, followed by urology, continue this collaboration.  Recent notes reviewed.

## 2023-11-10 NOTE — Assessment & Plan Note (Signed)
Chronic and stable with no recent CP.  Continue current medications and collaboration with cardiology as needed.

## 2023-11-10 NOTE — Assessment & Plan Note (Signed)
 Chronic, stable with diet control.  A1c 6.3% today.  Continue diet focus.  Recommend he check blood sugar a few days a week at home and ensure healthy diet and regular exercise.   - Refuses Flu and PCV20 - Needs eye exam, foot up to date - ACE and statin on board.

## 2023-11-10 NOTE — Assessment & Plan Note (Signed)
Chronic, ongoing.  Continue current medication regimen and adjust as needed.  Lipid panel today.  Return in 6 months. 

## 2023-11-10 NOTE — Assessment & Plan Note (Signed)
 Chronic, stable.  BP at goal today.  Recommend he start checking BP at home three mornings a week and documenting for provider.  Discussed goal BP with him.  Continue current medication regimen and adjust as needed.  LABS: CMP. Recommend complete cessation of smoking.  Refills sent.  Return in 6 months.

## 2023-11-11 ENCOUNTER — Ambulatory Visit: Payer: Self-pay | Admitting: Nurse Practitioner

## 2023-11-11 LAB — COMPREHENSIVE METABOLIC PANEL WITH GFR
ALT: 20 IU/L (ref 0–44)
AST: 16 IU/L (ref 0–40)
Albumin: 4.3 g/dL (ref 3.8–4.8)
Alkaline Phosphatase: 113 IU/L (ref 44–121)
BUN/Creatinine Ratio: 12 (ref 10–24)
BUN: 16 mg/dL (ref 8–27)
Bilirubin Total: 0.3 mg/dL (ref 0.0–1.2)
CO2: 24 mmol/L (ref 20–29)
Calcium: 9.2 mg/dL (ref 8.6–10.2)
Chloride: 100 mmol/L (ref 96–106)
Creatinine, Ser: 1.3 mg/dL — ABNORMAL HIGH (ref 0.76–1.27)
Globulin, Total: 2.6 g/dL (ref 1.5–4.5)
Glucose: 107 mg/dL — ABNORMAL HIGH (ref 70–99)
Potassium: 4 mmol/L (ref 3.5–5.2)
Sodium: 138 mmol/L (ref 134–144)
Total Protein: 6.9 g/dL (ref 6.0–8.5)
eGFR: 57 mL/min/{1.73_m2} — ABNORMAL LOW (ref >59)

## 2023-11-11 LAB — LIPID PANEL W/O CHOL/HDL RATIO
Cholesterol, Total: 118 mg/dL (ref 100–199)
HDL: 28 mg/dL — ABNORMAL LOW (ref >39)
LDL Chol Calc (NIH): 58 mg/dL (ref 0–99)
Triglycerides: 194 mg/dL — ABNORMAL HIGH (ref 0–149)
VLDL Cholesterol Cal: 32 mg/dL (ref 5–40)

## 2023-12-25 ENCOUNTER — Other Ambulatory Visit: Payer: Self-pay | Admitting: Nurse Practitioner

## 2023-12-28 NOTE — Telephone Encounter (Signed)
 Requested Prescriptions  Pending Prescriptions Disp Refills   atorvastatin  (LIPITOR) 20 MG tablet [Pharmacy Med Name: ATORVASTATIN  20 MG TABLET] 90 tablet 3    Sig: TAKE 1 TABLET BY MOUTH EVERY DAY     Cardiovascular:  Antilipid - Statins Failed - 12/28/2023 11:05 AM      Failed - Lipid Panel in normal range within the last 12 months    Cholesterol, Total  Date Value Ref Range Status  11/10/2023 118 100 - 199 mg/dL Final   Cholesterol Piccolo, Waived  Date Value Ref Range Status  04/30/2015 194 <200 mg/dL Final    Comment:                            Desirable                <200                         Borderline High      200- 239                         High                     >239    LDL Chol Calc (NIH)  Date Value Ref Range Status  11/10/2023 58 0 - 99 mg/dL Final   HDL  Date Value Ref Range Status  11/10/2023 28 (L) >39 mg/dL Final   Triglycerides  Date Value Ref Range Status  11/10/2023 194 (H) 0 - 149 mg/dL Final   Triglycerides Piccolo,Waived  Date Value Ref Range Status  04/30/2015 182 (H) <150 mg/dL Final    Comment:                            Normal                   <150                         Borderline High     150 - 199                         High                200 - 499                         Very High                >499          Passed - Patient is not pregnant      Passed - Valid encounter within last 12 months    Recent Outpatient Visits           1 month ago Type 2 diabetes, diet controlled (HCC)   [place] [facility] [place], [place] T, NP   4 months ago Centrilobular emphysema (HCC)   [place] [facility] [place], [place] T, NP   5 months ago Gastroenteritis due to food toxin   [place] [facility] [place], Melanie DASEN, NP

## 2024-03-16 ENCOUNTER — Other Ambulatory Visit: Payer: Self-pay | Admitting: Nurse Practitioner

## 2024-03-18 NOTE — Telephone Encounter (Signed)
 Requested Prescriptions  Pending Prescriptions Disp Refills   amLODipine  (NORVASC ) 5 MG tablet [Pharmacy Med Name: AMLODIPINE  BESYLATE 5 MG TAB] 90 tablet 0    Sig: TAKE 1 TABLET (5 MG TOTAL) BY MOUTH DAILY.     Cardiovascular: Calcium  Channel Blockers 2 Passed - 03/18/2024  3:26 PM      Passed - Last BP in normal range    BP Readings from Last 1 Encounters:  11/10/23 122/75         Passed - Last Heart Rate in normal range    Pulse Readings from Last 1 Encounters:  11/10/23 76         Passed - Valid encounter within last 6 months    Recent Outpatient Visits           4 months ago Type 2 diabetes, diet controlled (HCC)   [place] [facility] [place], [place] T, NP   7 months ago Centrilobular emphysema (HCC)   [place] [facility] [place], Melanie T, NP   8 months ago Gastroenteritis due to food toxin   [place] [facility] [place], Melanie DASEN, NP

## 2024-05-11 NOTE — Patient Instructions (Incomplete)

## 2024-05-13 ENCOUNTER — Ambulatory Visit: Admitting: Nurse Practitioner

## 2024-05-13 ENCOUNTER — Encounter: Payer: Self-pay | Admitting: Nurse Practitioner

## 2024-05-13 VITALS — BP 129/75 | HR 79 | Temp 97.7°F | Resp 18 | Ht 68.9 in | Wt 193.0 lb

## 2024-05-13 DIAGNOSIS — R972 Elevated prostate specific antigen [PSA]: Secondary | ICD-10-CM

## 2024-05-13 DIAGNOSIS — E1159 Type 2 diabetes mellitus with other circulatory complications: Secondary | ICD-10-CM

## 2024-05-13 DIAGNOSIS — I25118 Atherosclerotic heart disease of native coronary artery with other forms of angina pectoris: Secondary | ICD-10-CM | POA: Diagnosis not present

## 2024-05-13 DIAGNOSIS — B3742 Candidal balanitis: Secondary | ICD-10-CM | POA: Insufficient documentation

## 2024-05-13 DIAGNOSIS — J432 Centrilobular emphysema: Secondary | ICD-10-CM | POA: Diagnosis not present

## 2024-05-13 DIAGNOSIS — E785 Hyperlipidemia, unspecified: Secondary | ICD-10-CM | POA: Diagnosis not present

## 2024-05-13 DIAGNOSIS — F17210 Nicotine dependence, cigarettes, uncomplicated: Secondary | ICD-10-CM

## 2024-05-13 DIAGNOSIS — E1169 Type 2 diabetes mellitus with other specified complication: Secondary | ICD-10-CM

## 2024-05-13 DIAGNOSIS — E119 Type 2 diabetes mellitus without complications: Secondary | ICD-10-CM

## 2024-05-13 DIAGNOSIS — I152 Hypertension secondary to endocrine disorders: Secondary | ICD-10-CM

## 2024-05-13 LAB — BAYER DCA HB A1C WAIVED: HB A1C (BAYER DCA - WAIVED): 6 % — ABNORMAL HIGH (ref 4.8–5.6)

## 2024-05-13 MED ORDER — FLUCONAZOLE 150 MG PO TABS: ORAL_TABLET | ORAL | 0 refills | Status: AC

## 2024-05-13 MED ORDER — TOLNAFTATE 1 % EX AERP: INHALATION_SPRAY | Freq: Two times a day (BID) | CUTANEOUS | 1 refills | Status: DC

## 2024-05-13 MED ORDER — AMLODIPINE BESYLATE 5 MG PO TABS: 5.0000 mg | ORAL_TABLET | Freq: Every day | ORAL | 3 refills | Status: AC

## 2024-05-13 NOTE — Assessment & Plan Note (Signed)
Chronic, ongoing.  Continue current medication regimen and adjust as needed.  Lipid panel today.  Return in 6 months. 

## 2024-05-13 NOTE — Assessment & Plan Note (Signed)
Chronic and stable with no recent CP.  Continue current medications and collaboration with cardiology as needed.

## 2024-05-13 NOTE — Progress Notes (Signed)
 "  BP 129/75 (BP Location: Left Arm, Patient Position: Sitting, Cuff Size: Normal)   Pulse 79   Temp 97.7 F (36.5 C) (Oral)   Resp 18   Ht 5' 8.9 (1.75 m)   Wt 193 lb (87.5 kg)   SpO2 94%   BMI 28.59 kg/m    Subjective:    Patient ID: Brandon Hart, male    DOB: [birth year], 78 y.o.   MRN: [id number]  HPI: Brandon Hart is a 78 y.o. male  Chief Complaint  Patient presents with   Follow-up    Here for follow up appointment from 6 months ago, no major concerns   DIABETES Currently diet controlled with last A1c 6.3% in June 2025. Hypoglycemic episodes:no Polydipsia/polyuria: no Visual disturbance: no Chest pain: no Paresthesias: no Glucose Monitoring: no  Accucheck frequency: Not Checking  Fasting glucose:  Post prandial:  Evening:  Before meals: Taking Insulin?: no  Long acting insulin:  Short acting insulin: Blood Pressure Monitoring: not checking Retinal Examination: Not up to Date Foot Exam: Up to Date Diabetic Education: Not Completed Pneumovax: Refuses Influenza: Refuses Aspirin: no   HYPERTENSION / HYPERLIPIDEMIA Takes Lipitor, Norvasc  & Lisinopril -HCTZ. CAD present on past imaging. Satisfied with current treatment? yes Duration of hypertension: chronic BP monitoring frequency: not checking BP range: not checking BP medication side effects: no Duration of hyperlipidemia: chronic Cholesterol medication side effects: no Cholesterol supplements: none Medication compliance: good compliance Aspirin: no Recent stressors: no Recurrent headaches: no Visual changes: no Palpitations: no Dyspnea: no Chest pain: no Lower extremity edema: no Dizzy/lightheaded: no    COPD Centrilobular and paraseptal emphysema + aortic atherosclerosis on lung screening 07/22/22. Did not attend 2025 screening, they did attempt to schedule with him.  Smokes 1/2 pack per day, used to smoke 1 PPD.  Not interested in quitting at this time.  Uses Anoro via  assistance program and finds benefit from this. COPD status: stable Satisfied with current treatment?: yes Oxygen use: no Dyspnea frequency: none Cough frequency: none Rescue inhaler frequency: no use Limitation of activity: no Productive cough: none Last Spirometry: 07/21/22 FEV1 79% and FEV1/FVC 98% Pneumovax: Not up to Date -- refuses Influenza: Not Up to Date -- refuses  PSA ELEVATION Saw urology on 04/19/23, watchful waiting.  He did not return for 3 month follow-up with them as instructed.  He reports some irritation to penis, which he has had before but is out of the spray he has used in past for this. Nocturia: 1/night Urinary frequency:no Incomplete voiding: no Urgency: no Weak urinary stream: no Straining to start stream: no IPSS Questionnaire (AUA-7): AUA = 2 Over the past month   1)  How often have you had a sensation of not emptying your bladder completely after you finish urinating?  0 - Not at all  2)  How often have you had to urinate again less than two hours after you finished urinating? 0 - Not at all  3)  How often have you found you stopped and started again several times when you urinated?  1 - Less than 1 time in 5  4) How difficult have you found it to postpone urination?  0 - Not at all  5) How often have you had a weak urinary stream?  0 - Not at all  6) How often have you had to push or strain to begin urination?  0 - Not at all  7) How many times did you most typically get up to urinate  from the time you went to bed until the time you got up in the morning?  1 - 1 time  Total score:  0-7 mildly symptomatic   8-19 moderately symptomatic   20-35 severely symptomatic     Relevant past medical, surgical, family and social history reviewed and updated as indicated. Interim medical history since our last visit reviewed. Allergies and medications reviewed and updated.  Review of Systems  Constitutional:  Negative for activity change, appetite change,  diaphoresis, fatigue and fever.  Respiratory:  Negative for cough, chest tightness, shortness of breath and wheezing.   Cardiovascular:  Negative for chest pain, palpitations and leg swelling.  Gastrointestinal: Negative.   Endocrine: Negative.   Genitourinary:  Negative for decreased urine volume, difficulty urinating, dysuria, frequency, penile discharge, penile pain, penile swelling and urgency.  Neurological: Negative.   Psychiatric/Behavioral: Negative.      Per HPI unless specifically indicated above     Objective:    BP 129/75 (BP Location: Left Arm, Patient Position: Sitting, Cuff Size: Normal)   Pulse 79   Temp 97.7 F (36.5 C) (Oral)   Resp 18   Ht 5' 8.9 (1.75 m)   Wt 193 lb (87.5 kg)   SpO2 94%   BMI 28.59 kg/m   Wt Readings from Last 3 Encounters:  05/13/24 193 lb (87.5 kg)  11/10/23 187 lb 3.2 oz (84.9 kg)  08/10/23 191 lb 3.2 oz (86.7 kg)    Physical Exam Vitals and nursing note reviewed. Exam conducted with a chaperone present.  Constitutional:      General: He is awake. He is not in acute distress.    Appearance: He is well-developed, well-groomed and overweight. He is not ill-appearing or toxic-appearing.  HENT:     Head: Normocephalic.     Right Ear: Hearing and external ear normal.     Left Ear: Hearing and external ear normal.  Eyes:     General: Lids are normal.     Extraocular Movements: Extraocular movements intact.     Conjunctiva/sclera: Conjunctivae normal.  Neck:     Thyroid : No thyromegaly.     Vascular: No carotid bruit.  Cardiovascular:     Rate and Rhythm: Normal rate and regular rhythm.     Heart sounds: Normal heart sounds. No murmur heard.    No gallop.  Pulmonary:     Effort: No accessory muscle usage or respiratory distress.     Breath sounds: Normal breath sounds. No decreased breath sounds, wheezing or rales.  Abdominal:     General: Bowel sounds are normal. There is no distension.     Palpations: Abdomen is soft.      Tenderness: There is no abdominal tenderness.  Genitourinary:    Penis: Uncircumcised. Erythema present. No tenderness or discharge.      Testes: Normal.     Comments: Penis with mild swelling and erythema present. No discharge noted. Musculoskeletal:     Cervical back: Full passive range of motion without pain.     Right lower leg: No edema.     Left lower leg: No edema.  Lymphadenopathy:     Cervical: No cervical adenopathy.  Skin:    General: Skin is warm.     Capillary Refill: Capillary refill takes less than 2 seconds.  Neurological:     Mental Status: He is alert and oriented to person, place, and time.     Deep Tendon Reflexes: Reflexes are normal and symmetric.     Reflex Scores:  Brachioradialis reflexes are 2+ on the right side and 2+ on the left side.      Patellar reflexes are 2+ on the right side and 2+ on the left side. Psychiatric:        Attention and Perception: Attention normal.        Mood and Affect: Mood normal.        Speech: Speech normal.        Behavior: Behavior normal. Behavior is cooperative.        Thought Content: Thought content normal.     Results for orders placed or performed in visit on 11/10/23  Bayer DCA Hb A1c Waived   Collection Time: 11/10/23  9:12 AM  Result Value Ref Range   HB A1C (BAYER DCA - WAIVED) 6.3 (H) 4.8 - 5.6 %  Comprehensive metabolic panel with GFR   Collection Time: 11/10/23  9:12 AM  Result Value Ref Range   Glucose 107 (H) 70 - 99 mg/dL   BUN 16 8 - 27 mg/dL   Creatinine, Ser 8.69 (H) 0.76 - 1.27 mg/dL   eGFR 57 (L) >40 fO/fpw/8.26   BUN/Creatinine Ratio 12 10 - 24   Sodium 138 134 - 144 mmol/L   Potassium 4.0 3.5 - 5.2 mmol/L   Chloride 100 96 - 106 mmol/L   CO2 24 20 - 29 mmol/L   Calcium  9.2 8.6 - 10.2 mg/dL   Total Protein 6.9 6.0 - 8.5 g/dL   Albumin 4.3 3.8 - 4.8 g/dL   Globulin, Total 2.6 1.5 - 4.5 g/dL   Bilirubin Total 0.3 0.0 - 1.2 mg/dL   Alkaline Phosphatase 113 44 - 121 IU/L   AST 16 0 - 40  IU/L   ALT 20 0 - 44 IU/L  Lipid Panel w/o Chol/HDL Ratio   Collection Time: 11/10/23  9:12 AM  Result Value Ref Range   Cholesterol, Total 118 100 - 199 mg/dL   Triglycerides 805 (H) 0 - 149 mg/dL   HDL 28 (L) >60 mg/dL   VLDL Cholesterol Cal 32 5 - 40 mg/dL   LDL Chol Calc (NIH) 58 0 - 99 mg/dL      Assessment & Plan:   Problem List Items Addressed This Visit       Cardiovascular and Mediastinum   Hypertension associated with diabetes (HCC)   Chronic, stable.  BP at goal today.  Recommend he start checking BP at home three mornings a week and documenting for provider.  Discussed goal BP with him.  Continue current medication regimen and adjust as needed.  LABS: CMP. Recommend complete cessation of smoking.  Refills sent.  Return in 6 months.      Relevant Medications   amLODipine  (NORVASC ) 5 MG tablet   Other Relevant Orders   Bayer DCA Hb A1c Waived   Coronary artery disease of native artery of native heart with stable angina pectoris   Chronic and stable with no recent CP.  Continue current medications and collaboration with cardiology as needed.      Relevant Medications   amLODipine  (NORVASC ) 5 MG tablet     Respiratory   Centrilobular emphysema (HCC)   Chronic, ongoing.  Continue to recommend complete smoking cessation. Continue Anoro daily as is offering benefit and lungs improved on exam.  March 2024 FEV1 59% and FEV1/FVC 97%.  Educated him at length on emphysema and progressive nature of disease.   Continue annual lung screening, he refuses to return at this time.  Spirometry next visit if  available in office.        Endocrine   Type 2 diabetes, diet controlled (HCC) - Primary   Chronic, stable with diet control.  A1c 6% today.  Continue diet focus.  Recommend he check blood sugar a few days a week at home and ensure healthy diet and regular exercise.   - Refuses Flu and PCV20 - Needs eye exam, foot up to date - ACE and statin on board.      Relevant Orders    Bayer DCA Hb A1c Waived   Hyperlipidemia associated with type 2 diabetes mellitus (HCC)   Chronic, ongoing.  Continue current medication regimen and adjust as needed.  Lipid panel today.   Return in 6 months.      Relevant Medications   amLODipine  (NORVASC ) 5 MG tablet   Other Relevant Orders   Bayer DCA Hb A1c Waived   Comprehensive metabolic panel with GFR   Lipid Panel w/o Chol/HDL Ratio     Genitourinary   Candidal balanitis   Acute.  Will refill his spray, which has offered benefit in past. Diflucan sent in for one dose this week and may repeat next week if not improved. Hold Atorvastatin  for 24 hours after taking Diflucan, educated him on this.      Relevant Medications   tolnaftate  (TINACTIN) 1 % spray   fluconazole (DIFLUCAN) 150 MG tablet     Other   Nicotine dependence, cigarettes, uncomplicated   Did not tolerate Wellbutrin .  I have recommended complete cessation of tobacco use. I have discussed various options available for assistance with tobacco cessation including over the counter methods (Nicotine gum, patch and lozenges). We also discussed prescription options (Chantix, Nicotine Inhaler / Nasal Spray). The patient is not interested in pursuing any prescription tobacco cessation options at this time. Continue annual screening until age 73, he refuses to return at this time.       Elevated PSA measurement   Ongoing, followed by urology, continue this collaboration.  Recent notes reviewed. He was to return beginning of 2025 but did not. Will recheck labs today and provided him number to call to return for visit with them. Minimal symptoms present.      Relevant Orders   PSA, total and free     Follow up plan: Return in about 6 months (around 11/11/2024) for Annual Physical after 11/10/23.      "

## 2024-05-13 NOTE — Assessment & Plan Note (Signed)
 Did not tolerate Wellbutrin .  I have recommended complete cessation of tobacco use. I have discussed various options available for assistance with tobacco cessation including over the counter methods (Nicotine gum, patch and lozenges). We also discussed prescription options (Chantix, Nicotine Inhaler / Nasal Spray). The patient is not interested in pursuing any prescription tobacco cessation options at this time. Continue annual screening until age 78, he refuses to return at this time.

## 2024-05-13 NOTE — Assessment & Plan Note (Signed)
 Chronic, stable with diet control.  A1c 6% today.  Continue diet focus.  Recommend he check blood sugar a few days a week at home and ensure healthy diet and regular exercise.   - Refuses Flu and PCV20 - Needs eye exam, foot up to date - ACE and statin on board.

## 2024-05-13 NOTE — Assessment & Plan Note (Signed)
 Chronic, ongoing.  Continue to recommend complete smoking cessation. Continue Anoro daily as is offering benefit and lungs improved on exam.  March 2024 FEV1 59% and FEV1/FVC 97%.  Educated him at length on emphysema and progressive nature of disease.   Continue annual lung screening, he refuses to return at this time.  Spirometry next visit if available in office.

## 2024-05-13 NOTE — Assessment & Plan Note (Signed)
 Ongoing, followed by urology, continue this collaboration.  Recent notes reviewed. He was to return beginning of 2025 but did not. Will recheck labs today and provided him number to call to return for visit with them. Minimal symptoms present.

## 2024-05-13 NOTE — Assessment & Plan Note (Signed)
 Chronic, stable.  BP at goal today.  Recommend he start checking BP at home three mornings a week and documenting for provider.  Discussed goal BP with him.  Continue current medication regimen and adjust as needed.  LABS: CMP. Recommend complete cessation of smoking.  Refills sent.  Return in 6 months.

## 2024-05-13 NOTE — Assessment & Plan Note (Signed)
 Acute.  Will refill his spray, which has offered benefit in past. Diflucan sent in for one dose this week and may repeat next week if not improved. Hold Atorvastatin  for 24 hours after taking Diflucan, educated him on this.

## 2024-05-14 ENCOUNTER — Ambulatory Visit: Payer: Self-pay | Admitting: Nurse Practitioner

## 2024-05-14 DIAGNOSIS — B3742 Candidal balanitis: Secondary | ICD-10-CM

## 2024-05-14 LAB — PSA, TOTAL AND FREE
PSA, Free Pct: 23.9 %
PSA, Free: 1.82 ng/mL
Prostate Specific Ag, Serum: 7.6 ng/mL — ABNORMAL HIGH (ref 0.0–4.0)

## 2024-05-14 LAB — COMPREHENSIVE METABOLIC PANEL WITH GFR
ALT: 19 IU/L (ref 0–44)
AST: 16 IU/L (ref 0–40)
Albumin: 4.3 g/dL (ref 3.8–4.8)
Alkaline Phosphatase: 107 IU/L (ref 47–123)
BUN/Creatinine Ratio: 15 (ref 10–24)
BUN: 20 mg/dL (ref 8–27)
Bilirubin Total: 0.4 mg/dL (ref 0.0–1.2)
CO2: 24 mmol/L (ref 20–29)
Calcium: 9.1 mg/dL (ref 8.6–10.2)
Chloride: 101 mmol/L (ref 96–106)
Creatinine, Ser: 1.31 mg/dL — ABNORMAL HIGH (ref 0.76–1.27)
Globulin, Total: 2.4 g/dL (ref 1.5–4.5)
Glucose: 108 mg/dL — ABNORMAL HIGH (ref 70–99)
Potassium: 3.9 mmol/L (ref 3.5–5.2)
Sodium: 139 mmol/L (ref 134–144)
Total Protein: 6.7 g/dL (ref 6.0–8.5)
eGFR: 56 mL/min/1.73 — ABNORMAL LOW

## 2024-05-14 LAB — LIPID PANEL W/O CHOL/HDL RATIO
Cholesterol, Total: 134 mg/dL (ref 100–199)
HDL: 29 mg/dL — ABNORMAL LOW
LDL Chol Calc (NIH): 68 mg/dL (ref 0–99)
Triglycerides: 226 mg/dL — ABNORMAL HIGH (ref 0–149)
VLDL Cholesterol Cal: 37 mg/dL (ref 5–40)

## 2024-05-14 NOTE — Progress Notes (Signed)
 Good morning, please let Tommy know his labs have returned: - Kidney function continues to show chronic kidney disease Stage 3a, we will continue to monitor at visits. Avoid Ibuprofen products and ensure good water intake daily. Liver function is normal. - Lipid panel is showing LDL close to goal, but triglycerides are still elevated. Are you taking your Atorvastatin  daily? If so I recommend we increase dose to 40 MG and stop 20 MG, let me know. - PSA, prostate lab, remains elevated. Please call urology ASAP and schedule follow-up as we discussed. You are past due and this is important due to ongoing elevation in level. Any questions? Keep being amazing!!  Thank you for allowing me to participate in your care.  I appreciate you. Kindest regards, Jrake Rodriquez

## 2024-05-27 MED ORDER — NYSTATIN-TRIAMCINOLONE 100000-0.1 UNIT/GM-% EX OINT: 1.0000 | TOPICAL_OINTMENT | Freq: Two times a day (BID) | CUTANEOUS | 1 refills | Status: AC

## 2024-05-27 NOTE — Telephone Encounter (Signed)
 Copied from CRM [id number]. Topic: Clinical - Medical Advice >> May 27, 2024 10:50 AM Gattis SQUIBB wrote: Reason for CRM: Pt called saying he thinks he needs something stronger for his yeast infection.  He took the medication and used the spray but he still has the infection.  He said it got a little better.  Please advise  [phone]

## 2024-08-06 ENCOUNTER — Ambulatory Visit

## 2024-11-11 ENCOUNTER — Encounter: Admitting: Nurse Practitioner
